# Patient Record
Sex: Female | Born: 1945
Health system: Southern US, Community
[De-identification: ages and names within clinical notes are randomized; demographics above are authoritative.]

## PROBLEM LIST (undated history)

## (undated) DIAGNOSIS — K219 Gastro-esophageal reflux disease without esophagitis: Secondary | ICD-10-CM

## (undated) DIAGNOSIS — M1990 Unspecified osteoarthritis, unspecified site: Secondary | ICD-10-CM

## (undated) DIAGNOSIS — E785 Hyperlipidemia, unspecified: Secondary | ICD-10-CM

## (undated) DIAGNOSIS — I10 Essential (primary) hypertension: Secondary | ICD-10-CM

## (undated) DIAGNOSIS — E538 Deficiency of other specified B group vitamins: Secondary | ICD-10-CM

## (undated) DIAGNOSIS — E119 Type 2 diabetes mellitus without complications: Secondary | ICD-10-CM

## (undated) DIAGNOSIS — E559 Vitamin D deficiency, unspecified: Secondary | ICD-10-CM

## (undated) HISTORY — DX: Vitamin D deficiency, unspecified: E55.9

## (undated) HISTORY — DX: Type 2 diabetes mellitus without complications: E11.9

## (undated) HISTORY — DX: Gastro-esophageal reflux disease without esophagitis: K21.9

## (undated) HISTORY — DX: Unspecified osteoarthritis, unspecified site: M19.90

## (undated) HISTORY — DX: Deficiency of other specified B group vitamins: E53.8

## (undated) HISTORY — DX: Hypercalcemia: E83.52

## (undated) HISTORY — DX: Essential (primary) hypertension: I10

## (undated) HISTORY — DX: Hyperlipidemia, unspecified: E78.5

---

## 1979-07-27 HISTORY — PX: ABDOMINAL HYSTERECTOMY: SHX81

## 1979-07-27 HISTORY — PX: APPENDECTOMY: SHX54

## 2000-07-26 HISTORY — PX: REPAIR DURAL / CSF LEAK: SUR1169

## 2011-10-26 ENCOUNTER — Ambulatory Visit: Payer: Self-pay | Admitting: Internal Medicine

## 2011-10-29 DIAGNOSIS — K219 Gastro-esophageal reflux disease without esophagitis: Secondary | ICD-10-CM | POA: Insufficient documentation

## 2011-10-29 DIAGNOSIS — M4310 Spondylolisthesis, site unspecified: Secondary | ICD-10-CM | POA: Insufficient documentation

## 2012-02-25 DIAGNOSIS — Z87821 Personal history of retained foreign body fully removed: Secondary | ICD-10-CM | POA: Insufficient documentation

## 2012-02-25 DIAGNOSIS — H6122 Impacted cerumen, left ear: Secondary | ICD-10-CM | POA: Insufficient documentation

## 2014-09-04 DIAGNOSIS — H2513 Age-related nuclear cataract, bilateral: Secondary | ICD-10-CM | POA: Diagnosis not present

## 2014-09-04 DIAGNOSIS — E119 Type 2 diabetes mellitus without complications: Secondary | ICD-10-CM | POA: Diagnosis not present

## 2014-09-04 DIAGNOSIS — H40033 Anatomical narrow angle, bilateral: Secondary | ICD-10-CM | POA: Diagnosis not present

## 2015-01-14 ENCOUNTER — Other Ambulatory Visit: Payer: Self-pay

## 2015-01-14 DIAGNOSIS — M19049 Primary osteoarthritis, unspecified hand: Secondary | ICD-10-CM | POA: Insufficient documentation

## 2015-01-14 DIAGNOSIS — E785 Hyperlipidemia, unspecified: Secondary | ICD-10-CM

## 2015-01-14 DIAGNOSIS — N951 Menopausal and female climacteric states: Secondary | ICD-10-CM | POA: Insufficient documentation

## 2015-01-14 DIAGNOSIS — E1165 Type 2 diabetes mellitus with hyperglycemia: Secondary | ICD-10-CM | POA: Insufficient documentation

## 2015-01-14 DIAGNOSIS — D510 Vitamin B12 deficiency anemia due to intrinsic factor deficiency: Secondary | ICD-10-CM | POA: Insufficient documentation

## 2015-01-14 DIAGNOSIS — E1169 Type 2 diabetes mellitus with other specified complication: Secondary | ICD-10-CM | POA: Insufficient documentation

## 2015-01-14 DIAGNOSIS — E559 Vitamin D deficiency, unspecified: Secondary | ICD-10-CM | POA: Insufficient documentation

## 2015-01-14 DIAGNOSIS — M6289 Other specified disorders of muscle: Secondary | ICD-10-CM | POA: Insufficient documentation

## 2015-01-14 DIAGNOSIS — I10 Essential (primary) hypertension: Secondary | ICD-10-CM | POA: Insufficient documentation

## 2015-01-14 DIAGNOSIS — G5600 Carpal tunnel syndrome, unspecified upper limb: Secondary | ICD-10-CM | POA: Insufficient documentation

## 2015-01-14 DIAGNOSIS — IMO0002 Reserved for concepts with insufficient information to code with codable children: Secondary | ICD-10-CM | POA: Insufficient documentation

## 2015-01-14 MED ORDER — MELOXICAM 15 MG PO TABS: 15.0000 mg | ORAL_TABLET | Freq: Every day | ORAL | Status: DC

## 2015-02-11 ENCOUNTER — Other Ambulatory Visit: Payer: Self-pay | Admitting: Internal Medicine

## 2015-02-11 MED ORDER — PRAVASTATIN SODIUM 20 MG PO TABS: 20.0000 mg | ORAL_TABLET | Freq: Every day | ORAL | Status: DC

## 2015-02-17 ENCOUNTER — Other Ambulatory Visit: Payer: Self-pay

## 2015-02-17 MED ORDER — EZETIMIBE 10 MG PO TABS: 10.0000 mg | ORAL_TABLET | Freq: Every day | ORAL | Status: DC

## 2015-02-18 ENCOUNTER — Other Ambulatory Visit: Payer: Self-pay | Admitting: Internal Medicine

## 2015-02-18 MED ORDER — EZETIMIBE 10 MG PO TABS: 10.0000 mg | ORAL_TABLET | Freq: Every day | ORAL | Status: DC

## 2015-03-14 ENCOUNTER — Ambulatory Visit: Payer: Self-pay | Admitting: Internal Medicine

## 2015-03-21 ENCOUNTER — Ambulatory Visit: Payer: Self-pay | Admitting: Internal Medicine

## 2015-03-21 DIAGNOSIS — Z1231 Encounter for screening mammogram for malignant neoplasm of breast: Secondary | ICD-10-CM | POA: Diagnosis not present

## 2015-04-01 ENCOUNTER — Encounter: Payer: Self-pay | Admitting: Internal Medicine

## 2015-04-04 ENCOUNTER — Ambulatory Visit: Payer: Self-pay | Admitting: Internal Medicine

## 2015-04-11 ENCOUNTER — Encounter: Payer: Self-pay | Admitting: Internal Medicine

## 2015-04-11 ENCOUNTER — Ambulatory Visit (INDEPENDENT_AMBULATORY_CARE_PROVIDER_SITE_OTHER): Payer: Medicare PPO | Admitting: Internal Medicine

## 2015-04-11 VITALS — BP 120/70 | HR 64 | Ht 61.5 in | Wt 184.0 lb

## 2015-04-11 DIAGNOSIS — E785 Hyperlipidemia, unspecified: Secondary | ICD-10-CM

## 2015-04-11 DIAGNOSIS — Z23 Encounter for immunization: Secondary | ICD-10-CM

## 2015-04-11 DIAGNOSIS — IMO0002 Reserved for concepts with insufficient information to code with codable children: Secondary | ICD-10-CM

## 2015-04-11 DIAGNOSIS — D510 Vitamin B12 deficiency anemia due to intrinsic factor deficiency: Secondary | ICD-10-CM | POA: Diagnosis not present

## 2015-04-11 DIAGNOSIS — I10 Essential (primary) hypertension: Secondary | ICD-10-CM

## 2015-04-11 DIAGNOSIS — M7070 Other bursitis of hip, unspecified hip: Secondary | ICD-10-CM

## 2015-04-11 DIAGNOSIS — E1165 Type 2 diabetes mellitus with hyperglycemia: Secondary | ICD-10-CM | POA: Diagnosis not present

## 2015-04-11 MED ORDER — CYANOCOBALAMIN 1000 MCG/ML IJ SOLN
1000.0000 ug | INTRAMUSCULAR | Status: DC
  Administered 2015-04-11: 1000 ug via INTRAMUSCULAR

## 2015-04-11 NOTE — Progress Notes (Signed)
Date:  04/11/2015   Name:  Sheila Sandoval   DOB:  May 25, 1946   MRN:  [id number]   Chief Complaint: Diabetes; Hypertension; and Hyperlipidemia Diabetes She presents for her follow-up diabetic visit. She has type 2 diabetes mellitus. Her disease course has been stable. Pertinent negatives for hypoglycemia include no speech difficulty. Pertinent negatives for diabetes include no blurred vision, no chest pain and no fatigue. Symptoms are stable. Current diabetic treatment includes oral agent (dual therapy). She is following a diabetic diet. An ACE inhibitor/angiotensin II receptor blocker is being taken.  Hypertension This is a chronic problem. The current episode started more than 1 year ago. The problem is unchanged. Pertinent negatives include no blurred vision, chest pain, palpitations or shortness of breath.  Hyperlipidemia This is a chronic problem. The problem is controlled. Recent lipid tests were reviewed and are normal. Associated symptoms include myalgias. Pertinent negatives include no chest pain, focal sensory loss, leg pain or shortness of breath.   Hip pain - patient complains of bilateral hip discomfort. On the lateral thigh just below the hip area. She is unable to sleep on either side. The area is tender to touch. It has not responded to anti-inflammatories. She denies any injury or unusual activities. She has not tried ice or heat.  Review of Systems:  Review of Systems  Constitutional: Negative for fever, fatigue and unexpected weight change.  HENT: Negative for hearing loss.   Eyes: Negative for blurred vision.  Respiratory: Negative for choking, shortness of breath and wheezing.   Cardiovascular: Negative for chest pain, palpitations and leg swelling.  Gastrointestinal: Negative for abdominal pain.  Genitourinary: Negative for dysuria and urgency.  Musculoskeletal: Positive for myalgias and arthralgias. Negative for joint swelling.  Skin: Negative for color change.   Neurological: Negative for speech difficulty, light-headedness and numbness.  Psychiatric/Behavioral: Negative for sleep disturbance.    Patient Active Problem List   Diagnosis Date Noted  . Carpal tunnel syndrome 01/14/2015  . Diabetes mellitus type 2, uncontrolled 01/14/2015  . HLD (hyperlipidemia) 01/14/2015  . Essential (primary) hypertension 01/14/2015  . Climacteric 01/14/2015  . Muscle fatigue 01/14/2015  . Addison anemia 01/14/2015  . Arthritis of hand, degenerative 01/14/2015  . Avitaminosis D 01/14/2015    Prior to Admission medications   Medication Sig Start Date End Date Taking? Authorizing Provider  amLODipine (NORVASC) 10 MG tablet Take 1 tablet by mouth daily. 11/15/14  Yes Historical Provider, MD  aspirin 81 MG tablet Take 1 tablet by mouth daily.   Yes Historical Provider, MD  cyanocobalamin (,VITAMIN B-12,) 1000 MCG/ML injection CYANOCOBALAMIN, 1000MCG/ML (Injection Solution)  1 Q3 mo for 0 days  Refills: 0   Ordered :29-Mar-2012  Alison Stalling ;  Started 29-Mar-2012 Active 03/29/12  Yes Historical Provider, MD  diclofenac sodium (VOLTAREN) 1 % GEL Place onto the skin. 04/05/14  Yes Historical Provider, MD  estradiol (ESTRACE) 0.5 MG tablet Take 1 tablet by mouth 3 (three) times a week. 11/22/14  Yes Historical Provider, MD  ezetimibe (ZETIA) 10 MG tablet Take 1 tablet (10 mg total) by mouth daily. 02/18/15  Yes Glean Hess, MD  Ferrous Gluconate 325 (36 FE) MG TABS Take by mouth.   Yes Historical Provider, MD  fluticasone (FLONASE) 50 MCG/ACT nasal spray Place into the nose. 04/05/14  Yes Historical Provider, MD  glimepiride (AMARYL) 2 MG tablet Take 1 tablet by mouth daily. 11/08/14  Yes Historical Provider, MD  glucose blood (ACCU-CHEK AVIVA PLUS) test strip ACCU-CHEK AVIVA PLUS (In Vitro  Strip)  1 (one) Strip Strip daily for 90 days  Quantity: 100;  Refills: 3   Ordered :19-Oct-2013  Halina Maidens M.D.;  Started 19-Oct-2013 Active Comments: dx: 250.02  10/19/13  Yes Historical Provider, MD  ibuprofen (ADVIL,MOTRIN) 800 MG tablet Take 1 tablet by mouth 3 (three) times daily. 08/14/13  Yes Historical Provider, MD  meloxicam (MOBIC) 15 MG tablet Take 1 tablet (15 mg total) by mouth daily. 01/14/15  Yes Glean Hess, MD  pravastatin (PRAVACHOL) 20 MG tablet Take 1 tablet (20 mg total) by mouth daily. 02/11/15  Yes Glean Hess, MD  ramipril (ALTACE) 5 MG capsule Take 1 tablet by mouth daily. 10/08/14  Yes Historical Provider, MD  sitaGLIPtin-metformin (JANUMET) 50-1000 MG per tablet Take 1 tablet by mouth 2 (two) times daily. 11/08/14  Yes Historical Provider, MD  triamterene-hydrochlorothiazide (MAXZIDE-25) 37.5-25 MG per tablet Take 1 tablet by mouth daily. 01/08/13  Yes Historical Provider, MD  ZOSTAVAX 69485 UNT/0.65ML injection  02/04/15   Historical Provider, MD    Allergies  Allergen Reactions  . Atorvastatin     chest tightness  . Tramadol Hcl Nausea And Vomiting    Past Surgical History  Procedure Laterality Date  . Repair dural / csf leak  2002  . Abdominal hysterectomy  1981    Social History  Substance Use Topics  . Smoking status: Never Smoker   . Smokeless tobacco: None  . Alcohol Use: No     Medication list has been reviewed and updated.  Physical Examination:  Physical Exam  Constitutional: She is oriented to person, place, and time. She appears well-developed and well-nourished. No distress.  HENT:  Head: Normocephalic and atraumatic.  Eyes: Conjunctivae are normal. Right eye exhibits no discharge. Left eye exhibits no discharge. No scleral icterus.  Neck: Normal range of motion. Neck supple. No thyromegaly present.  Cardiovascular: Normal rate, regular rhythm and normal heart sounds.   Pulmonary/Chest: Effort normal and breath sounds normal. No respiratory distress.  Musculoskeletal: Normal range of motion. She exhibits no edema or tenderness.  Tender over both lateral hips consistent with mild bursitis.  Range of motion of both hips is normal. Straight leg raise is negative bilaterally.  Neurological: She is alert and oriented to person, place, and time. She has normal reflexes.  Skin: Skin is warm and dry. No rash noted.  Psychiatric: She has a normal mood and affect. Her behavior is normal. Thought content normal.    BP 120/70 mmHg  Pulse 64  Ht 5' 1.5" (1.562 m)  Wt 184 lb (83.462 kg)  BMI 34.21 kg/m2  Assessment and Plan: 1. Essential (primary) hypertension Controlled continue current medication - Basic metabolic panel  2. Diabetes mellitus type 2, uncontrolled Continue oral agents will advise on adjustment if needed - Hemoglobin I6E - Basic metabolic panel  3. HLD (hyperlipidemia) Continue statin therapy  4. Pernicious anemia B12 injection is given today - cyanocobalamin ((VITAMIN B-12)) injection 1,000 mcg; Inject 1 mL (1,000 mcg total) into the muscle every 30 (thirty) days.  5. Need for influenza vaccination - Flu Vaccine QUAD 36+ mos IM  6. Bursitis, hip, unspecified laterality Continue ibuprofen daily Avoid sleeping on the side Use heat or ice for discomfort   Halina Maidens, MD [facility]  04/11/2015

## 2015-04-11 NOTE — Patient Instructions (Signed)
Hip Bursitis Bursitis is a swelling and soreness (inflammation) of a fluid-filled sac (bursa). This sac overlies and protects the joints.  CAUSES   Injury.  Overuse of the muscles surrounding the joint.  Arthritis.  Gout.  Infection.  Cold weather.  Inadequate warm-up and conditioning prior to activities. The cause may not be known.  SYMPTOMS   Mild to severe irritation.  Tenderness and swelling over the outside of the hip.  Pain with motion of the hip.  If the bursa becomes infected, a fever may be present. Redness, tenderness, and warmth will develop over the hip. Symptoms usually lessen in 3 to 4 weeks with treatment, but can come back. TREATMENT If conservative treatment does not work, your caregiver may advise draining the bursa and injecting cortisone into the area. This may speed up the healing process. This may also be used as an initial treatment of choice. HOME CARE INSTRUCTIONS   Apply ice to the affected area for 15-20 minutes every 3 to 4 hours while awake for the first 2 days. Put the ice in a plastic bag and place a towel between the bag of ice and your skin.  Rest the painful joint as much as possible, but continue to put the joint through a normal range of motion at least 4 times per day. When the pain lessens, begin normal, slow movements and usual activities to help prevent stiffness of the hip.  Only take over-the-counter or prescription medicines for pain, discomfort, or fever as directed by your caregiver.  Use crutches to limit weight bearing on the hip joint, if advised.  Elevate your painful hip to reduce swelling. Use pillows for propping and cushioning your legs and hips.  Gentle massage may provide comfort and decrease swelling. SEEK IMMEDIATE MEDICAL CARE IF:   Your pain increases even during treatment, or you are not improving.  You have a fever.  You have heat and inflammation over the involved bursa.  You have any other questions or  concerns. MAKE SURE YOU:   Understand these instructions.  Will watch your condition.  Will get help right away if you are not doing well or get worse. Document Released: 01/01/2002 Document Revised: 10/04/2011 Document Reviewed: 07/31/2008 ExitCare Patient Information 2015 [employer]. This information is not intended to replace advice given to you by your health care Abshir Paolini. Make sure you discuss any questions you have with your health care Donney Caraveo.  

## 2015-04-12 LAB — BASIC METABOLIC PANEL
BUN / CREAT RATIO: 13 (ref 11–26)
BUN: 13 mg/dL (ref 8–27)
CO2: 25 mmol/L (ref 18–29)
Calcium: 10.3 mg/dL (ref 8.7–10.3)
Chloride: 100 mmol/L (ref 97–108)
Creatinine, Ser: 0.99 mg/dL (ref 0.57–1.00)
GFR calc Af Amer: 67 mL/min/{1.73_m2} (ref >59)
GFR calc non Af Amer: 58 mL/min/{1.73_m2} — ABNORMAL LOW (ref >59)
GLUCOSE: 118 mg/dL — AB (ref 65–99)
POTASSIUM: 4.6 mmol/L (ref 3.5–5.2)
SODIUM: 141 mmol/L (ref 134–144)

## 2015-04-12 LAB — HEMOGLOBIN A1C
Est. average glucose Bld gHb Est-mCnc: 180 mg/dL
HEMOGLOBIN A1C: 7.9 % — AB (ref 4.8–5.6)

## 2015-04-29 ENCOUNTER — Other Ambulatory Visit: Payer: Self-pay

## 2015-04-29 MED ORDER — FLUTICASONE PROPIONATE 50 MCG/ACT NA SUSP: 1.0000 | Freq: Every day | NASAL | Status: DC

## 2015-05-08 ENCOUNTER — Other Ambulatory Visit: Payer: Self-pay | Admitting: Internal Medicine

## 2015-05-08 MED ORDER — ACCU-CHEK SOFTCLIX LANCET DEV MISC: Status: DC

## 2015-05-08 MED ORDER — ACCU-CHEK AVIVA PLUS W/DEVICE KIT
1.0000 | PACK | Freq: Two times a day (BID) | Status: DC
Start: 2015-05-08 — End: 2015-05-09

## 2015-05-08 MED ORDER — GLUCOSE BLOOD VI STRP: 1.0000 | ORAL_STRIP | Freq: Two times a day (BID) | Status: DC

## 2015-05-08 MED ORDER — ACCU-CHEK AVIVA VI SOLN: 1.0000 | Freq: Every day | Status: DC | PRN

## 2015-05-09 ENCOUNTER — Other Ambulatory Visit: Payer: Self-pay | Admitting: Internal Medicine

## 2015-05-09 MED ORDER — ACCU-CHEK SOFTCLIX LANCET DEV MISC: 1.0000 | Freq: Two times a day (BID) | Status: DC

## 2015-05-09 MED ORDER — GLUCOSE BLOOD VI STRP: 1.0000 | ORAL_STRIP | Freq: Two times a day (BID) | Status: DC

## 2015-05-09 MED ORDER — ACCU-CHEK AVIVA VI SOLN: 1.0000 | Freq: Every day | Status: DC | PRN

## 2015-05-09 MED ORDER — ACCU-CHEK AVIVA PLUS W/DEVICE KIT: 1.0000 | PACK | Freq: Two times a day (BID) | Status: DC

## 2015-05-27 DIAGNOSIS — H11423 Conjunctival edema, bilateral: Secondary | ICD-10-CM | POA: Diagnosis not present

## 2015-05-27 DIAGNOSIS — H1851 Endothelial corneal dystrophy: Secondary | ICD-10-CM | POA: Diagnosis not present

## 2015-05-30 DIAGNOSIS — D235 Other benign neoplasm of skin of trunk: Secondary | ICD-10-CM | POA: Diagnosis not present

## 2015-05-30 DIAGNOSIS — Z418 Encounter for other procedures for purposes other than remedying health state: Secondary | ICD-10-CM | POA: Diagnosis not present

## 2015-05-30 DIAGNOSIS — D485 Neoplasm of uncertain behavior of skin: Secondary | ICD-10-CM | POA: Diagnosis not present

## 2015-05-30 DIAGNOSIS — S80861A Insect bite (nonvenomous), right lower leg, initial encounter: Secondary | ICD-10-CM | POA: Diagnosis not present

## 2015-06-03 DIAGNOSIS — H40033 Anatomical narrow angle, bilateral: Secondary | ICD-10-CM | POA: Diagnosis not present

## 2015-06-03 DIAGNOSIS — H40031 Anatomical narrow angle, right eye: Secondary | ICD-10-CM | POA: Diagnosis not present

## 2015-06-03 DIAGNOSIS — H40032 Anatomical narrow angle, left eye: Secondary | ICD-10-CM | POA: Diagnosis not present

## 2015-06-03 DIAGNOSIS — H04123 Dry eye syndrome of bilateral lacrimal glands: Secondary | ICD-10-CM | POA: Diagnosis not present

## 2015-06-03 DIAGNOSIS — H1045 Other chronic allergic conjunctivitis: Secondary | ICD-10-CM | POA: Diagnosis not present

## 2015-06-25 DIAGNOSIS — H40033 Anatomical narrow angle, bilateral: Secondary | ICD-10-CM | POA: Diagnosis not present

## 2015-06-25 DIAGNOSIS — Z01818 Encounter for other preprocedural examination: Secondary | ICD-10-CM | POA: Diagnosis not present

## 2015-06-25 DIAGNOSIS — H40032 Anatomical narrow angle, left eye: Secondary | ICD-10-CM | POA: Diagnosis not present

## 2015-06-25 DIAGNOSIS — H40031 Anatomical narrow angle, right eye: Secondary | ICD-10-CM | POA: Diagnosis not present

## 2015-07-09 ENCOUNTER — Other Ambulatory Visit: Payer: Self-pay

## 2015-07-09 MED ORDER — TRIAMTERENE-HCTZ 37.5-25 MG PO TABS: 1.0000 | ORAL_TABLET | Freq: Every day | ORAL | Status: DC

## 2015-07-10 ENCOUNTER — Ambulatory Visit (INDEPENDENT_AMBULATORY_CARE_PROVIDER_SITE_OTHER): Payer: Medicare PPO

## 2015-07-10 DIAGNOSIS — E538 Deficiency of other specified B group vitamins: Secondary | ICD-10-CM | POA: Diagnosis not present

## 2015-07-10 DIAGNOSIS — H40031 Anatomical narrow angle, right eye: Secondary | ICD-10-CM | POA: Diagnosis not present

## 2015-07-10 DIAGNOSIS — H40033 Anatomical narrow angle, bilateral: Secondary | ICD-10-CM | POA: Diagnosis not present

## 2015-07-10 DIAGNOSIS — H40032 Anatomical narrow angle, left eye: Secondary | ICD-10-CM | POA: Diagnosis not present

## 2015-07-10 DIAGNOSIS — E119 Type 2 diabetes mellitus without complications: Secondary | ICD-10-CM | POA: Insufficient documentation

## 2015-07-10 MED ORDER — CYANOCOBALAMIN 1000 MCG/ML IJ SOLN
1000.0000 ug | Freq: Once | INTRAMUSCULAR | Status: AC
  Administered 2015-07-10: 1000 ug via INTRAMUSCULAR

## 2015-07-25 ENCOUNTER — Other Ambulatory Visit: Payer: Self-pay | Admitting: Internal Medicine

## 2015-07-25 MED ORDER — MELOXICAM 15 MG PO TABS: 15.0000 mg | ORAL_TABLET | Freq: Every day | ORAL | Status: DC

## 2015-07-29 ENCOUNTER — Other Ambulatory Visit: Payer: Self-pay | Admitting: Internal Medicine

## 2015-07-29 ENCOUNTER — Other Ambulatory Visit: Payer: Self-pay

## 2015-07-29 MED ORDER — MELOXICAM 15 MG PO TABS: 15.0000 mg | ORAL_TABLET | Freq: Every day | ORAL | Status: DC

## 2015-07-29 MED ORDER — TRIAMTERENE-HCTZ 37.5-25 MG PO TABS: 1.0000 | ORAL_TABLET | Freq: Every day | ORAL | Status: DC

## 2015-07-29 MED ORDER — ESTRADIOL 0.5 MG PO TABS: 0.5000 mg | ORAL_TABLET | ORAL | Status: DC

## 2015-07-30 DIAGNOSIS — H1851 Endothelial corneal dystrophy: Secondary | ICD-10-CM | POA: Diagnosis not present

## 2015-07-30 DIAGNOSIS — H40031 Anatomical narrow angle, right eye: Secondary | ICD-10-CM | POA: Diagnosis not present

## 2015-07-30 DIAGNOSIS — H40032 Anatomical narrow angle, left eye: Secondary | ICD-10-CM | POA: Diagnosis not present

## 2015-07-30 DIAGNOSIS — H2513 Age-related nuclear cataract, bilateral: Secondary | ICD-10-CM | POA: Diagnosis not present

## 2015-07-30 DIAGNOSIS — H269 Unspecified cataract: Secondary | ICD-10-CM | POA: Diagnosis not present

## 2015-07-30 DIAGNOSIS — H40033 Anatomical narrow angle, bilateral: Secondary | ICD-10-CM | POA: Diagnosis not present

## 2015-08-01 DIAGNOSIS — Q019 Encephalocele, unspecified: Secondary | ICD-10-CM | POA: Insufficient documentation

## 2015-08-06 ENCOUNTER — Other Ambulatory Visit: Payer: Self-pay

## 2015-08-06 MED ORDER — DICLOFENAC SODIUM 1 % TD GEL: 2.0000 g | Freq: Four times a day (QID) | TRANSDERMAL | Status: DC

## 2015-08-06 NOTE — Telephone Encounter (Signed)
Received fax from pharmacy requesting medication.  

## 2015-08-14 DIAGNOSIS — H40031 Anatomical narrow angle, right eye: Secondary | ICD-10-CM | POA: Diagnosis not present

## 2015-08-14 DIAGNOSIS — H2513 Age-related nuclear cataract, bilateral: Secondary | ICD-10-CM | POA: Diagnosis not present

## 2015-08-14 DIAGNOSIS — H40032 Anatomical narrow angle, left eye: Secondary | ICD-10-CM | POA: Diagnosis not present

## 2015-08-14 DIAGNOSIS — H1851 Endothelial corneal dystrophy: Secondary | ICD-10-CM | POA: Diagnosis not present

## 2015-08-14 DIAGNOSIS — H40033 Anatomical narrow angle, bilateral: Secondary | ICD-10-CM | POA: Diagnosis not present

## 2015-08-15 ENCOUNTER — Encounter: Payer: Medicare PPO | Admitting: Internal Medicine

## 2015-09-01 ENCOUNTER — Other Ambulatory Visit: Payer: Self-pay | Admitting: Internal Medicine

## 2015-09-01 MED ORDER — EZETIMIBE 10 MG PO TABS: 10.0000 mg | ORAL_TABLET | Freq: Every day | ORAL | Status: DC

## 2015-09-01 MED ORDER — PRAVASTATIN SODIUM 20 MG PO TABS: 20.0000 mg | ORAL_TABLET | Freq: Every day | ORAL | Status: DC

## 2015-09-09 DIAGNOSIS — E119 Type 2 diabetes mellitus without complications: Secondary | ICD-10-CM | POA: Diagnosis not present

## 2015-09-09 DIAGNOSIS — Z79899 Other long term (current) drug therapy: Secondary | ICD-10-CM | POA: Diagnosis not present

## 2015-09-09 DIAGNOSIS — Z1211 Encounter for screening for malignant neoplasm of colon: Secondary | ICD-10-CM | POA: Diagnosis not present

## 2015-09-09 DIAGNOSIS — D125 Benign neoplasm of sigmoid colon: Secondary | ICD-10-CM | POA: Diagnosis not present

## 2015-09-09 DIAGNOSIS — D126 Benign neoplasm of colon, unspecified: Secondary | ICD-10-CM | POA: Diagnosis not present

## 2015-09-09 LAB — HM COLONOSCOPY

## 2015-09-25 DIAGNOSIS — H40031 Anatomical narrow angle, right eye: Secondary | ICD-10-CM | POA: Diagnosis not present

## 2015-09-25 DIAGNOSIS — H40033 Anatomical narrow angle, bilateral: Secondary | ICD-10-CM | POA: Diagnosis not present

## 2015-09-25 DIAGNOSIS — H2513 Age-related nuclear cataract, bilateral: Secondary | ICD-10-CM | POA: Diagnosis not present

## 2015-09-25 DIAGNOSIS — H40032 Anatomical narrow angle, left eye: Secondary | ICD-10-CM | POA: Diagnosis not present

## 2015-10-23 ENCOUNTER — Encounter: Payer: Self-pay | Admitting: Internal Medicine

## 2015-10-23 ENCOUNTER — Ambulatory Visit (INDEPENDENT_AMBULATORY_CARE_PROVIDER_SITE_OTHER): Payer: Medicare PPO | Admitting: Internal Medicine

## 2015-10-23 VITALS — BP 136/64 | HR 72 | Ht 61.5 in | Wt 183.0 lb

## 2015-10-23 DIAGNOSIS — E785 Hyperlipidemia, unspecified: Secondary | ICD-10-CM | POA: Diagnosis not present

## 2015-10-23 DIAGNOSIS — E1100 Type 2 diabetes mellitus with hyperosmolarity without nonketotic hyperglycemic-hyperosmolar coma (NKHHC): Secondary | ICD-10-CM | POA: Diagnosis not present

## 2015-10-23 DIAGNOSIS — Z0000 Encounter for general adult medical examination without abnormal findings: Secondary | ICD-10-CM

## 2015-10-23 DIAGNOSIS — I10 Essential (primary) hypertension: Secondary | ICD-10-CM | POA: Diagnosis not present

## 2015-10-23 DIAGNOSIS — D510 Vitamin B12 deficiency anemia due to intrinsic factor deficiency: Secondary | ICD-10-CM

## 2015-10-23 DIAGNOSIS — R1900 Intra-abdominal and pelvic swelling, mass and lump, unspecified site: Secondary | ICD-10-CM | POA: Diagnosis not present

## 2015-10-23 MED ORDER — CYANOCOBALAMIN 1000 MCG/ML IJ SOLN
1000.0000 ug | INTRAMUSCULAR | Status: DC
  Administered 2015-10-23 – 2015-12-05 (×2): 1000 ug via INTRAMUSCULAR

## 2015-10-23 NOTE — Progress Notes (Signed)
Patient: Sheila Sandoval, Female    DOB: [birth year], 70 y.o.   MRN: [id number] Visit Date: 10/23/2015  Today's Provider: Halina Maidens, MD   Chief Complaint  Patient presents with  . Medicare Wellness   Subjective:    Annual wellness visit [name] is a 70 y.o. female who presents today for her Subsequent Annual Wellness Visit. She feels well. She reports exercising at home with treadmill and twist board. She reports she is sleeping well. Her mammogram was done in August. She has not had a pelvic exam in several years - denies problems.  ----------------------------------------------------------- Diabetes Pertinent negatives for hypoglycemia include no dizziness, headaches, nervousness/anxiousness or tremors. Pertinent negatives for diabetes include no chest pain, no fatigue, no polydipsia and no polyuria. Current diabetic treatment includes oral agent (triple therapy). She is compliant with treatment most of the time. Her breakfast blood glucose is taken between 5-6 am. Her breakfast blood glucose range is generally 110-130 mg/dl.  Hypertension This is a chronic problem. The current episode started more than 1 year ago. The problem is unchanged. The problem is controlled. Pertinent negatives include no chest pain, headaches, palpitations or shortness of breath.  Hyperlipidemia This is a chronic problem. The current episode started more than 1 year ago. The problem is controlled. Recent lipid tests were reviewed and are normal. Pertinent negatives include no chest pain or shortness of breath. Current antihyperlipidemic treatment includes statins and ezetimibe. The current treatment provides significant improvement of lipids.   Lab Results  Component Value Date   HGBA1C 7.9* 04/11/2015    Review of Systems  Constitutional: Negative for fever, chills and fatigue.  HENT: Negative for congestion, hearing loss, tinnitus, trouble swallowing and voice change.   Eyes:  Negative for visual disturbance.  Respiratory: Negative for cough, chest tightness, shortness of breath and wheezing.   Cardiovascular: Negative for chest pain, palpitations and leg swelling.  Gastrointestinal: Negative for vomiting, abdominal pain, diarrhea and constipation.  Endocrine: Negative for polydipsia and polyuria.  Genitourinary: Negative for dysuria, frequency, vaginal bleeding, vaginal discharge, genital sores and vaginal pain.  Musculoskeletal: Positive for arthralgias. Negative for joint swelling and gait problem.  Skin: Negative for color change and rash.  Neurological: Negative for dizziness, tremors, light-headedness and headaches.  Hematological: Negative for adenopathy. Does not bruise/bleed easily.  Psychiatric/Behavioral: Negative for sleep disturbance and dysphoric mood. The patient is not nervous/anxious.     Social History   Social History  . Marital Status: Single    Spouse Name: N/A  . Number of Children: N/A  . Years of Education: N/A   Occupational History  . Not on file.   Social History Main Topics  . Smoking status: Never Smoker   . Smokeless tobacco: Not on file  . Alcohol Use: No  . Drug Use: No  . Sexual Activity: Not on file   Other Topics Concern  . Not on file   Social History Narrative    Patient Active Problem List   Diagnosis Date Noted  . Type 2 diabetes mellitus ([place]) 07/10/2015  . Pernicious anemia 04/11/2015  . Carpal tunnel syndrome 01/14/2015  . Diabetes mellitus type 2, uncontrolled ([place]) 01/14/2015  . HLD (hyperlipidemia) 01/14/2015  . Essential (primary) hypertension 01/14/2015  . Climacteric 01/14/2015  . Muscle fatigue 01/14/2015  . Addison anemia 01/14/2015  . Arthritis of hand, degenerative 01/14/2015  . Avitaminosis D 01/14/2015    Past Surgical History  Procedure Laterality Date  . Repair dural / csf leak  2002  .  Abdominal hysterectomy  1981  . Appendectomy  1981    Her family history includes Cancer  in her father; Diabetes in her mother.    Previous Medications   AMLODIPINE (NORVASC) 10 MG TABLET    Take 1 tablet by mouth daily.   ASPIRIN 81 MG TABLET    Take 1 tablet by mouth daily.   BLOOD GLUCOSE CALIBRATION (ACCU-CHEK AVIVA) SOLN    1 each by In Vitro route daily as needed.   BLOOD GLUCOSE MONITORING SUPPL (ACCU-CHEK AVIVA PLUS) W/DEVICE KIT    1 each by Does not apply route 2 (two) times daily.   CYANOCOBALAMIN (,VITAMIN B-12,) 1000 MCG/ML INJECTION    CYANOCOBALAMIN, 1000MCG/ML (Injection Solution)  1 Q3 mo for 0 days  Refills: 0   Ordered :29-Mar-2012  Alison Stalling ;  Started 29-Mar-2012 Active   DICLOFENAC SODIUM (VOLTAREN) 1 % GEL    Apply 2 g topically 4 (four) times daily.   ESTRADIOL (ESTRACE) 0.5 MG TABLET    Take 1 tablet (0.5 mg total) by mouth 3 (three) times a week.   EZETIMIBE (ZETIA) 10 MG TABLET    Take 1 tablet (10 mg total) by mouth daily.   FERROUS GLUCONATE 325 (36 FE) MG TABS    Take by mouth.   FLUTICASONE (FLONASE) 50 MCG/ACT NASAL SPRAY    Place 1 spray into both nostrils daily.   GLIMEPIRIDE (AMARYL) 2 MG TABLET    Take 1 tablet by mouth daily.   GLUCOSE BLOOD (ACCU-CHEK AVIVA PLUS) TEST STRIP    1 each by Other route 2 (two) times daily.   IBUPROFEN (ADVIL,MOTRIN) 800 MG TABLET    Take 1 tablet by mouth 3 (three) times daily.   LANCET DEVICES (ACCU-CHEK SOFTCLIX) LANCETS    1 each by Other route 2 (two) times daily.   MELOXICAM (MOBIC) 15 MG TABLET    Take 1 tablet (15 mg total) by mouth daily.   PRAVASTATIN (PRAVACHOL) 20 MG TABLET    Take 1 tablet (20 mg total) by mouth daily.   RAMIPRIL (ALTACE) 5 MG CAPSULE    Take 1 tablet by mouth daily.   SITAGLIPTIN-METFORMIN (JANUMET) 50-1000 MG PER TABLET    Take 1 tablet by mouth 2 (two) times daily.   TRIAMTERENE-HYDROCHLOROTHIAZIDE (MAXZIDE-25) 37.5-25 MG TABLET    Take 1 tablet by mouth daily.    Patient Care Team: Glean Hess, MD as PCP - General (Family Medicine)     Objective:   Vitals: BP  136/64 mmHg  Pulse 72  Ht 5' 1.5" (1.562 m)  Wt 183 lb (83.008 kg)  BMI 34.02 kg/m2  Physical Exam  Constitutional: She is oriented to person, place, and time. She appears well-developed and well-nourished. No distress.  HENT:  Head: Normocephalic and atraumatic.  Right Ear: Tympanic membrane and ear canal normal.  Left Ear: Tympanic membrane and ear canal normal.  Nose: Right sinus exhibits no maxillary sinus tenderness. Left sinus exhibits no maxillary sinus tenderness.  Mouth/Throat: Uvula is midline and oropharynx is clear and moist.  Cerumen impaction bilaterally  Eyes: Conjunctivae and EOM are normal. Right eye exhibits no discharge. Left eye exhibits no discharge. No scleral icterus.  Neck: Normal range of motion. Carotid bruit is not present. No erythema present. No thyromegaly present.  Cardiovascular: Normal rate, regular rhythm, normal heart sounds and normal pulses.   Pulmonary/Chest: Effort normal. No respiratory distress. She has no wheezes. Right breast exhibits no mass, no nipple discharge, no skin change and no tenderness.  Left breast exhibits no mass, no nipple discharge, no skin change and no tenderness.  Abdominal: Soft. Bowel sounds are normal. There is no hepatosplenomegaly. There is no tenderness. There is no CVA tenderness.  Genitourinary: Vagina normal. There is no tenderness, lesion or injury on the right labia. There is no tenderness, lesion or injury on the left labia. Right adnexum displays no mass, no tenderness and no fullness. Left adnexum displays no mass, no tenderness and no fullness.  Cervix and uterus absent but firm midline mass ~2 cm palpated but not visualized on speculum exam  Musculoskeletal: Normal range of motion.  Lymphadenopathy:    She has no cervical adenopathy.    She has no axillary adenopathy.  Neurological: She is alert and oriented to person, place, and time. She has normal reflexes. No cranial nerve deficit or sensory deficit.  Skin:  Skin is warm, dry and intact. No rash noted.  Foot exam completed - normal with normal pulses, sensation and skin appearance.  Small left bunion.  Psychiatric: She has a normal mood and affect. Her speech is normal and behavior is normal. Thought content normal.  Nursing note and vitals reviewed.   Activities of Daily Living In your present state of health, do you have any difficulty performing the following activities: 04/11/2015  Hearing? Y  Vision? N  Difficulty concentrating or making decisions? N  Walking or climbing stairs? N  Dressing or bathing? N  Doing errands, shopping? N    Fall Risk Assessment Fall Risk  04/11/2015  Falls in the past year? No      Depression Screen PHQ 2/9 Scores 04/11/2015  PHQ - 2 Score 0    Cognitive Testing - 6-CIT   Correct? Score   What year is it? yes 0 Yes = 0    No = 4  What month is it? yes 0 Yes = 0    No = 3  Remember:     [address], [place], [place]     What time is it? yes 0 Yes = 0    No = 3  Count backwards from 20 to 1 yes 0 Correct = 0    1 error = 2   More than 1 error = 4  Say the months of the year in reverse. yes 0 Correct = 0    1 error = 2   More than 1 error = 4  What address did I ask you to remember? yes 0 Correct = 0  1 error = 2    2 error = 4    3 error = 6    4 error = 8    All wrong = 10       TOTAL SCORE  0/28   Interpretation:  Normal  Normal (0-7) Abnormal (8-28)        Medicare Annual Wellness Visit Summary:  Reviewed patient's Family Medical History Reviewed and updated list of patient's medical providers Assessment of cognitive impairment was done Assessed patient's functional ability Established a written schedule for health screening [place] Completed and Reviewed  Exercise Activities and Dietary recommendations Goals    None      Immunization History  Administered Date(s) Administered  . Influenza,inj,Quad PF,36+ Mos 04/11/2015  . Pneumococcal  Conjugate-13 08/02/2014  . Pneumococcal-Unspecified 07/26/2000, 09/04/2010  . Tdap 09/04/2010    Health Maintenance  Topic Date Due  . Hepatitis C Screening  January 21, 1946  . FOOT EXAM  02/28/1956  .  OPHTHALMOLOGY EXAM  02/28/1956  . COLONOSCOPY  02/28/1996  . DEXA SCAN  02/28/2011  . PNA vac Low Risk Adult (2 of 2 - PPSV23) 09/05/2015  . HEMOGLOBIN A1C  10/09/2015  . INFLUENZA VACCINE  02/24/2016  . MAMMOGRAM  03/20/2017  . TETANUS/TDAP  09/04/2020  . ZOSTAVAX  Addressed     Discussed health benefits of physical activity, and encouraged her to engage in regular exercise appropriate for her age and condition.    ------------------------------------------------------------------------------------------------------------   Assessment & Plan:  1. Medicare annual wellness visit, subsequent Patient will schedule Mammogram in August MAW measures satisfied - POCT urinalysis dipstick  2. Essential (primary) hypertension controlled - CBC with Differential/Platelet  3. Uncontrolled type 2 diabetes mellitus with hyperosmolarity without coma, without long-term current use of insulin ([place]) Continue triple therapy - will advise if med adjustment needed - Microalbumin / creatinine urine ratio - Comprehensive metabolic panel - TSH - Hemoglobin A1c  4. HLD (hyperlipidemia) On appropriate statin therapy - Lipid panel  5. Pelvic mass in female Pelvic US ordered at [facility]  6. Colon cancer screening Recent Colonoscopy and virtual CT colonoscopy done  7. Pernicious anemia - cyanocobalamin ((VITAMIN B-12)) injection 1,000 mcg; Inject 1 mL (1,000 mcg total) into the muscle every 30 (thirty) days.   Halina Maidens, MD [facility]  10/23/2015

## 2015-10-23 NOTE — Patient Instructions (Addendum)
Health Maintenance  Topic Date Due  . Hepatitis C Screening  Sep 07, 1945  . FOOT EXAM  02/28/1956  . OPHTHALMOLOGY EXAM  02/28/1956  . COLONOSCOPY  02/28/1996  . DEXA SCAN  02/28/2011  . PNA vac Low Risk Adult (2 of 2 - PPSV23) 09/05/2015  . HEMOGLOBIN A1C  10/09/2015  . INFLUENZA VACCINE  02/24/2016  . MAMMOGRAM  03/20/2017  . TETANUS/TDAP  09/04/2020  . ZOSTAVAX  Addressed   Breast Self-Awareness Practicing breast self-awareness may pick up problems early, prevent significant medical complications, and possibly save your life. By practicing breast self-awareness, you can become familiar with how your breasts look and feel and if your breasts are changing. This allows you to notice changes early. It can also offer you some reassurance that your breast health is good. One way to learn what is normal for your breasts and whether your breasts are changing is to do a breast self-exam. If you find a lump or something that was not present in the past, it is best to contact your caregiver right away. Other findings that should be evaluated by your caregiver include nipple discharge, especially if it is bloody; skin changes or reddening; areas where the skin seems to be pulled in (retracted); or new lumps and bumps. Breast pain is seldom associated with cancer (malignancy), but should also be evaluated by a caregiver. HOW TO PERFORM A BREAST SELF-EXAM The best time to examine your breasts is 5-7 days after your menstrual period is over. During menstruation, the breasts are lumpier, and it may be more difficult to pick up changes. If you do not menstruate, have reached menopause, or had your uterus removed (hysterectomy), you should examine your breasts at regular intervals, such as monthly. If you are breastfeeding, examine your breasts after a feeding or after using a breast pump. Breast implants do not decrease the risk for lumps or tumors, so continue to perform breast self-exams as recommended. Talk to  your caregiver about how to determine the difference between the implant and breast tissue. Also, talk about the amount of pressure you should use during the exam. Over time, you will become more familiar with the variations of your breasts and more comfortable with the exam. A breast self-exam requires you to remove all your clothes above the waist. 1. Look at your breasts and nipples. Stand in front of a mirror in a room with good lighting. With your hands on your hips, push your hands firmly downward. Look for a difference in shape, contour, and size from one breast to the other (asymmetry). Asymmetry includes puckers, dips, or bumps. Also, look for skin changes, such as reddened or scaly areas on the breasts. Look for nipple changes, such as discharge, dimpling, repositioning, or redness. 2. Carefully feel your breasts. This is best done either in the shower or tub while using soapy water or when flat on your back. Place the arm (on the side of the breast you are examining) above your head. Use the pads (not the fingertips) of your three middle fingers on your opposite hand to feel your breasts. Start in the underarm area and use  inch (2 cm) overlapping circles to feel your breast. Use 3 different levels of pressure (light, medium, and firm pressure) at each circle before moving to the next circle. The light pressure is needed to feel the tissue closest to the skin. The medium pressure will help to feel breast tissue a little deeper, while the firm pressure is needed to  feel the tissue close to the ribs. Continue the overlapping circles, moving downward over the breast until you feel your ribs below your breast. Then, move one finger-width towards the center of the body. Continue to use the  inch (2 cm) overlapping circles to feel your breast as you move slowly up toward the collar bone (clavicle) near the base of the neck. Continue the up and down exam using all 3 pressures until you reach the middle of  the chest. Do this with each breast, carefully feeling for lumps or changes. 3.  Keep a written record with breast changes or normal findings for each breast. By writing this information down, you do not need to depend only on memory for size, tenderness, or location. Write down where you are in your menstrual cycle, if you are still menstruating. Breast tissue can have some lumps or thick tissue. However, see your caregiver if you find anything that concerns you.  SEEK MEDICAL CARE IF:  You see a change in shape, contour, or size of your breasts or nipples.   You see skin changes, such as reddened or scaly areas on the breasts or nipples.   You have an unusual discharge from your nipples.   You feel a new lump or unusually thick areas.    This information is not intended to replace advice given to you by your health care provider. Make sure you discuss any questions you have with your health care provider.   Document Released: 07/12/2005 Document Revised: 06/28/2012 Document Reviewed: 10/27/2011 Elsevier Interactive Patient Education [employer].

## 2015-10-24 ENCOUNTER — Telehealth: Payer: Self-pay

## 2015-10-24 ENCOUNTER — Other Ambulatory Visit: Payer: Self-pay | Admitting: Internal Medicine

## 2015-10-24 LAB — COMPREHENSIVE METABOLIC PANEL
A/G RATIO: 1.8 (ref 1.2–2.2)
ALK PHOS: 72 IU/L (ref 39–117)
ALT: 16 IU/L (ref 0–32)
AST: 20 IU/L (ref 0–40)
Albumin: 4.5 g/dL (ref 3.6–4.8)
BILIRUBIN TOTAL: 0.3 mg/dL (ref 0.0–1.2)
BUN/Creatinine Ratio: 16 (ref 11–26)
BUN: 15 mg/dL (ref 8–27)
CALCIUM: 10.1 mg/dL (ref 8.7–10.3)
CHLORIDE: 100 mmol/L (ref 96–106)
CO2: 24 mmol/L (ref 18–29)
Creatinine, Ser: 0.92 mg/dL (ref 0.57–1.00)
GFR calc Af Amer: 73 mL/min/{1.73_m2} (ref >59)
GFR calc non Af Amer: 64 mL/min/{1.73_m2} (ref >59)
Globulin, Total: 2.5 g/dL (ref 1.5–4.5)
Glucose: 125 mg/dL — ABNORMAL HIGH (ref 65–99)
POTASSIUM: 4.5 mmol/L (ref 3.5–5.2)
Sodium: 142 mmol/L (ref 134–144)
Total Protein: 7 g/dL (ref 6.0–8.5)

## 2015-10-24 LAB — LIPID PANEL
CHOLESTEROL TOTAL: 139 mg/dL (ref 100–199)
Chol/HDL Ratio: 2.3 ratio units (ref 0.0–4.4)
HDL: 60 mg/dL (ref >39)
LDL Calculated: 65 mg/dL (ref 0–99)
TRIGLYCERIDES: 71 mg/dL (ref 0–149)
VLDL Cholesterol Cal: 14 mg/dL (ref 5–40)

## 2015-10-24 LAB — CBC WITH DIFFERENTIAL/PLATELET
BASOS: 1 %
Basophils Absolute: 0 10*3/uL (ref 0.0–0.2)
EOS (ABSOLUTE): 0.1 10*3/uL (ref 0.0–0.4)
Eos: 2 %
Hematocrit: 36.6 % (ref 34.0–46.6)
Hemoglobin: 11.3 g/dL (ref 11.1–15.9)
IMMATURE GRANULOCYTES: 0 %
Immature Grans (Abs): 0 10*3/uL (ref 0.0–0.1)
Lymphocytes Absolute: 2.2 10*3/uL (ref 0.7–3.1)
Lymphs: 35 %
MCH: 22.2 pg — ABNORMAL LOW (ref 26.6–33.0)
MCHC: 30.9 g/dL — AB (ref 31.5–35.7)
MCV: 72 fL — AB (ref 79–97)
MONOS ABS: 0.3 10*3/uL (ref 0.1–0.9)
Monocytes: 5 %
NEUTROS PCT: 57 %
Neutrophils Absolute: 3.6 10*3/uL (ref 1.4–7.0)
PLATELETS: 297 10*3/uL (ref 150–379)
RBC: 5.08 x10E6/uL (ref 3.77–5.28)
RDW: 16 % — AB (ref 12.3–15.4)
WBC: 6.2 10*3/uL (ref 3.4–10.8)

## 2015-10-24 LAB — HEMOGLOBIN A1C
ESTIMATED AVERAGE GLUCOSE: 166 mg/dL
Hgb A1c MFr Bld: 7.4 % — ABNORMAL HIGH (ref 4.8–5.6)

## 2015-10-24 LAB — TSH: TSH: 2.11 u[IU]/mL (ref 0.450–4.500)

## 2015-10-24 MED ORDER — RAMIPRIL 5 MG PO CAPS: 5.0000 mg | ORAL_CAPSULE | Freq: Every day | ORAL | Status: DC

## 2015-10-24 NOTE — Telephone Encounter (Signed)
Spoke with patient. Patient advised of all results and verbalized understanding. Will call back with any future questions or concerns. MAH  

## 2015-10-24 NOTE — Telephone Encounter (Signed)
-----   Message from Glean Hess, MD sent at 10/24/2015  8:00 AM EDT ----- DM is improved.  All other labs are normal.  Continue same medication.

## 2015-10-31 DIAGNOSIS — Z90710 Acquired absence of both cervix and uterus: Secondary | ICD-10-CM | POA: Diagnosis not present

## 2015-10-31 DIAGNOSIS — R1909 Other intra-abdominal and pelvic swelling, mass and lump: Secondary | ICD-10-CM | POA: Diagnosis not present

## 2015-11-03 ENCOUNTER — Encounter: Payer: Self-pay | Admitting: Internal Medicine

## 2015-11-03 ENCOUNTER — Telehealth: Payer: Self-pay | Admitting: Internal Medicine

## 2015-11-03 NOTE — Telephone Encounter (Signed)
Please call patient to report that her ultrasound is normal.  There is no ovarian abnormality.  No further testing needs to be done.

## 2015-11-03 NOTE — Telephone Encounter (Signed)
Advised patient of results.  

## 2015-12-05 ENCOUNTER — Ambulatory Visit (INDEPENDENT_AMBULATORY_CARE_PROVIDER_SITE_OTHER): Payer: Medicare PPO

## 2015-12-05 VITALS — Resp 16 | Ht 61.5 in | Wt 183.0 lb

## 2015-12-05 DIAGNOSIS — E538 Deficiency of other specified B group vitamins: Secondary | ICD-10-CM

## 2015-12-05 DIAGNOSIS — H6123 Impacted cerumen, bilateral: Secondary | ICD-10-CM | POA: Diagnosis not present

## 2015-12-09 ENCOUNTER — Other Ambulatory Visit: Payer: Self-pay | Admitting: Internal Medicine

## 2015-12-09 MED ORDER — AMLODIPINE BESYLATE 10 MG PO TABS: 10.0000 mg | ORAL_TABLET | Freq: Every day | ORAL | Status: DC

## 2016-01-09 ENCOUNTER — Ambulatory Visit (INDEPENDENT_AMBULATORY_CARE_PROVIDER_SITE_OTHER): Payer: Medicare PPO | Admitting: Internal Medicine

## 2016-01-09 ENCOUNTER — Encounter: Payer: Self-pay | Admitting: Internal Medicine

## 2016-01-09 VITALS — BP 136/82 | HR 84 | Temp 98.1°F | Resp 16 | Ht 61.5 in | Wt 184.0 lb

## 2016-01-09 DIAGNOSIS — E1100 Type 2 diabetes mellitus with hyperosmolarity without nonketotic hyperglycemic-hyperosmolar coma (NKHHC): Secondary | ICD-10-CM

## 2016-01-09 DIAGNOSIS — M5432 Sciatica, left side: Secondary | ICD-10-CM

## 2016-01-09 DIAGNOSIS — I10 Essential (primary) hypertension: Secondary | ICD-10-CM | POA: Diagnosis not present

## 2016-01-09 DIAGNOSIS — D510 Vitamin B12 deficiency anemia due to intrinsic factor deficiency: Secondary | ICD-10-CM | POA: Diagnosis not present

## 2016-01-09 LAB — POCT URINALYSIS DIPSTICK
Bilirubin, UA: NEGATIVE
Blood, UA: NEGATIVE
GLUCOSE UA: NEGATIVE
Ketones, UA: NEGATIVE
LEUKOCYTES UA: NEGATIVE
Nitrite, UA: NEGATIVE
PROTEIN UA: NEGATIVE
Spec Grav, UA: 1.01
UROBILINOGEN UA: 0.2
pH, UA: 8

## 2016-01-09 MED ORDER — CYANOCOBALAMIN 1000 MCG/ML IJ SOLN
1000.0000 ug | INTRAMUSCULAR | Status: DC
  Administered 2016-01-09 – 2016-08-09 (×3): 1000 ug via INTRAMUSCULAR

## 2016-01-09 MED ORDER — METHYLPREDNISOLONE 4 MG PO TBPK: ORAL_TABLET | ORAL | Status: DC

## 2016-01-09 NOTE — Progress Notes (Signed)
Date:  01/09/2016   Name:  Sheila Sandoval   DOB:  [birth year]   MRN:  [id number]   Chief Complaint: Leg Pain; Urinary Frequency; and B12 Injection Leg Pain  There was no injury mechanism. The pain is present in the left leg and left hip. The quality of the pain is described as shooting and aching. The pain is moderate. The pain has been fluctuating since onset. Pertinent negatives include no numbness or tingling. The symptoms are aggravated by movement and weight bearing. She has tried acetaminophen for the symptoms. The treatment provided mild relief.  Urinary Frequency  This is a recurrent problem. The problem occurs intermittently. The patient is experiencing no pain. There has been no fever. Associated symptoms include frequency. Pertinent negatives include no chills, hematuria or nausea. She has tried increased fluids for the symptoms.  Diabetes She presents for her follow-up diabetic visit. She has type 2 diabetes mellitus. Pertinent negatives for hypoglycemia include no headaches, nervousness/anxiousness or tremors. Pertinent negatives for diabetes include no chest pain and no weakness.   B12 deficiency - due for B12 injection today.  Lab Results  Component Value Date   HGBA1C 7.4* 10/23/2015    Review of Systems  Constitutional: Negative for fever and chills.  HENT: Negative for ear pain and hearing loss.   Eyes: Negative for visual disturbance.  Respiratory: Negative for shortness of breath.   Cardiovascular: Negative for chest pain and palpitations.  Gastrointestinal: Negative for nausea.  Genitourinary: Positive for frequency. Negative for hematuria.  Musculoskeletal: Positive for arthralgias. Negative for myalgias, joint swelling and gait problem.  Skin: Negative for rash.  Neurological: Negative for tingling, tremors, weakness, numbness and headaches.  Psychiatric/Behavioral: Negative for dysphoric mood. The patient is not nervous/anxious.     Patient Active  Problem List   Diagnosis Date Noted  . Uncontrolled type 2 diabetes mellitus with hyperosmolarity without coma, without long-term current use of insulin ([place]) 10/23/2015  . Encephalocele ([place]) 08/01/2015  . Pernicious anemia 04/11/2015  . Carpal tunnel syndrome 01/14/2015  . HLD (hyperlipidemia) 01/14/2015  . Essential (primary) hypertension 01/14/2015  . Climacteric 01/14/2015  . Muscle fatigue 01/14/2015  . Arthritis of hand, degenerative 01/14/2015  . Avitaminosis D 01/14/2015    Prior to Admission medications   Medication Sig Start Date End Date Taking? Authorizing Provider  amLODipine (NORVASC) 10 MG tablet Take 1 tablet (10 mg total) by mouth daily. 12/09/15  Yes Glean Hess, MD  aspirin 81 MG tablet Take 1 tablet by mouth daily.   Yes Historical Provider, MD  Blood Glucose Calibration (ACCU-CHEK AVIVA) SOLN 1 each by In Vitro route daily as needed. 05/09/15  Yes Glean Hess, MD  Blood Glucose Monitoring Suppl (ACCU-CHEK AVIVA PLUS) W/DEVICE KIT 1 each by Does not apply route 2 (two) times daily. 05/09/15  Yes Glean Hess, MD  diclofenac sodium (VOLTAREN) 1 % GEL Apply 2 g topically 4 (four) times daily. 08/06/15  Yes Glean Hess, MD  estradiol (ESTRACE) 0.5 MG tablet Take 1 tablet (0.5 mg total) by mouth 3 (three) times a week. 07/29/15  Yes Glean Hess, MD  ezetimibe (ZETIA) 10 MG tablet Take 1 tablet (10 mg total) by mouth daily. 09/01/15  Yes Glean Hess, MD  Ferrous Gluconate 325 (36 FE) MG TABS Take by mouth.   Yes Historical Provider, MD  fluticasone (FLONASE) 50 MCG/ACT nasal spray Place 1 spray into both nostrils daily. 04/29/15  Yes Glean Hess, MD  glimepiride (  AMARYL) 2 MG tablet Take 1 tablet by mouth daily. 11/08/14  Yes Historical Provider, MD  glucose blood (ACCU-CHEK AVIVA PLUS) test strip 1 each by Other route 2 (two) times daily. 05/09/15  Yes Glean Hess, MD  ibuprofen (ADVIL,MOTRIN) 800 MG tablet Take 1 tablet by mouth 3 (three)  times daily. 08/14/13  Yes Historical Provider, MD  Lancet Devices [facility]) lancets 1 each by Other route 2 (two) times daily. 05/09/15  Yes Glean Hess, MD  meloxicam (MOBIC) 15 MG tablet Take 1 tablet (15 mg total) by mouth daily. 07/29/15  Yes Glean Hess, MD  pravastatin (PRAVACHOL) 20 MG tablet Take 1 tablet (20 mg total) by mouth daily. 09/01/15  Yes Glean Hess, MD  ramipril (ALTACE) 5 MG capsule Take 1 capsule (5 mg total) by mouth daily. 10/24/15  Yes Glean Hess, MD  sitaGLIPtin-metformin (JANUMET) 50-1000 MG per tablet Take 1 tablet by mouth 2 (two) times daily. 11/08/14  Yes Historical Provider, MD  triamterene-hydrochlorothiazide (MAXZIDE-25) 37.5-25 MG tablet Take 1 tablet by mouth daily. 07/29/15  Yes Glean Hess, MD    Allergies  Allergen Reactions  . Atorvastatin     chest tightness  . Tramadol Hcl Nausea And Vomiting    Past Surgical History  Procedure Laterality Date  . Repair dural / csf leak  2002  . Abdominal hysterectomy  1981  . Appendectomy  1981    Social History  Substance Use Topics  . Smoking status: Never Smoker   . Smokeless tobacco: None  . Alcohol Use: No     Medication list has been reviewed and updated.   Physical Exam  Constitutional: She is oriented to person, place, and time. She appears well-developed. No distress.  HENT:  Head: Normocephalic and atraumatic.  Cardiovascular: Normal rate, regular rhythm and normal heart sounds.   Pulmonary/Chest: Effort normal and breath sounds normal. No respiratory distress.  Abdominal: There is no tenderness.  Musculoskeletal: She exhibits no edema.       Left hip: She exhibits tenderness (over posterior hip ). She exhibits normal range of motion, normal strength and no swelling.  Neurological: She is alert and oriented to person, place, and time. She has normal strength. No sensory deficit.  Reflex Scores:      Patellar reflexes are 1+ on the right side and 1+ on the  left side.      Achilles reflexes are 2+ on the right side and 2+ on the left side. Skin: Skin is warm and dry. No rash noted.  Psychiatric: She has a normal mood and affect. Her behavior is normal. Thought content normal.  Nursing note and vitals reviewed.   BP 136/82 mmHg  Pulse 84  Temp(Src) 98.1 F (36.7 C) (Oral)  Resp 16  Ht 5' 1.5" (1.562 m)  Wt 184 lb (83.462 kg)  BMI 34.21 kg/m2  SpO2 100%  Assessment and Plan: 1. Essential (primary) hypertension Controlled Urine normal - POCT urinalysis dipstick  2. Uncontrolled type 2 diabetes mellitus with hyperosmolarity without coma, without long-term current use of insulin ([place]) controlled  3. Sciatica of left side Use heat, tylenol after prednisone taper Ortho consult if persistent - methylPREDNISolone (MEDROL DOSEPAK) 4 MG TBPK tablet; Take 6 pills on day 1 the 5 pills day 2 then 4 pills day 3 then 3 pills day 4 then 2 pills day 5 then one pills day 6 then stop  Dispense: 21 tablet; Refill: 0  4. Pernicious anemia - cyanocobalamin ((VITAMIN  B-12)) injection 1,000 mcg; Inject 1 mL (1,000 mcg total) into the muscle every 30 (thirty) days.   Halina Maidens, MD [facility]  01/09/2016

## 2016-01-09 NOTE — Patient Instructions (Signed)

## 2016-01-14 ENCOUNTER — Telehealth: Payer: Self-pay

## 2016-01-14 NOTE — Telephone Encounter (Signed)
Meds not helping leg pain methylprednisolone. Is there a different Rx we can try?

## 2016-01-14 NOTE — Telephone Encounter (Signed)
She needs to consult Orthopedics if not improved.

## 2016-01-15 ENCOUNTER — Other Ambulatory Visit: Payer: Self-pay

## 2016-01-15 DIAGNOSIS — M79605 Pain in left leg: Secondary | ICD-10-CM

## 2016-01-15 NOTE — Telephone Encounter (Signed)
Will enter ref and patient awaiting someone to schedule.

## 2016-01-16 DIAGNOSIS — M4310 Spondylolisthesis, site unspecified: Secondary | ICD-10-CM | POA: Diagnosis not present

## 2016-01-16 DIAGNOSIS — M7061 Trochanteric bursitis, right hip: Secondary | ICD-10-CM | POA: Diagnosis not present

## 2016-02-18 ENCOUNTER — Other Ambulatory Visit: Payer: Self-pay | Admitting: Internal Medicine

## 2016-03-05 ENCOUNTER — Ambulatory Visit: Payer: Medicare PPO | Admitting: Internal Medicine

## 2016-03-05 ENCOUNTER — Encounter: Payer: Self-pay | Admitting: Internal Medicine

## 2016-03-05 ENCOUNTER — Ambulatory Visit (INDEPENDENT_AMBULATORY_CARE_PROVIDER_SITE_OTHER): Payer: Medicare PPO | Admitting: Internal Medicine

## 2016-03-05 ENCOUNTER — Other Ambulatory Visit: Payer: Self-pay

## 2016-03-05 VITALS — BP 138/84 | HR 70 | Resp 16 | Ht 61.5 in | Wt 183.0 lb

## 2016-03-05 DIAGNOSIS — M67471 Ganglion, right ankle and foot: Secondary | ICD-10-CM | POA: Diagnosis not present

## 2016-03-05 DIAGNOSIS — D510 Vitamin B12 deficiency anemia due to intrinsic factor deficiency: Secondary | ICD-10-CM

## 2016-03-05 DIAGNOSIS — E1100 Type 2 diabetes mellitus with hyperosmolarity without nonketotic hyperglycemic-hyperosmolar coma (NKHHC): Secondary | ICD-10-CM | POA: Diagnosis not present

## 2016-03-05 DIAGNOSIS — E785 Hyperlipidemia, unspecified: Secondary | ICD-10-CM

## 2016-03-05 DIAGNOSIS — Z1239 Encounter for other screening for malignant neoplasm of breast: Secondary | ICD-10-CM | POA: Diagnosis not present

## 2016-03-05 DIAGNOSIS — I10 Essential (primary) hypertension: Secondary | ICD-10-CM

## 2016-03-05 MED ORDER — ROSUVASTATIN CALCIUM 5 MG PO TABS: 5.0000 mg | ORAL_TABLET | Freq: Every day | ORAL | 3 refills | Status: DC

## 2016-03-05 MED ORDER — CYANOCOBALAMIN 1000 MCG/ML IJ SOLN
1000.0000 ug | Freq: Once | INTRAMUSCULAR | Status: AC
  Administered 2016-03-05: 1000 ug via INTRAMUSCULAR

## 2016-03-05 MED ORDER — GLIMEPIRIDE 2 MG PO TABS: 2.0000 mg | ORAL_TABLET | Freq: Every day | ORAL | 1 refills | Status: DC

## 2016-03-05 NOTE — Progress Notes (Signed)
  Date:  03/05/2016   Name:  Sheila Sandoval   DOB:  07/27/1945   MRN:  [id number]   Chief Complaint: Diabetes; Hypertension; Anemia (Wants B12.); Cyst (right foot); and Nevus (Left side ) Lump - has lump on top of right foot. Mildly tender.  It has been present for several months and getting larger.   Diabetes  She presents for her follow-up diabetic visit. She has type 2 diabetes mellitus. Her disease course has been stable. There are no hypoglycemic associated symptoms. Pertinent negatives for hypoglycemia include no headaches or tremors. Pertinent negatives for diabetes include no blurred vision, no chest pain, no fatigue, no foot paresthesias, no polydipsia and no polyuria. Symptoms are stable. Current diabetic treatment includes oral agent (triple therapy). There is no change in her home blood glucose trend.  Hypertension  This is a chronic problem. The current episode started more than 1 year ago. The problem is unchanged. The problem is controlled. Pertinent negatives include no blurred vision, chest pain, headaches, palpitations or shortness of breath. Risk factors for coronary artery disease include diabetes mellitus and dyslipidemia.  Hyperlipidemia  This is a chronic problem. The problem is controlled. Recent lipid tests were reviewed and are normal. Exacerbating diseases include diabetes. Pertinent negatives include no chest pain or shortness of breath. Current antihyperlipidemic treatment includes statins and ezetimibe. The current treatment provides significant improvement of lipids. Compliance problems include medication side effects (has itching since changing generic pravachol).     Lab Results  Component Value Date   HGBA1C 7.4 (H) 10/23/2015   Lab Results  Component Value Date   CHOL 139 10/23/2015   HDL 60 10/23/2015   LDLCALC 65 10/23/2015   TRIG 71 10/23/2015   CHOLHDL 2.3 10/23/2015   Lab Results  Component Value Date   CREATININE 0.92 10/23/2015      Review of Systems  Constitutional: Negative for appetite change, fatigue, fever and unexpected weight change.  HENT: Negative for tinnitus and trouble swallowing.   Eyes: Negative for blurred vision and visual disturbance.  Respiratory: Negative for cough, chest tightness and shortness of breath.   Cardiovascular: Negative for chest pain, palpitations and leg swelling.  Gastrointestinal: Negative for abdominal pain.  Endocrine: Negative for polydipsia and polyuria.  Genitourinary: Negative for dysuria and hematuria.  Musculoskeletal: Negative for arthralgias.  Skin: Positive for rash (left leg).  Neurological: Negative for tremors, numbness and headaches.  Psychiatric/Behavioral: Negative for dysphoric mood.    Patient Active Problem List   Diagnosis Date Noted  . Uncontrolled type 2 diabetes mellitus with hyperosmolarity without coma, without long-term current use of insulin ([facility]) 10/23/2015  . Encephalocele ([facility]) 08/01/2015  . Pernicious anemia 04/11/2015  . Carpal tunnel syndrome 01/14/2015  . HLD (hyperlipidemia) 01/14/2015  . Essential (primary) hypertension 01/14/2015  . Climacteric 01/14/2015  . Muscle fatigue 01/14/2015  . Arthritis of hand, degenerative 01/14/2015  . Avitaminosis D 01/14/2015    Prior to Admission medications   Medication Sig Start Date End Date Taking? Authorizing Provider  amLODipine (NORVASC) 10 MG tablet Take 1 tablet (10 mg total) by mouth daily. 12/09/15   Laura H Berglund, MD  aspirin 81 MG tablet Take 1 tablet by mouth daily.    Historical Provider, MD  Blood Glucose Calibration (ACCU-CHEK AVIVA) SOLN 1 each by In Vitro route daily as needed. 05/09/15   Laura H Berglund, MD  Blood Glucose Monitoring Suppl (ACCU-CHEK AVIVA PLUS) W/DEVICE KIT 1 each by Does not apply route 2 (two) times daily.   05/09/15   Laura H Berglund, MD  diclofenac sodium (VOLTAREN) 1 % GEL Apply 2 g topically 4 (four) times daily. 08/06/15   Laura H Berglund, MD   estradiol (ESTRACE) 0.5 MG tablet Take 1 tablet (0.5 mg total) by mouth 3 (three) times a week. 07/29/15   Laura H Berglund, MD  ezetimibe (ZETIA) 10 MG tablet Take 1 tablet (10 mg total) by mouth daily. 09/01/15   Laura H Berglund, MD  Ferrous Gluconate 325 (36 FE) MG TABS Take by mouth.    Historical Provider, MD  fluticasone (FLONASE) 50 MCG/ACT nasal spray Place 1 spray into both nostrils daily. 04/29/15   Laura H Berglund, MD  glimepiride (AMARYL) 2 MG tablet Take 1 tablet by mouth daily. 11/08/14   Historical Provider, MD  glucose blood (ACCU-CHEK AVIVA PLUS) test strip 1 each by Other route 2 (two) times daily. 05/09/15   Laura H Berglund, MD  ibuprofen (ADVIL,MOTRIN) 800 MG tablet Take 1 tablet by mouth 3 (three) times daily. 08/14/13   Historical Provider, MD  Lancet Devices (ACCU-CHEK SOFTCLIX) lancets 1 each by Other route 2 (two) times daily. 05/09/15   Laura H Berglund, MD  meloxicam (MOBIC) 15 MG tablet Take 1 tablet (15 mg total) by mouth daily. 07/29/15   Laura H Berglund, MD  methylPREDNISolone (MEDROL DOSEPAK) 4 MG TBPK tablet Take 6 pills on day 1 the 5 pills day 2 then 4 pills day 3 then 3 pills day 4 then 2 pills day 5 then one pills day 6 then stop 01/09/16   Laura H Berglund, MD  pravastatin (PRAVACHOL) 20 MG tablet Take 1 tablet (20 mg total) by mouth daily. 09/01/15   Laura H Berglund, MD  ramipril (ALTACE) 5 MG capsule Take 1 capsule (5 mg total) by mouth daily. 10/24/15   Laura H Berglund, MD  sitaGLIPtin-metformin (JANUMET) 50-1000 MG per tablet Take 1 tablet by mouth 2 (two) times daily. 11/08/14   Historical Provider, MD  triamterene-hydrochlorothiazide (MAXZIDE-25) 37.5-25 MG tablet TAKE 1 TABLET BY MOUTH ONCE DAILY 02/18/16   Laura H Berglund, MD    Allergies  Allergen Reactions  . Atorvastatin     chest tightness  . Tramadol Hcl Nausea And Vomiting    Past Surgical History:  Procedure Laterality Date  . ABDOMINAL HYSTERECTOMY  1981  . APPENDECTOMY  1981  . REPAIR DURAL  / CSF LEAK  2002    Social History  Substance Use Topics  . Smoking status: Never Smoker  . Smokeless tobacco: Not on file  . Alcohol use No     Medication list has been reviewed and updated.   Physical Exam  Constitutional: She is oriented to person, place, and time. She appears well-developed. No distress.  HENT:  Head: Normocephalic and atraumatic.  Neck: Normal range of motion. Neck supple. Carotid bruit is not present.  Cardiovascular: Normal rate, regular rhythm and normal heart sounds.   Pulmonary/Chest: Effort normal and breath sounds normal. No respiratory distress.  Abdominal: Soft. Bowel sounds are normal.  Musculoskeletal: Normal range of motion.  Ganglion cyst on top of right foot - mildly tender  Neurological: She is alert and oriented to person, place, and time.  Skin: Skin is warm and dry. No rash noted.     Psychiatric: She has a normal mood and affect. Her behavior is normal. Thought content normal.  Nursing note and vitals reviewed.   BP 138/84 (BP Location: Left Arm, Patient Position: Sitting, Cuff Size: Large)   Pulse 70     Resp 16   Ht 5' 1.5" (1.562 m)   Wt 183 lb (83 kg)   SpO2 97%   BMI 34.02 kg/m   Assessment and Plan: 1. Uncontrolled type 2 diabetes mellitus with hyperosmolarity without coma, without long-term current use of insulin ([facility]) Continue oral agents and diet - Hemoglobin A1c - glimepiride (AMARYL) 2 MG tablet; Take 1 tablet (2 mg total) by mouth daily.  Dispense: 90 tablet; Refill: 1  2. Essential (primary) hypertension controlled  3. Ganglion cyst of right foot Consult podiatry  4. HLD (hyperlipidemia) On appropriate statin therapy - rosuvastatin (CRESTOR) 5 MG tablet; Take 1 tablet (5 mg total) by mouth daily.  Dispense: 90 tablet; Refill: 3   Laura Berglund, MD [facility]  03/05/2016  

## 2016-03-06 LAB — HEMOGLOBIN A1C
ESTIMATED AVERAGE GLUCOSE: 189 mg/dL
Hgb A1c MFr Bld: 8.2 % — ABNORMAL HIGH (ref 4.8–5.6)

## 2016-03-10 ENCOUNTER — Other Ambulatory Visit: Payer: Self-pay | Admitting: Internal Medicine

## 2016-03-12 ENCOUNTER — Telehealth: Payer: Self-pay

## 2016-03-12 ENCOUNTER — Other Ambulatory Visit: Payer: Self-pay | Admitting: Internal Medicine

## 2016-03-12 MED ORDER — EMPAGLIFLOZIN 25 MG PO TABS: 25.0000 mg | ORAL_TABLET | Freq: Every day | ORAL | 5 refills | Status: DC

## 2016-03-12 NOTE — Telephone Encounter (Addendum)
Pharmacy said taking Janument and jardiance and glimiperide is dangerous and patient wants to know if she can diet instead. Per Dr. Jacinto Reap we ned to add insulin if this is the case.

## 2016-03-12 NOTE — Telephone Encounter (Signed)
It is time to add another medication.  Jardiance 25 mg once a day.  Continue taking all other medications.  I have sent it to the pharmacy.

## 2016-03-12 NOTE — Telephone Encounter (Signed)
Advised 

## 2016-03-15 NOTE — Telephone Encounter (Signed)
Then she needs to come in to get started on injections.  Please schedule her for tomorrow.

## 2016-03-15 NOTE — Telephone Encounter (Signed)
Pharmacy called and said that she should not mix

## 2016-03-16 NOTE — Telephone Encounter (Signed)
Sheila Sandoval could not get through to her on the phone.  Please try again.

## 2016-03-16 NOTE — Telephone Encounter (Signed)
Advised we will have to do insulin. Patient will do the Jardiance instead and said they told her not to take ith other meds at once.

## 2016-03-31 ENCOUNTER — Other Ambulatory Visit: Payer: Self-pay | Admitting: Internal Medicine

## 2016-03-31 ENCOUNTER — Telehealth: Payer: Self-pay

## 2016-03-31 MED ORDER — SITAGLIPTIN PHOS-METFORMIN HCL 50-1000 MG PO TABS: 1.0000 | ORAL_TABLET | Freq: Two times a day (BID) | ORAL | 3 refills | Status: DC

## 2016-03-31 MED ORDER — SITAGLIPTIN PHOS-METFORMIN HCL 50-1000 MG PO TABS: 1.0000 | ORAL_TABLET | Freq: Two times a day (BID) | ORAL | 5 refills | Status: DC

## 2016-03-31 NOTE — Telephone Encounter (Signed)
Patient would like to speak with you about her medication, janumet , patient is having trouble getting prescription filled. Pharmacy is [facility] in [place], [phone]. Mail order will not get to her in time of running out and [facility] does not have a script on file for this medication. Please advise

## 2016-04-23 DIAGNOSIS — Z1231 Encounter for screening mammogram for malignant neoplasm of breast: Secondary | ICD-10-CM | POA: Diagnosis not present

## 2016-04-27 DIAGNOSIS — M7521 Bicipital tendinitis, right shoulder: Secondary | ICD-10-CM | POA: Diagnosis not present

## 2016-04-28 DIAGNOSIS — M7541 Impingement syndrome of right shoulder: Secondary | ICD-10-CM | POA: Diagnosis not present

## 2016-04-30 DIAGNOSIS — H40033 Anatomical narrow angle, bilateral: Secondary | ICD-10-CM | POA: Diagnosis not present

## 2016-04-30 DIAGNOSIS — H5203 Hypermetropia, bilateral: Secondary | ICD-10-CM | POA: Diagnosis not present

## 2016-04-30 DIAGNOSIS — H04123 Dry eye syndrome of bilateral lacrimal glands: Secondary | ICD-10-CM | POA: Diagnosis not present

## 2016-04-30 DIAGNOSIS — E119 Type 2 diabetes mellitus without complications: Secondary | ICD-10-CM | POA: Diagnosis not present

## 2016-04-30 DIAGNOSIS — H2513 Age-related nuclear cataract, bilateral: Secondary | ICD-10-CM | POA: Diagnosis not present

## 2016-04-30 LAB — HM DIABETES EYE EXAM

## 2016-05-04 ENCOUNTER — Other Ambulatory Visit: Payer: Self-pay | Admitting: Internal Medicine

## 2016-05-04 ENCOUNTER — Telehealth: Payer: Self-pay

## 2016-05-04 NOTE — Telephone Encounter (Signed)
Which one?

## 2016-05-04 NOTE — Telephone Encounter (Signed)
Left message stating she is afraid one of meds is causing her hands to tingle and swell during night.

## 2016-05-07 NOTE — Telephone Encounter (Signed)
LMTCB

## 2016-05-10 NOTE — Telephone Encounter (Signed)
Left several messages awaiting call back

## 2016-05-11 ENCOUNTER — Other Ambulatory Visit: Payer: Self-pay | Admitting: Internal Medicine

## 2016-05-13 ENCOUNTER — Other Ambulatory Visit: Payer: Self-pay | Admitting: Internal Medicine

## 2016-05-13 MED ORDER — EMPAGLIFLOZIN 25 MG PO TABS: 25.0000 mg | ORAL_TABLET | Freq: Every day | ORAL | 3 refills | Status: DC

## 2016-05-15 ENCOUNTER — Other Ambulatory Visit: Payer: Self-pay | Admitting: Internal Medicine

## 2016-06-04 ENCOUNTER — Ambulatory Visit (INDEPENDENT_AMBULATORY_CARE_PROVIDER_SITE_OTHER): Payer: Medicare PPO

## 2016-06-04 DIAGNOSIS — D510 Vitamin B12 deficiency anemia due to intrinsic factor deficiency: Secondary | ICD-10-CM

## 2016-06-04 DIAGNOSIS — Z23 Encounter for immunization: Secondary | ICD-10-CM

## 2016-06-04 DIAGNOSIS — E559 Vitamin D deficiency, unspecified: Secondary | ICD-10-CM

## 2016-06-04 MED ORDER — CYANOCOBALAMIN 1000 MCG/ML IJ SOLN: 1000.0000 ug | Freq: Once | INTRAMUSCULAR | Status: DC

## 2016-06-11 ENCOUNTER — Other Ambulatory Visit: Payer: Self-pay

## 2016-06-11 NOTE — Telephone Encounter (Signed)
Advised OV for abdominal pains and poss UTI

## 2016-06-14 ENCOUNTER — Ambulatory Visit (INDEPENDENT_AMBULATORY_CARE_PROVIDER_SITE_OTHER): Payer: Medicare PPO | Admitting: Internal Medicine

## 2016-06-14 ENCOUNTER — Encounter: Payer: Self-pay | Admitting: Internal Medicine

## 2016-06-14 VITALS — BP 112/80 | HR 72 | Ht 61.5 in | Wt 171.0 lb

## 2016-06-14 DIAGNOSIS — I10 Essential (primary) hypertension: Secondary | ICD-10-CM

## 2016-06-14 DIAGNOSIS — D510 Vitamin B12 deficiency anemia due to intrinsic factor deficiency: Secondary | ICD-10-CM

## 2016-06-14 DIAGNOSIS — R1011 Right upper quadrant pain: Secondary | ICD-10-CM

## 2016-06-14 DIAGNOSIS — E782 Mixed hyperlipidemia: Secondary | ICD-10-CM | POA: Diagnosis not present

## 2016-06-14 DIAGNOSIS — G5603 Carpal tunnel syndrome, bilateral upper limbs: Secondary | ICD-10-CM

## 2016-06-14 DIAGNOSIS — E1100 Type 2 diabetes mellitus with hyperosmolarity without nonketotic hyperglycemic-hyperosmolar coma (NKHHC): Secondary | ICD-10-CM | POA: Diagnosis not present

## 2016-06-14 NOTE — Patient Instructions (Signed)
Call [facility] - Dr. Rob Hickman about carpal tunnel

## 2016-06-14 NOTE — Progress Notes (Signed)
Date:  06/14/2016   Name:  Sheila Sandoval   DOB:  06/30/1946   MRN:  [id number]   Chief Complaint: Follow-up (had abdominal pain x 3-4 days, has since stopped. ) Abdominal Pain  This is a new problem. The current episode started in the past 7 days. The problem has been resolved. The pain is mild. The quality of the pain is aching. Associated symptoms include belching and a fever. Pertinent negatives include no arthralgias, constipation, diarrhea, dysuria, headaches, hematuria, nausea or vomiting. She has tried nothing for the symptoms.  Diabetes  She presents for her follow-up diabetic visit. She has type 2 diabetes mellitus. Pertinent negatives for hypoglycemia include no headaches or tremors. Pertinent negatives for diabetes include no chest pain, no fatigue, no foot paresthesias, no polydipsia and no polyuria. Symptoms are stable. Pertinent negatives for diabetic complications include no peripheral neuropathy or retinopathy.  Hypertension  This is a chronic problem. The current episode started more than 1 year ago. The problem is unchanged. Pertinent negatives include no chest pain, headaches, palpitations or shortness of breath. There are no compliance problems.  There is no history of retinopathy.  CTS - has been having more symptoms - going numb every night.  Splints did not help.  She has trouble sleeping due to numbness.  Shaking the hands sometimes helps but not always. PA - had B12 10 days ago so it is too early to get a second one.  Lab Results  Component Value Date   HGBA1C 8.2 (H) 03/05/2016     Review of Systems  Constitutional: Positive for fever. Negative for appetite change, fatigue and unexpected weight change.  HENT: Negative for tinnitus and trouble swallowing.   Eyes: Negative for visual disturbance.  Respiratory: Negative for cough, chest tightness and shortness of breath.   Cardiovascular: Negative for chest pain, palpitations and leg swelling.    Gastrointestinal: Positive for abdominal distention and abdominal pain. Negative for constipation, diarrhea, nausea and vomiting.  Endocrine: Negative for polydipsia and polyuria.  Genitourinary: Negative for dysuria and hematuria.  Musculoskeletal: Negative for arthralgias.  Skin: Negative for color change and rash.  Neurological: Positive for numbness. Negative for tremors and headaches.  Psychiatric/Behavioral: Negative for dysphoric mood.    Patient Active Problem List   Diagnosis Date Noted  . Uncontrolled type 2 diabetes mellitus with hyperosmolarity without coma, without long-term current use of insulin ([place]) 10/23/2015  . Encephalocele ([place]) 08/01/2015  . Pernicious anemia 04/11/2015  . Carpal tunnel syndrome 01/14/2015  . HLD (hyperlipidemia) 01/14/2015  . Essential (primary) hypertension 01/14/2015  . Climacteric 01/14/2015  . Muscle fatigue 01/14/2015  . Arthritis of hand, degenerative 01/14/2015  . Avitaminosis D 01/14/2015    Prior to Admission medications   Medication Sig Start Date End Date Taking? Authorizing Provider  amLODipine (NORVASC) 10 MG tablet Take 1 tablet (10 mg total) by mouth daily. 12/09/15  Yes Glean Hess, MD  aspirin 81 MG tablet Take 1 tablet by mouth daily.   Yes Historical Provider, MD  Blood Glucose Calibration (ACCU-CHEK AVIVA) SOLN 1 each by In Vitro route daily as needed. 05/09/15  Yes Glean Hess, MD  Blood Glucose Monitoring Suppl (ACCU-CHEK AVIVA PLUS) W/DEVICE KIT 1 each by Does not apply route 2 (two) times daily. 05/09/15  Yes Glean Hess, MD  diclofenac sodium (VOLTAREN) 1 % GEL Apply 2 g topically 4 (four) times daily. 08/06/15  Yes Glean Hess, MD  empagliflozin (JARDIANCE) 25 MG TABS tablet Take  25 mg by mouth daily. 05/13/16  Yes Glean Hess, MD  estradiol (ESTRACE) 0.5 MG tablet TAKE 1 TABLET THREE TIMES A WEEK. 05/11/16  Yes Glean Hess, MD  ezetimibe (ZETIA) 10 MG tablet Take 1 tablet (10 mg total) by  mouth daily. 09/01/15  Yes Glean Hess, MD  Ferrous Gluconate 325 (36 FE) MG TABS Take by mouth.   Yes Historical Provider, MD  fluticasone (FLONASE) 50 MCG/ACT nasal spray SPRAY 1 SPRAY INTO BOTH NOSTRILS DAILY 05/15/16  Yes Glean Hess, MD  glucose blood (ACCU-CHEK AVIVA PLUS) test strip 1 each by Other route 2 (two) times daily. 05/09/15  Yes Glean Hess, MD  Lancet Devices [facility]) lancets 1 each by Other route 2 (two) times daily. 05/09/15  Yes Glean Hess, MD  meloxicam (MOBIC) 15 MG tablet TAKE 1 TABLET BY MOUTH ONCE DAILY 05/04/16  Yes Glean Hess, MD  ramipril (ALTACE) 5 MG capsule Take 1 capsule (5 mg total) by mouth daily. 10/24/15  Yes Glean Hess, MD  rosuvastatin (CRESTOR) 5 MG tablet Take 1 tablet (5 mg total) by mouth daily. 03/05/16  Yes Glean Hess, MD  sitaGLIPtin-metformin (JANUMET) 50-1000 MG tablet Take 1 tablet by mouth 2 (two) times daily with a meal. 03/31/16  Yes Glean Hess, MD  triamterene-hydrochlorothiazide (MAXZIDE-25) 37.5-25 MG tablet TAKE 1 TABLET BY MOUTH ONCE DAILY 02/18/16  Yes Glean Hess, MD  ibuprofen (ADVIL,MOTRIN) 800 MG tablet Take 1 tablet by mouth 3 (three) times daily. 08/14/13   Historical Provider, MD    Allergies  Allergen Reactions  . Atorvastatin     chest tightness  . Tramadol Hcl Nausea And Vomiting    Past Surgical History:  Procedure Laterality Date  . ABDOMINAL HYSTERECTOMY  1981  . APPENDECTOMY  1981  . REPAIR DURAL / CSF LEAK  2002    Social History  Substance Use Topics  . Smoking status: Never Smoker  . Smokeless tobacco: Not on file  . Alcohol use No     Medication list has been reviewed and updated.   Physical Exam  Constitutional: She is oriented to person, place, and time. She appears well-developed. No distress.  HENT:  Head: Normocephalic and atraumatic.  Cardiovascular: Normal rate, regular rhythm and normal heart sounds.   Pulmonary/Chest: Effort normal and  breath sounds normal. No respiratory distress. She has no wheezes.  Abdominal: Soft. Bowel sounds are normal. She exhibits no distension. There is tenderness in the right upper quadrant. There is no rigidity, no rebound, no guarding, no tenderness at McBurney's point and negative Murphy's sign.  Musculoskeletal: Normal range of motion.  Neurological: She is alert and oriented to person, place, and time.  Skin: Skin is warm and dry. No rash noted.  Psychiatric: She has a normal mood and affect. Her behavior is normal. Thought content normal.  Nursing note and vitals reviewed.   BP 112/80   Pulse 72   Ht 5' 1.5" (1.562 m)   Wt 171 lb (77.6 kg)   BMI 31.79 kg/m   Assessment and Plan: 1. Right upper quadrant abdominal pain Now resolved - could be gall bladder so recommend cutting back on fatty foods  2. Essential (primary) hypertension controlled - Basic metabolic panel  3. Uncontrolled type 2 diabetes mellitus with hyperosmolarity without coma, without long-term current use of insulin (HCC) Continue quadruple agents - Hemoglobin A1c  4. Bilateral carpal tunnel syndrome Now persistent - Ambulatory referral to Orthopedic Surgery  5. Mixed hyperlipidemia On statin therapy  6. Pernicious anemia Continue monthly B12  - CBC with Differential/Platelet   Halina Maidens, MD [facility]  06/14/2016

## 2016-06-15 LAB — CBC WITH DIFFERENTIAL/PLATELET
BASOS ABS: 0 10*3/uL (ref 0.0–0.2)
Basos: 1 %
EOS (ABSOLUTE): 0.2 10*3/uL (ref 0.0–0.4)
Eos: 3 %
Hematocrit: 39.9 % (ref 34.0–46.6)
Hemoglobin: 12.5 g/dL (ref 11.1–15.9)
Immature Grans (Abs): 0 10*3/uL (ref 0.0–0.1)
Immature Granulocytes: 0 %
LYMPHS ABS: 2.6 10*3/uL (ref 0.7–3.1)
Lymphs: 41 %
MCH: 22.8 pg — AB (ref 26.6–33.0)
MCHC: 31.3 g/dL — AB (ref 31.5–35.7)
MCV: 73 fL — ABNORMAL LOW (ref 79–97)
MONOS ABS: 0.4 10*3/uL (ref 0.1–0.9)
Monocytes: 7 %
NEUTROS ABS: 3.1 10*3/uL (ref 1.4–7.0)
Neutrophils: 48 %
PLATELETS: 279 10*3/uL (ref 150–379)
RBC: 5.48 x10E6/uL — ABNORMAL HIGH (ref 3.77–5.28)
RDW: 16.2 % — AB (ref 12.3–15.4)
WBC: 6.4 10*3/uL (ref 3.4–10.8)

## 2016-06-15 LAB — BASIC METABOLIC PANEL
BUN/Creatinine Ratio: 15 (ref 12–28)
BUN: 16 mg/dL (ref 8–27)
CALCIUM: 10.6 mg/dL — AB (ref 8.7–10.3)
CO2: 25 mmol/L (ref 18–29)
Chloride: 99 mmol/L (ref 96–106)
Creatinine, Ser: 1.07 mg/dL — ABNORMAL HIGH (ref 0.57–1.00)
GFR calc Af Amer: 61 mL/min/{1.73_m2} (ref >59)
GFR calc non Af Amer: 53 mL/min/{1.73_m2} — ABNORMAL LOW (ref >59)
GLUCOSE: 86 mg/dL (ref 65–99)
POTASSIUM: 4 mmol/L (ref 3.5–5.2)
SODIUM: 140 mmol/L (ref 134–144)

## 2016-06-15 LAB — HEMOGLOBIN A1C
ESTIMATED AVERAGE GLUCOSE: 163 mg/dL
HEMOGLOBIN A1C: 7.3 % — AB (ref 4.8–5.6)

## 2016-06-25 ENCOUNTER — Ambulatory Visit: Payer: Medicare PPO | Admitting: Internal Medicine

## 2016-08-04 ENCOUNTER — Other Ambulatory Visit: Payer: Self-pay | Admitting: Internal Medicine

## 2016-08-09 ENCOUNTER — Ambulatory Visit (INDEPENDENT_AMBULATORY_CARE_PROVIDER_SITE_OTHER): Payer: Medicare PPO

## 2016-08-09 VITALS — Resp 16 | Ht 61.5 in | Wt 171.0 lb

## 2016-08-09 DIAGNOSIS — Z23 Encounter for immunization: Secondary | ICD-10-CM

## 2016-08-09 DIAGNOSIS — D510 Vitamin B12 deficiency anemia due to intrinsic factor deficiency: Secondary | ICD-10-CM | POA: Diagnosis not present

## 2016-08-12 ENCOUNTER — Other Ambulatory Visit: Payer: Self-pay | Admitting: Internal Medicine

## 2016-08-13 ENCOUNTER — Ambulatory Visit: Payer: Medicare PPO | Admitting: Internal Medicine

## 2016-08-27 ENCOUNTER — Ambulatory Visit: Payer: Medicare PPO | Admitting: Internal Medicine

## 2016-09-14 ENCOUNTER — Other Ambulatory Visit: Payer: Self-pay | Admitting: Internal Medicine

## 2016-09-14 DIAGNOSIS — E1100 Type 2 diabetes mellitus with hyperosmolarity without nonketotic hyperglycemic-hyperosmolar coma (NKHHC): Secondary | ICD-10-CM

## 2016-09-15 ENCOUNTER — Other Ambulatory Visit: Payer: Self-pay | Admitting: Internal Medicine

## 2016-09-15 MED ORDER — GLIMEPIRIDE 2 MG PO TABS: 2.0000 mg | ORAL_TABLET | Freq: Every day | ORAL | 1 refills | Status: DC

## 2016-09-17 ENCOUNTER — Encounter: Payer: Self-pay | Admitting: Internal Medicine

## 2016-09-17 ENCOUNTER — Ambulatory Visit (INDEPENDENT_AMBULATORY_CARE_PROVIDER_SITE_OTHER): Payer: Medicare PPO | Admitting: Internal Medicine

## 2016-09-17 VITALS — BP 124/72 | HR 88 | Ht 61.5 in | Wt 168.0 lb

## 2016-09-17 DIAGNOSIS — D510 Vitamin B12 deficiency anemia due to intrinsic factor deficiency: Secondary | ICD-10-CM | POA: Diagnosis not present

## 2016-09-17 DIAGNOSIS — Z23 Encounter for immunization: Secondary | ICD-10-CM | POA: Diagnosis not present

## 2016-09-17 DIAGNOSIS — I10 Essential (primary) hypertension: Secondary | ICD-10-CM | POA: Diagnosis not present

## 2016-09-17 DIAGNOSIS — E1100 Type 2 diabetes mellitus with hyperosmolarity without nonketotic hyperglycemic-hyperosmolar coma (NKHHC): Secondary | ICD-10-CM | POA: Diagnosis not present

## 2016-09-17 DIAGNOSIS — G5603 Carpal tunnel syndrome, bilateral upper limbs: Secondary | ICD-10-CM | POA: Diagnosis not present

## 2016-09-17 DIAGNOSIS — Q019 Encephalocele, unspecified: Secondary | ICD-10-CM | POA: Diagnosis not present

## 2016-09-17 DIAGNOSIS — E782 Mixed hyperlipidemia: Secondary | ICD-10-CM | POA: Diagnosis not present

## 2016-09-17 MED ORDER — CYANOCOBALAMIN 1000 MCG/ML IJ SOLN
1000.0000 ug | Freq: Once | INTRAMUSCULAR | Status: AC
  Administered 2016-09-17: 1000 ug via INTRAMUSCULAR

## 2016-09-17 NOTE — Progress Notes (Signed)
Date:  09/17/2016   Name:  Sheila Sandoval   DOB:  08-05-1945   MRN:  [id number]   Chief Complaint: Diabetes (BS - 98 before eating. (yesterday)) and Hypertension Diabetes  She presents for her follow-up diabetic visit. She has type 2 diabetes mellitus. Her disease course has been stable. Pertinent negatives for hypoglycemia include no headaches or tremors. Pertinent negatives for diabetes include no chest pain, no fatigue, no polydipsia, no polyuria and no weakness. Symptoms are stable. Her weight is stable.  Hypertension  This is a chronic problem. The current episode started more than 1 year ago. The problem is unchanged. The problem is controlled. Pertinent negatives include no chest pain, headaches, palpitations or shortness of breath.  CTS - having persistent sx in both hands and arms.  Seen by Ortho - NCS planned.  She is to start taking B6. Pernicious anemia - due for monthly B12 injection. Repair of CSF leak - done in 2002.  No further issues or follow up needed.  She has some sinus congestion at times but this is managed with Flonase NS. Lab Results  Component Value Date   HGBA1C 7.3 (H) 06/14/2016     Review of Systems  Constitutional: Negative for appetite change, fatigue, fever and unexpected weight change.  HENT: Negative for tinnitus and trouble swallowing.   Eyes: Negative for visual disturbance.  Respiratory: Negative for cough, chest tightness and shortness of breath.   Cardiovascular: Negative for chest pain, palpitations and leg swelling.  Gastrointestinal: Negative for abdominal pain.  Endocrine: Negative for polydipsia and polyuria.  Genitourinary: Negative for difficulty urinating, dysuria and hematuria.  Musculoskeletal: Negative for arthralgias.  Skin: Negative for color change and rash.  Neurological: Positive for numbness. Negative for tremors, weakness and headaches.  Psychiatric/Behavioral: Negative for dysphoric mood.    Patient Active Problem  List   Diagnosis Date Noted  . Uncontrolled type 2 diabetes mellitus with hyperosmolarity without coma, without long-term current use of insulin ([place]) 10/23/2015  . Encephalocele ([place]) 08/01/2015  . Pernicious anemia 04/11/2015  . Carpal tunnel syndrome 01/14/2015  . HLD (hyperlipidemia) 01/14/2015  . Hypertension 01/14/2015  . Climacteric 01/14/2015  . Muscle fatigue 01/14/2015  . Arthritis of hand, degenerative 01/14/2015  . Avitaminosis D 01/14/2015  . History of foreign body in auditory canal 02/25/2012  . Impacted cerumen of left ear 02/25/2012  . GERD (gastroesophageal reflux disease) 10/29/2011  . Spondylolisthesis 10/29/2011    Prior to Admission medications   Medication Sig Start Date End Date Taking? Authorizing Provider  amLODipine (NORVASC) 10 MG tablet Take 1 tablet (10 mg total) by mouth daily. 12/09/15   Glean Hess, MD  aspirin 81 MG tablet Take 1 tablet by mouth daily.    Historical Provider, MD  Blood Glucose Calibration (ACCU-CHEK AVIVA) SOLN 1 each by In Vitro route daily as needed. 05/09/15   Glean Hess, MD  Blood Glucose Monitoring Suppl (ACCU-CHEK AVIVA PLUS) W/DEVICE KIT 1 each by Does not apply route 2 (two) times daily. 05/09/15   Glean Hess, MD  diclofenac sodium (VOLTAREN) 1 % GEL Apply 2 g topically 4 (four) times daily. 08/06/15   Glean Hess, MD  empagliflozin (JARDIANCE) 25 MG TABS tablet Take 25 mg by mouth daily. 05/13/16   Glean Hess, MD  estradiol (ESTRACE) 0.5 MG tablet TAKE 1 TABLET THREE TIMES A WEEK. 08/04/16   Glean Hess, MD  ezetimibe (ZETIA) 10 MG tablet Take 1 tablet (10 mg total) by  mouth daily. 09/01/15   Glean Hess, MD  Ferrous Gluconate 325 (36 FE) MG TABS Take by mouth.    Historical Provider, MD  fluticasone (FLONASE) 50 MCG/ACT nasal spray SPRAY 1 SPRAY INTO BOTH NOSTRILS DAILY 05/15/16   Glean Hess, MD  glimepiride (AMARYL) 2 MG tablet Take 1 tablet (2 mg total) by mouth daily with breakfast.  09/15/16   Glean Hess, MD  glucose blood (ACCU-CHEK AVIVA PLUS) test strip 1 each by Other route 2 (two) times daily. 05/09/15   Glean Hess, MD  ibuprofen (ADVIL,MOTRIN) 800 MG tablet Take 1 tablet by mouth 3 (three) times daily. 08/14/13   Historical Provider, MD  Lancet Devices [facility]) lancets 1 each by Other route 2 (two) times daily. 05/09/15   Glean Hess, MD  meloxicam (MOBIC) 15 MG tablet TAKE 1 TABLET BY MOUTH ONCE DAILY 08/12/16   Glean Hess, MD  ramipril (ALTACE) 5 MG capsule Take 1 capsule (5 mg total) by mouth daily. 10/24/15   Glean Hess, MD  rosuvastatin (CRESTOR) 5 MG tablet Take 1 tablet (5 mg total) by mouth daily. 03/05/16   Glean Hess, MD  sitaGLIPtin-metformin (JANUMET) 50-1000 MG tablet Take 1 tablet by mouth 2 (two) times daily with a meal. 03/31/16   Glean Hess, MD  triamterene-hydrochlorothiazide (MAXZIDE-25) 37.5-25 MG tablet TAKE 1 TABLET BY MOUTH ONCE DAILY 02/18/16   Glean Hess, MD    Allergies  Allergen Reactions  . Atorvastatin     chest tightness  . Tramadol Hcl Nausea And Vomiting    Past Surgical History:  Procedure Laterality Date  . ABDOMINAL HYSTERECTOMY  1981  . APPENDECTOMY  1981  . REPAIR DURAL / CSF LEAK  2002    Social History  Substance Use Topics  . Smoking status: Never Smoker  . Smokeless tobacco: Never Used  . Alcohol use No     Medication list has been reviewed and updated.   Physical Exam  Constitutional: She is oriented to person, place, and time. She appears well-developed. No distress.  HENT:  Head: Normocephalic and atraumatic.  Neck: Normal range of motion. Carotid bruit is not present.  Cardiovascular: Normal rate, regular rhythm and normal heart sounds.   Pulmonary/Chest: Effort normal and breath sounds normal. No respiratory distress. She has no wheezes. She has no rales.  Musculoskeletal: She exhibits no edema or deformity.  Neurological: She is alert and oriented  to person, place, and time. She has normal strength.  Skin: Skin is warm and dry. No rash noted.  Psychiatric: She has a normal mood and affect. Her behavior is normal. Thought content normal.  Nursing note and vitals reviewed.   BP 124/72   Pulse 88   Ht 5' 1.5" (1.562 m)   Wt 168 lb (76.2 kg)   SpO2 98%   BMI 31.23 kg/m   Assessment and Plan: 1. Uncontrolled type 2 diabetes mellitus with hyperosmolarity without coma, without long-term current use of insulin (HCC) Continue current medications - Hemoglobin B2E - Basic metabolic panel  2. Essential hypertension controlled  3. Encephalocele ([place]) No follow up needed  4. Mixed hyperlipidemia Continue medication  5. Bilateral carpal tunnel syndrome Begin B6; follow up for testing as planned - TSH  6. Need for pneumococcal vaccination - Pneumococcal polysaccharide vaccine 23-valent greater than or equal to 2yo subcutaneous/IM  7. Pernicious anemia - cyanocobalamin ((VITAMIN B-12)) injection 1,000 mcg; Inject 1 mL (1,000 mcg total) into the muscle once.  Meds ordered this encounter  Medications  . cyanocobalamin ((VITAMIN B-12)) injection 1,000 mcg    Halina Maidens, MD [facility]  09/17/2016

## 2016-09-18 LAB — HEMOGLOBIN A1C
ESTIMATED AVERAGE GLUCOSE: 163 mg/dL
Hgb A1c MFr Bld: 7.3 % — ABNORMAL HIGH (ref 4.8–5.6)

## 2016-09-18 LAB — BASIC METABOLIC PANEL
BUN / CREAT RATIO: 19 (ref 12–28)
BUN: 21 mg/dL (ref 8–27)
CALCIUM: 10.9 mg/dL — AB (ref 8.7–10.3)
CHLORIDE: 97 mmol/L (ref 96–106)
CO2: 24 mmol/L (ref 18–29)
Creatinine, Ser: 1.12 mg/dL — ABNORMAL HIGH (ref 0.57–1.00)
GFR calc non Af Amer: 50 mL/min/{1.73_m2} — ABNORMAL LOW (ref >59)
GFR, EST AFRICAN AMERICAN: 58 mL/min/{1.73_m2} — AB (ref >59)
Glucose: 69 mg/dL (ref 65–99)
Potassium: 4.5 mmol/L (ref 3.5–5.2)
Sodium: 140 mmol/L (ref 134–144)

## 2016-09-18 LAB — TSH: TSH: 1.3 u[IU]/mL (ref 0.450–4.500)

## 2016-09-20 ENCOUNTER — Other Ambulatory Visit: Payer: Self-pay

## 2016-10-02 ENCOUNTER — Encounter: Payer: Self-pay | Admitting: Internal Medicine

## 2016-10-02 LAB — PTH, INTACT AND CALCIUM
CALCIUM: 10.3 mg/dL (ref 8.7–10.3)
PTH: 27 pg/mL (ref 15–65)

## 2016-10-08 ENCOUNTER — Other Ambulatory Visit: Payer: Self-pay | Admitting: Internal Medicine

## 2016-10-19 ENCOUNTER — Other Ambulatory Visit: Payer: Self-pay | Admitting: Internal Medicine

## 2016-10-19 DIAGNOSIS — E782 Mixed hyperlipidemia: Secondary | ICD-10-CM

## 2016-10-19 MED ORDER — AMLODIPINE BESYLATE 10 MG PO TABS: 10.0000 mg | ORAL_TABLET | Freq: Every day | ORAL | 3 refills | Status: DC

## 2016-10-19 MED ORDER — EMPAGLIFLOZIN 25 MG PO TABS: 25.0000 mg | ORAL_TABLET | Freq: Every day | ORAL | 3 refills | Status: DC

## 2016-10-19 MED ORDER — EZETIMIBE 10 MG PO TABS: 10.0000 mg | ORAL_TABLET | Freq: Every day | ORAL | 3 refills | Status: DC

## 2016-10-19 MED ORDER — SITAGLIPTIN PHOS-METFORMIN HCL 50-1000 MG PO TABS: 1.0000 | ORAL_TABLET | Freq: Two times a day (BID) | ORAL | 3 refills | Status: DC

## 2016-10-19 MED ORDER — GLIMEPIRIDE 2 MG PO TABS: 2.0000 mg | ORAL_TABLET | Freq: Every day | ORAL | 3 refills | Status: DC

## 2016-10-19 MED ORDER — ROSUVASTATIN CALCIUM 5 MG PO TABS: 5.0000 mg | ORAL_TABLET | Freq: Every day | ORAL | 3 refills | Status: DC

## 2016-10-19 MED ORDER — ESTRADIOL 0.5 MG PO TABS: 0.5000 mg | ORAL_TABLET | ORAL | 3 refills | Status: DC

## 2016-10-19 MED ORDER — RAMIPRIL 5 MG PO CAPS: 5.0000 mg | ORAL_CAPSULE | Freq: Every day | ORAL | 3 refills | Status: DC

## 2016-10-19 MED ORDER — TRIAMTERENE-HCTZ 37.5-25 MG PO TABS: 1.0000 | ORAL_TABLET | Freq: Every day | ORAL | 3 refills | Status: DC

## 2016-10-20 ENCOUNTER — Other Ambulatory Visit: Payer: Self-pay | Admitting: Internal Medicine

## 2016-10-20 MED ORDER — FLUTICASONE PROPIONATE 50 MCG/ACT NA SUSP: 2.0000 | Freq: Every day | NASAL | 3 refills | Status: DC

## 2016-10-20 MED ORDER — GLUCOSE BLOOD VI STRP: 1.0000 | ORAL_STRIP | Freq: Two times a day (BID) | 3 refills | Status: DC

## 2016-10-20 MED ORDER — ACCU-CHEK AVIVA VI SOLN: 1.0000 | Freq: Every day | 0 refills | Status: DC | PRN

## 2016-10-20 MED ORDER — MELOXICAM 15 MG PO TABS: 15.0000 mg | ORAL_TABLET | Freq: Every day | ORAL | 3 refills | Status: DC

## 2016-10-20 MED ORDER — ACCU-CHEK AVIVA PLUS W/DEVICE KIT: 1.0000 | PACK | Freq: Two times a day (BID) | 0 refills | Status: DC

## 2016-10-20 MED ORDER — ACCU-CHEK SOFTCLIX LANCET DEV MISC: 1.0000 | Freq: Two times a day (BID) | 3 refills | Status: DC

## 2016-10-21 ENCOUNTER — Ambulatory Visit: Payer: Medicare PPO | Admitting: Internal Medicine

## 2016-10-28 DIAGNOSIS — L300 Nummular dermatitis: Secondary | ICD-10-CM | POA: Diagnosis not present

## 2016-11-02 ENCOUNTER — Other Ambulatory Visit: Payer: Self-pay | Admitting: Internal Medicine

## 2016-11-02 ENCOUNTER — Telehealth: Payer: Self-pay

## 2016-11-02 MED ORDER — ESTRADIOL 0.5 MG PO TABS: 0.5000 mg | ORAL_TABLET | ORAL | 3 refills | Status: DC

## 2016-11-02 NOTE — Telephone Encounter (Signed)
Done

## 2016-11-02 NOTE — Telephone Encounter (Signed)
Pt called stating that she wishes to continue taking estrace. Called previous pharmacy [facility]" and said that they will continue to fill it without PA needed. So would like that to continue to be sent to [facility] but wants the rest of medications sent to [facility].

## 2016-11-05 ENCOUNTER — Ambulatory Visit (INDEPENDENT_AMBULATORY_CARE_PROVIDER_SITE_OTHER): Payer: Medicare PPO | Admitting: Internal Medicine

## 2016-11-05 ENCOUNTER — Other Ambulatory Visit: Payer: Self-pay | Admitting: Internal Medicine

## 2016-11-05 ENCOUNTER — Encounter: Payer: Self-pay | Admitting: Internal Medicine

## 2016-11-05 VITALS — BP 112/72 | HR 67 | Ht 61.5 in | Wt 171.8 lb

## 2016-11-05 DIAGNOSIS — E2839 Other primary ovarian failure: Secondary | ICD-10-CM | POA: Diagnosis not present

## 2016-11-05 DIAGNOSIS — I10 Essential (primary) hypertension: Secondary | ICD-10-CM

## 2016-11-05 DIAGNOSIS — E782 Mixed hyperlipidemia: Secondary | ICD-10-CM | POA: Diagnosis not present

## 2016-11-05 DIAGNOSIS — Z0000 Encounter for general adult medical examination without abnormal findings: Secondary | ICD-10-CM | POA: Diagnosis not present

## 2016-11-05 DIAGNOSIS — Z1159 Encounter for screening for other viral diseases: Secondary | ICD-10-CM | POA: Diagnosis not present

## 2016-11-05 DIAGNOSIS — D510 Vitamin B12 deficiency anemia due to intrinsic factor deficiency: Secondary | ICD-10-CM

## 2016-11-05 DIAGNOSIS — E1100 Type 2 diabetes mellitus with hyperosmolarity without nonketotic hyperglycemic-hyperosmolar coma (NKHHC): Secondary | ICD-10-CM

## 2016-11-05 DIAGNOSIS — Z1231 Encounter for screening mammogram for malignant neoplasm of breast: Secondary | ICD-10-CM

## 2016-11-05 DIAGNOSIS — K219 Gastro-esophageal reflux disease without esophagitis: Secondary | ICD-10-CM

## 2016-11-05 DIAGNOSIS — E559 Vitamin D deficiency, unspecified: Secondary | ICD-10-CM | POA: Diagnosis not present

## 2016-11-05 LAB — POCT URINALYSIS DIPSTICK
BILIRUBIN UA: NEGATIVE
Ketones, UA: NEGATIVE
Leukocytes, UA: NEGATIVE
Nitrite, UA: NEGATIVE
Protein, UA: NEGATIVE
RBC UA: NEGATIVE
UROBILINOGEN UA: 0.2 U/dL
pH, UA: 7 (ref 5.0–8.0)

## 2016-11-05 MED ORDER — CYANOCOBALAMIN 1000 MCG/ML IJ SOLN
1000.0000 ug | Freq: Once | INTRAMUSCULAR | Status: AC
  Administered 2016-11-05: 1000 ug via INTRAMUSCULAR

## 2016-11-05 NOTE — Patient Instructions (Addendum)
Health Maintenance  Topic Date Due  . Hepatitis C Screening  1946/01/29  . DEXA SCAN  02/28/2011  . INFLUENZA VACCINE  02/23/2017  . MAMMOGRAM  03/05/2017  . HEMOGLOBIN A1C  03/17/2017  . OPHTHALMOLOGY EXAM  04/30/2017  . FOOT EXAM  06/14/2017  . TETANUS/TDAP  09/04/2020  . COLONOSCOPY  09/08/2025  . PNA vac Low Risk Adult  Completed   Consult your pharmacist about the new Shingles vaccine called Shingrix.

## 2016-11-05 NOTE — Progress Notes (Signed)
Patient: Sheila Sandoval, Female    DOB: [birth year], 71 y.o.   MRN: [id number] Visit Date: 11/05/2016  Today's Provider: Halina Maidens, MD   Chief Complaint  Patient presents with  . Medicare Wellness    Pt is requesting to get B12 shot today.  . Rash    Left leg spots would like to see what doctor thinks. Was told it was excema but the cream dermatology gave has not helped them. Patient has beent taking medication for a week. She has one more week to go,   Subjective:    Annual wellness visit Sheila Sandoval is a 71 y.o. female who presents today for her Subsequent Annual Wellness Visit. She feels fairly well. She reports exercising walking. She reports she is sleeping fairly well. She is due for mammogram in September.  She has not had a DEXA in years.  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  ----------------------------------------------------------- Rash  This is a new problem. The problem has been gradually improving since onset. The affected locations include the left lower leg. The rash is characterized by itchiness and dryness. Pertinent negatives include no congestion, cough, diarrhea, fatigue, fever, shortness of breath or vomiting. Past treatments include topical steroids (seen by Derm - told it was eczema).  Hypertension  This is a chronic problem. The problem is unchanged. The problem is controlled. Pertinent negatives include no chest pain, headaches, palpitations or shortness of breath.  Diabetes  She presents for her follow-up diabetic visit. She has type 2 diabetes mellitus. Pertinent negatives for hypoglycemia include no dizziness, headaches, nervousness/anxiousness or tremors. Pertinent negatives for diabetes include no chest pain, no fatigue, no polydipsia and no polyuria. Symptoms are stable. She monitors blood glucose at home 1-2 x per day. Her breakfast blood glucose is taken between 7-8 am. Her breakfast blood glucose range  is generally 110-130 mg/dl. An ACE inhibitor/angiotensin II receptor blocker is being taken.  Hyperlipidemia  This is a chronic problem. Pertinent negatives include no chest pain or shortness of breath. Current antihyperlipidemic treatment includes statins.  Gastroesophageal Reflux  She reports no abdominal pain, no chest pain, no coughing, no heartburn, no hoarse voice, no nausea or no wheezing. The problem occurs rarely. Pertinent negatives include no fatigue. She has tried a diet change for the symptoms.   Lab Results  Component Value Date   HGBA1C 7.3 (H) 09/17/2016   Lab Results  Component Value Date   CREATININE 1.12 (H) 09/17/2016   Lab Results  Component Value Date   CHOL 139 10/23/2015   HDL 60 10/23/2015   LDLCALC 65 10/23/2015   TRIG 71 10/23/2015   CHOLHDL 2.3 10/23/2015    Review of Systems  Constitutional: Negative for chills, fatigue and fever.  HENT: Negative for congestion, hearing loss, hoarse voice, tinnitus, trouble swallowing and voice change.   Eyes: Negative for visual disturbance.  Respiratory: Negative for cough, chest tightness, shortness of breath and wheezing.   Cardiovascular: Negative for chest pain, palpitations and leg swelling.  Gastrointestinal: Negative for abdominal pain, constipation, diarrhea, heartburn, nausea and vomiting.  Endocrine: Negative for polydipsia and polyuria.  Genitourinary: Negative for dysuria, frequency, genital sores, vaginal bleeding and vaginal discharge.  Musculoskeletal: Negative for arthralgias, gait problem and joint swelling.  Skin: Positive for rash. Negative for color change.  Neurological: Negative for dizziness, tremors, light-headedness and headaches.  Hematological: Negative for adenopathy. Does not bruise/bleed easily.  Psychiatric/Behavioral: Negative for dysphoric mood and sleep disturbance.  The patient is not nervous/anxious.     Social History   Social History  . Marital status: Single    Spouse  name: N/A  . Number of children: N/A  . Years of education: N/A   Occupational History  . Not on file.   Social History Main Topics  . Smoking status: Never Smoker  . Smokeless tobacco: Never Used  . Alcohol use No  . Drug use: No  . Sexual activity: Not on file   Other Topics Concern  . Not on file   Social History Narrative  . No narrative on file    Patient Active Problem List   Diagnosis Date Noted  . Hypercalcemia 10/02/2016  . Uncontrolled type 2 diabetes mellitus with hyperosmolarity without coma, without long-term current use of insulin ([place]) 10/23/2015  . Encephalocele ([place]) 08/01/2015  . Pernicious anemia 04/11/2015  . Carpal tunnel syndrome 01/14/2015  . HLD (hyperlipidemia) 01/14/2015  . Essential hypertension 01/14/2015  . Climacteric 01/14/2015  . Muscle fatigue 01/14/2015  . Arthritis of hand, degenerative 01/14/2015  . Avitaminosis D 01/14/2015  . GERD (gastroesophageal reflux disease) 10/29/2011  . Spondylolisthesis 10/29/2011    Past Surgical History:  Procedure Laterality Date  . ABDOMINAL HYSTERECTOMY  1981  . APPENDECTOMY  1981  . REPAIR DURAL / CSF LEAK  2002    Her family history includes Cancer in her father; Diabetes in her mother.     Previous Medications   AMLODIPINE (NORVASC) 10 MG TABLET    Take 1 tablet (10 mg total) by mouth daily.   AMMONIUM LACTATE (AMLACTIN) 12 % CREAM    Apply topically as needed for dry skin.   ASPIRIN 81 MG TABLET    Take 1 tablet by mouth daily.   BLOOD GLUCOSE CALIBRATION (ACCU-CHEK AVIVA) SOLN    1 each by In Vitro route daily as needed.   BLOOD GLUCOSE MONITORING SUPPL (ACCU-CHEK AVIVA PLUS) W/DEVICE KIT    1 each by Does not apply route 2 (two) times daily.   DICLOFENAC SODIUM (VOLTAREN) 1 % GEL    Apply 2 g topically 4 (four) times daily.   EMPAGLIFLOZIN (JARDIANCE) 25 MG TABS TABLET    Take 25 mg by mouth daily.   ESTRADIOL (ESTRACE) 0.5 MG TABLET    Take 1 tablet (0.5 mg total) by mouth 3 (three)  times a week.   EZETIMIBE (ZETIA) 10 MG TABLET    Take 1 tablet (10 mg total) by mouth daily.   FERROUS GLUCONATE 325 (36 FE) MG TABS    Take by mouth.   FLUTICASONE (FLONASE) 50 MCG/ACT NASAL SPRAY    Place 2 sprays into both nostrils daily.   GLIMEPIRIDE (AMARYL) 2 MG TABLET    Take 1 tablet (2 mg total) by mouth daily with breakfast.   GLUCOSE BLOOD (ACCU-CHEK AVIVA PLUS) TEST STRIP    1 each by Other route 2 (two) times daily.   IBUPROFEN (ADVIL,MOTRIN) 800 MG TABLET    Take 1 tablet by mouth 3 (three) times daily.   LANCET DEVICES (ACCU-CHEK SOFTCLIX) LANCETS    1 each by Other route 2 (two) times daily.   MELOXICAM (MOBIC) 15 MG TABLET    Take 1 tablet (15 mg total) by mouth daily.   RAMIPRIL (ALTACE) 5 MG CAPSULE    Take 1 capsule (5 mg total) by mouth daily.   ROSUVASTATIN (CRESTOR) 5 MG TABLET    Take 1 tablet (5 mg total) by mouth daily.   SITAGLIPTIN-METFORMIN (JANUMET) 50-1000 MG  TABLET    Take 1 tablet by mouth 2 (two) times daily with a meal.   TRIAMCINOLONE CREAM (KENALOG) 0.1 %    Apply 1 application topically 2 (two) times daily.   TRIAMTERENE-HYDROCHLOROTHIAZIDE (MAXZIDE-25) 37.5-25 MG TABLET    Take 1 tablet by mouth daily.    Patient Care Team: Glean Hess, MD as PCP - General (Family Medicine)      Objective:   Vitals: BP 112/72 (BP Location: Left Arm, Patient Position: Sitting, Cuff Size: Normal)   Pulse 67   Ht 5' 1.5" (1.562 m)   Wt 171 lb 12.8 oz (77.9 kg)   SpO2 99%   BMI 31.94 kg/m   Physical Exam  Constitutional: She is oriented to person, place, and time. She appears well-developed and well-nourished. No distress.  HENT:  Head: Normocephalic and atraumatic.  Right Ear: Tympanic membrane and ear canal normal.  Left Ear: Tympanic membrane and ear canal normal.  Nose: Right sinus exhibits no maxillary sinus tenderness. Left sinus exhibits no maxillary sinus tenderness.  Mouth/Throat: Uvula is midline and oropharynx is clear and moist.  Eyes:  Conjunctivae and EOM are normal. Right eye exhibits no discharge. Left eye exhibits no discharge. No scleral icterus.  Neck: Normal range of motion. Carotid bruit is not present. No erythema present. No thyromegaly present.  Cardiovascular: Normal rate, regular rhythm, normal heart sounds and normal pulses.   Pulmonary/Chest: Effort normal. No respiratory distress. She has no wheezes. Right breast exhibits no mass, no nipple discharge, no skin change and no tenderness. Left breast exhibits no mass, no nipple discharge, no skin change and no tenderness.  Abdominal: Soft. Bowel sounds are normal. There is no hepatosplenomegaly. There is no tenderness. There is no CVA tenderness.  Musculoskeletal: She exhibits no edema or tenderness.  Lymphadenopathy:    She has no cervical adenopathy.    She has no axillary adenopathy.  Neurological: She is alert and oriented to person, place, and time. She has normal reflexes. No cranial nerve deficit or sensory deficit.  Skin: Skin is warm, dry and intact.     Psychiatric: She has a normal mood and affect. Her speech is normal and behavior is normal. Thought content normal.  Nursing note and vitals reviewed.   Activities of Daily Living In your present state of health, do you have any difficulty performing the following activities: 11/05/2016 01/09/2016  Hearing? N N  Vision? N N  Difficulty concentrating or making decisions? N N  Walking or climbing stairs? N Y  Dressing or bathing? N N  Doing errands, shopping? N N  Preparing Food and eating ? N -  Using the Toilet? N -  In the past six months, have you accidently leaked urine? N -  Do you have problems with loss of bowel control? N -  Managing your Medications? N -  Managing your Finances? N -  Housekeeping or managing your Housekeeping? N -  Some recent data might be hidden    Fall Risk Assessment Fall Risk  11/05/2016 10/23/2015 04/11/2015  Falls in the past year? No No No      Depression  Screen PHQ 2/9 Scores 11/05/2016 10/23/2015 04/11/2015  PHQ - 2 Score 0 0 0   6CIT Screen 11/05/2016  What Year? 0 points  What month? 0 points  What time? 0 points  Count back from 20 0 points  Months in reverse 0 points  Repeat phrase 0 points  Total Score 0     Medicare Annual  Wellness Visit Summary:  Reviewed patient's Family Medical History Reviewed and updated list of patient's medical providers Assessment of cognitive impairment was done Assessed patient's functional ability Established a written schedule for health screening [place] Completed and Reviewed  Exercise Activities and Dietary recommendations Goals    None      Immunization History  Administered Date(s) Administered  . Influenza,inj,Quad PF,36+ Mos 07/25/2013, 04/11/2015, 06/04/2016, 08/09/2016  . Influenza-Unspecified 08/20/2014  . Pneumococcal Conjugate-13 08/02/2014  . Pneumococcal Polysaccharide-23 09/04/2010, 09/17/2016  . Pneumococcal-Unspecified 07/26/2000, 09/04/2010  . Tdap 09/04/2010    Health Maintenance  Topic Date Due  . DEXA SCAN  02/28/2011  . INFLUENZA VACCINE  02/23/2017  . MAMMOGRAM  03/05/2017  . HEMOGLOBIN A1C  03/17/2017  . OPHTHALMOLOGY EXAM  04/30/2017  . FOOT EXAM  06/14/2017  . TETANUS/TDAP  09/04/2020  . COLONOSCOPY  09/08/2025  . Hepatitis C Screening  Completed  . PNA vac Low Risk Adult  Completed    Discussed health benefits of physical activity, and encouraged her to engage in regular exercise appropriate for her age and condition.    ------------------------------------------------------------------------------------------------------------  Assessment & Plan:   1. Medicare annual wellness visit, subsequent Measures satified - POCT urinalysis dipstick  2. Essential hypertension controlled - Comprehensive metabolic panel  3. Gastroesophageal reflux disease without esophagitis Stable on healthy diet - CBC with  Differential/Platelet  4. Uncontrolled type 2 diabetes mellitus with hyperosmolarity without coma, without long-term current use of insulin (HCC) Continue current therapy - Hemoglobin A1c - TSH - Microalbumin / creatinine urine ratio  5. Pernicious anemia Continue B12 every 4 months - cyanocobalamin ((VITAMIN B-12)) injection 1,000 mcg; Inject 1 mL (1,000 mcg total) into the muscle once.  6. Mixed hyperlipidemia Continue statins and zetia - Lipid panel  7. Avitaminosis D Will advise if additional supplementation needed - VITAMIN D 25 Hydroxy (Vit-D Deficiency, Fractures)  8. Need for hepatitis C screening test - Hepatitis C antibody  9. Encounter for screening mammogram for breast cancer Schedule at [facility] - MM DIGITAL SCREENING BILATERAL  10. Ovarian failure Schedule with mammogram at [facility] - DG Bone Density   Meds ordered this encounter  Medications  . cyanocobalamin ((VITAMIN B-12)) injection 1,000 mcg    Halina Maidens, MD [facility]  11/05/2016

## 2016-11-06 LAB — LIPID PANEL
CHOL/HDL RATIO: 2.4 ratio (ref 0.0–4.4)
Cholesterol, Total: 133 mg/dL (ref 100–199)
HDL: 55 mg/dL (ref >39)
LDL Calculated: 63 mg/dL (ref 0–99)
Triglycerides: 77 mg/dL (ref 0–149)
VLDL CHOLESTEROL CAL: 15 mg/dL (ref 5–40)

## 2016-11-06 LAB — CBC WITH DIFFERENTIAL/PLATELET
BASOS: 0 %
Basophils Absolute: 0 10*3/uL (ref 0.0–0.2)
EOS (ABSOLUTE): 0.1 10*3/uL (ref 0.0–0.4)
Eos: 1 %
HEMATOCRIT: 40.4 % (ref 34.0–46.6)
Hemoglobin: 12.8 g/dL (ref 11.1–15.9)
Immature Grans (Abs): 0 10*3/uL (ref 0.0–0.1)
Immature Granulocytes: 0 %
LYMPHS ABS: 2.2 10*3/uL (ref 0.7–3.1)
Lymphs: 38 %
MCH: 22.5 pg — AB (ref 26.6–33.0)
MCHC: 31.7 g/dL (ref 31.5–35.7)
MCV: 71 fL — AB (ref 79–97)
MONOS ABS: 0.3 10*3/uL (ref 0.1–0.9)
Monocytes: 4 %
Neutrophils Absolute: 3.3 10*3/uL (ref 1.4–7.0)
Neutrophils: 57 %
PLATELETS: 272 10*3/uL (ref 150–379)
RBC: 5.69 x10E6/uL — ABNORMAL HIGH (ref 3.77–5.28)
RDW: 16.9 % — AB (ref 12.3–15.4)
WBC: 5.9 10*3/uL (ref 3.4–10.8)

## 2016-11-06 LAB — COMPREHENSIVE METABOLIC PANEL
A/G RATIO: 1.9 (ref 1.2–2.2)
ALT: 18 IU/L (ref 0–32)
AST: 15 IU/L (ref 0–40)
Albumin: 4.5 g/dL (ref 3.5–4.8)
Alkaline Phosphatase: 77 IU/L (ref 39–117)
BILIRUBIN TOTAL: 0.4 mg/dL (ref 0.0–1.2)
BUN/Creatinine Ratio: 20 (ref 12–28)
BUN: 21 mg/dL (ref 8–27)
CHLORIDE: 97 mmol/L (ref 96–106)
CO2: 24 mmol/L (ref 18–29)
Calcium: 10.7 mg/dL — ABNORMAL HIGH (ref 8.7–10.3)
Creatinine, Ser: 1.03 mg/dL — ABNORMAL HIGH (ref 0.57–1.00)
GFR calc non Af Amer: 55 mL/min/{1.73_m2} — ABNORMAL LOW (ref >59)
GFR, EST AFRICAN AMERICAN: 64 mL/min/{1.73_m2} (ref >59)
GLOBULIN, TOTAL: 2.4 g/dL (ref 1.5–4.5)
Glucose: 107 mg/dL — ABNORMAL HIGH (ref 65–99)
POTASSIUM: 4.3 mmol/L (ref 3.5–5.2)
SODIUM: 139 mmol/L (ref 134–144)
Total Protein: 6.9 g/dL (ref 6.0–8.5)

## 2016-11-06 LAB — HEMOGLOBIN A1C
ESTIMATED AVERAGE GLUCOSE: 160 mg/dL
Hgb A1c MFr Bld: 7.2 % — ABNORMAL HIGH (ref 4.8–5.6)

## 2016-11-06 LAB — HEPATITIS C ANTIBODY

## 2016-11-06 LAB — VITAMIN D 25 HYDROXY (VIT D DEFICIENCY, FRACTURES): VIT D 25 HYDROXY: 14.8 ng/mL — AB (ref 30.0–100.0)

## 2016-11-06 LAB — TSH: TSH: 1.08 u[IU]/mL (ref 0.450–4.500)

## 2016-11-09 LAB — MICROALBUMIN / CREATININE URINE RATIO
Creatinine, Urine: 57.9 mg/dL
Microalb/Creat Ratio: <5.2 mg/g creat (ref 0.0–30.0)
Microalbumin, Urine: <3 ug/mL

## 2016-11-26 DIAGNOSIS — H2513 Age-related nuclear cataract, bilateral: Secondary | ICD-10-CM | POA: Diagnosis not present

## 2016-11-26 DIAGNOSIS — H1851 Endothelial corneal dystrophy: Secondary | ICD-10-CM | POA: Diagnosis not present

## 2016-11-26 DIAGNOSIS — H40033 Anatomical narrow angle, bilateral: Secondary | ICD-10-CM | POA: Diagnosis not present

## 2016-12-27 ENCOUNTER — Other Ambulatory Visit: Payer: Self-pay | Admitting: Internal Medicine

## 2017-01-04 ENCOUNTER — Telehealth: Payer: Self-pay | Admitting: *Deleted

## 2017-01-04 NOTE — Telephone Encounter (Signed)
Pt is coming in on Thursday, 6/14, for a B12 injection. Asked if Dr. Army Melia could look at her ear while she is here, thinks she might have a keloid. Dr. Army Melia stated she would need O.V. For that. We tried to schedule her while on phone but nothing worked out at this time. Pt will call us back about appt.

## 2017-01-06 ENCOUNTER — Ambulatory Visit (INDEPENDENT_AMBULATORY_CARE_PROVIDER_SITE_OTHER): Payer: Medicare PPO

## 2017-01-06 DIAGNOSIS — D510 Vitamin B12 deficiency anemia due to intrinsic factor deficiency: Secondary | ICD-10-CM | POA: Diagnosis not present

## 2017-01-06 MED ORDER — CYANOCOBALAMIN 1000 MCG/ML IJ SOLN
1000.0000 ug | Freq: Once | INTRAMUSCULAR | Status: AC
  Administered 2017-01-06: 1000 ug via INTRAMUSCULAR

## 2017-01-24 ENCOUNTER — Telehealth: Payer: Self-pay

## 2017-01-24 ENCOUNTER — Other Ambulatory Visit: Payer: Self-pay | Admitting: Internal Medicine

## 2017-01-24 MED ORDER — SITAGLIPTIN PHOS-METFORMIN HCL 50-1000 MG PO TABS: 1.0000 | ORAL_TABLET | Freq: Two times a day (BID) | ORAL | 0 refills | Status: DC

## 2017-01-24 NOTE — Telephone Encounter (Signed)
Done

## 2017-01-24 NOTE — Telephone Encounter (Signed)
Patient called pharmacy- pharmacy called and left voicemail stating patient is completely out of Janumet medication. Would like to know if she can get short supply (or two weeks worth) of this medication sent to [facility] [place] [place] until she receives her reshipment of medication. Please advise.

## 2017-02-03 ENCOUNTER — Ambulatory Visit (INDEPENDENT_AMBULATORY_CARE_PROVIDER_SITE_OTHER): Payer: Medicare PPO

## 2017-02-03 DIAGNOSIS — D510 Vitamin B12 deficiency anemia due to intrinsic factor deficiency: Secondary | ICD-10-CM

## 2017-02-03 MED ORDER — CYANOCOBALAMIN 1000 MCG/ML IJ SOLN
1000.0000 ug | Freq: Once | INTRAMUSCULAR | Status: AC
  Administered 2017-02-03: 1000 ug via INTRAMUSCULAR

## 2017-02-15 DIAGNOSIS — M19011 Primary osteoarthritis, right shoulder: Secondary | ICD-10-CM | POA: Diagnosis not present

## 2017-02-15 DIAGNOSIS — S4991XA Unspecified injury of right shoulder and upper arm, initial encounter: Secondary | ICD-10-CM | POA: Diagnosis not present

## 2017-02-20 DIAGNOSIS — M479 Spondylosis, unspecified: Secondary | ICD-10-CM | POA: Diagnosis not present

## 2017-02-20 DIAGNOSIS — E785 Hyperlipidemia, unspecified: Secondary | ICD-10-CM | POA: Diagnosis not present

## 2017-02-20 DIAGNOSIS — E669 Obesity, unspecified: Secondary | ICD-10-CM | POA: Diagnosis not present

## 2017-02-20 DIAGNOSIS — N182 Chronic kidney disease, stage 2 (mild): Secondary | ICD-10-CM | POA: Diagnosis not present

## 2017-02-20 DIAGNOSIS — I10 Essential (primary) hypertension: Secondary | ICD-10-CM | POA: Diagnosis not present

## 2017-02-20 DIAGNOSIS — K219 Gastro-esophageal reflux disease without esophagitis: Secondary | ICD-10-CM | POA: Diagnosis not present

## 2017-02-20 DIAGNOSIS — E1142 Type 2 diabetes mellitus with diabetic polyneuropathy: Secondary | ICD-10-CM | POA: Diagnosis not present

## 2017-02-20 DIAGNOSIS — E1122 Type 2 diabetes mellitus with diabetic chronic kidney disease: Secondary | ICD-10-CM | POA: Diagnosis not present

## 2017-02-20 DIAGNOSIS — M1712 Unilateral primary osteoarthritis, left knee: Secondary | ICD-10-CM | POA: Diagnosis not present

## 2017-03-11 ENCOUNTER — Ambulatory Visit (INDEPENDENT_AMBULATORY_CARE_PROVIDER_SITE_OTHER): Payer: Medicare PPO | Admitting: Internal Medicine

## 2017-03-11 ENCOUNTER — Encounter: Payer: Self-pay | Admitting: Internal Medicine

## 2017-03-11 VITALS — BP 130/66 | HR 62 | Ht 61.5 in | Wt 171.0 lb

## 2017-03-11 DIAGNOSIS — E1100 Type 2 diabetes mellitus with hyperosmolarity without nonketotic hyperglycemic-hyperosmolar coma (NKHHC): Secondary | ICD-10-CM | POA: Diagnosis not present

## 2017-03-11 DIAGNOSIS — E559 Vitamin D deficiency, unspecified: Secondary | ICD-10-CM

## 2017-03-11 DIAGNOSIS — I10 Essential (primary) hypertension: Secondary | ICD-10-CM | POA: Diagnosis not present

## 2017-03-11 DIAGNOSIS — R350 Frequency of micturition: Secondary | ICD-10-CM

## 2017-03-11 DIAGNOSIS — E538 Deficiency of other specified B group vitamins: Secondary | ICD-10-CM

## 2017-03-11 LAB — POCT URINALYSIS DIPSTICK
Bilirubin, UA: NEGATIVE
Ketones, UA: NEGATIVE
Leukocytes, UA: NEGATIVE
NITRITE UA: NEGATIVE
Protein, UA: NEGATIVE
RBC UA: NEGATIVE
Spec Grav, UA: <=1.005 — AB (ref 1.010–1.025)
UROBILINOGEN UA: 0.2 U/dL
pH, UA: 7.5 (ref 5.0–8.0)

## 2017-03-11 MED ORDER — CYANOCOBALAMIN 1000 MCG/ML IJ SOLN
1000.0000 ug | Freq: Once | INTRAMUSCULAR | Status: AC
  Administered 2017-03-11: 1000 ug via INTRAMUSCULAR

## 2017-03-11 NOTE — Progress Notes (Signed)
Date:  03/11/2017   Name:  Sheila Sandoval   DOB:  Sep 07, 1945   MRN:  [id number]   Chief Complaint: Hypertension; Diabetes (BS- 73 [address]. ); and Dark Urine (Noticing change in urine. States sometimes its dark and then other time is light. Has been cloudy. Wants to make sure nothing is going on. ) Hypertension  This is a chronic problem. The problem is unchanged. Pertinent negatives include no chest pain, headaches, palpitations or shortness of breath.  Diabetes  She presents for her follow-up diabetic visit. She has type 2 diabetes mellitus. Pertinent negatives for hypoglycemia include no headaches or tremors. Pertinent negatives for diabetes include no chest pain, no fatigue, no polydipsia and no polyuria.  Urinary frequency - no dysuria bleeding fever chills or flank pain. She is on Jardiance but denies vaginal itching or yeast infection symptoms.  Lab Results  Component Value Date   HGBA1C 7.2 (H) 11/05/2016   Lab Results  Component Value Date   CREATININE 1.03 (H) 11/05/2016     Review of Systems  Constitutional: Negative for appetite change, fatigue, fever and unexpected weight change.  HENT: Negative for tinnitus and trouble swallowing.   Eyes: Negative for visual disturbance.  Respiratory: Negative for cough, chest tightness and shortness of breath.   Cardiovascular: Negative for chest pain, palpitations and leg swelling.  Gastrointestinal: Negative for abdominal pain.  Endocrine: Negative for polydipsia and polyuria.  Genitourinary: Positive for frequency. Negative for dysuria and hematuria.  Musculoskeletal: Positive for arthralgias (fell and hurt neck and elbow).  Neurological: Negative for tremors, numbness and headaches.  Hematological: Negative for adenopathy.  Psychiatric/Behavioral: Negative for dysphoric mood and sleep disturbance.    Patient Active Problem List   Diagnosis Date Noted  . Hypercalcemia 10/02/2016  . Uncontrolled type 2 diabetes mellitus  with hyperosmolarity without coma, without long-term current use of insulin ([place]) 10/23/2015  . Encephalocele ([place]) 08/01/2015  . Pernicious anemia 04/11/2015  . Carpal tunnel syndrome 01/14/2015  . HLD (hyperlipidemia) 01/14/2015  . Essential hypertension 01/14/2015  . Climacteric 01/14/2015  . Muscle fatigue 01/14/2015  . Arthritis of hand, degenerative 01/14/2015  . Avitaminosis D 01/14/2015  . GERD (gastroesophageal reflux disease) 10/29/2011  . Spondylolisthesis 10/29/2011    Prior to Admission medications   Medication Sig Start Date End Date Taking? Authorizing Provider  amLODipine (NORVASC) 10 MG tablet Take 1 tablet (10 mg total) by mouth daily. 10/19/16  Yes Glean Hess, MD  ammonium lactate (AMLACTIN) 12 % cream Apply topically as needed for dry skin.   Yes [provider]  aspirin 81 MG tablet Take 1 tablet by mouth daily.   Yes [provider]  Blood Glucose Calibration (ACCU-CHEK AVIVA) SOLN 1 each by In Vitro route daily as needed. 10/20/16  Yes Glean Hess, MD  Blood Glucose Monitoring Suppl (ACCU-CHEK AVIVA PLUS) w/Device KIT USE TWICE DAILY 12/28/16  Yes Glean Hess, MD  diclofenac sodium (VOLTAREN) 1 % GEL Apply 2 g topically 4 (four) times daily. 08/06/15  Yes Glean Hess, MD  empagliflozin (JARDIANCE) 25 MG TABS tablet Take 25 mg by mouth daily. 10/19/16  Yes Glean Hess, MD  estradiol (ESTRACE) 0.5 MG tablet Take 1 tablet (0.5 mg total) by mouth 3 (three) times a week. 11/03/16  Yes Glean Hess, MD  ezetimibe (ZETIA) 10 MG tablet Take 1 tablet (10 mg total) by mouth daily. 10/19/16  Yes Glean Hess, MD  Ferrous Gluconate 325 (36 FE) MG TABS Take  by mouth.   Yes [provider]  fluticasone (FLONASE) 50 MCG/ACT nasal spray Place 2 sprays into both nostrils daily. 10/20/16  Yes Glean Hess, MD  glimepiride (AMARYL) 2 MG tablet Take 1 tablet (2 mg total) by mouth daily with breakfast. 10/19/16  Yes Glean Hess, MD  glucose blood (ACCU-CHEK AVIVA PLUS) test strip 1 each by Other route 2 (two) times daily. 10/20/16  Yes Glean Hess, MD  ibuprofen (ADVIL,MOTRIN) 800 MG tablet Take 1 tablet by mouth 3 (three) times daily. 08/14/13  Yes [provider]  Lancet Devices [facility]) lancets 1 each by Other route 2 (two) times daily. 10/20/16  Yes Glean Hess, MD  meloxicam (MOBIC) 15 MG tablet Take 1 tablet (15 mg total) by mouth daily. 10/20/16  Yes Glean Hess, MD  ramipril (ALTACE) 5 MG capsule Take 1 capsule (5 mg total) by mouth daily. 10/19/16  Yes Glean Hess, MD  rosuvastatin (CRESTOR) 5 MG tablet Take 1 tablet (5 mg total) by mouth daily. 10/19/16  Yes Glean Hess, MD  sitaGLIPtin-metformin (JANUMET) 50-1000 MG tablet Take 1 tablet by mouth 2 (two) times daily with a meal. 10/19/16  Yes Glean Hess, MD  sitaGLIPtin-metformin (JANUMET) 50-1000 MG tablet Take 1 tablet by mouth 2 (two) times daily with a meal. 01/24/17  Yes Glean Hess, MD  triamcinolone cream (KENALOG) 0.1 % Apply 1 application topically 2 (two) times daily.   Yes [provider]  triamterene-hydrochlorothiazide (MAXZIDE-25) 37.5-25 MG tablet Take 1 tablet by mouth daily. 10/19/16  Yes Glean Hess, MD    Allergies  Allergen Reactions  . Atorvastatin     chest tightness  . Tramadol Hcl Nausea And Vomiting    Past Surgical History:  Procedure Laterality Date  . ABDOMINAL HYSTERECTOMY  1981  . APPENDECTOMY  1981  . REPAIR DURAL / CSF LEAK  2002    Social History  Substance Use Topics  . Smoking status: Never Smoker  . Smokeless tobacco: Never Used  . Alcohol use No   Urine dipstick shows negative for all components.  Micro exam: not done.   Medication list has been reviewed and updated.   Physical Exam  Constitutional: She is oriented to person, place, and time. She appears well-developed. No distress.  HENT:  Head: Normocephalic and atraumatic.    Neck: Normal range of motion. Neck supple. Carotid bruit is not present.  Cardiovascular: Normal rate, regular rhythm and normal heart sounds.   Pulmonary/Chest: Effort normal and breath sounds normal. No respiratory distress.  Abdominal: Soft. Bowel sounds are normal. There is no tenderness.  Musculoskeletal: She exhibits no edema or tenderness.  Neurological: She is alert and oriented to person, place, and time.  Skin: Skin is warm and dry. No rash noted.  Psychiatric: She has a normal mood and affect. Her behavior is normal. Thought content normal.  Nursing note and vitals reviewed.   BP 130/66   Pulse 62   Ht 5' 1.5" (1.562 m)   Wt 171 lb (77.6 kg)   SpO2 100%   BMI 31.79 kg/m   Assessment and Plan: 1. Essential hypertension controlled - Comprehensive metabolic panel  2. Uncontrolled type 2 diabetes mellitus with hyperosmolarity without coma, without long-term current use of insulin (HCC) Continue current medications - Hemoglobin A1c  3. Urinary frequency - POCT urinalysis dipstick  4. Vitamin D deficiency Begin supplement 1000 IU daily  5. Hypercalcemia Recheck labs May need to stop diuretic -  Comprehensive metabolic panel   No orders of the defined types were placed in this encounter.   Halina Maidens, MD [facility]  03/11/2017

## 2017-03-11 NOTE — Addendum Note (Signed)
Addended by: Anthonette Legato on: 03/11/2017 11:05 AM   Modules accepted: Orders

## 2017-03-11 NOTE — Patient Instructions (Signed)
Begin Vitamin D supplement - 1000 IU daily

## 2017-03-12 LAB — COMPREHENSIVE METABOLIC PANEL
ALT: 21 IU/L (ref 0–32)
AST: 22 IU/L (ref 0–40)
Albumin/Globulin Ratio: 1.8 (ref 1.2–2.2)
Albumin: 4.5 g/dL (ref 3.5–4.8)
Alkaline Phosphatase: 82 IU/L (ref 39–117)
BUN/Creatinine Ratio: 17 (ref 12–28)
BUN: 17 mg/dL (ref 8–27)
Bilirubin Total: 0.4 mg/dL (ref 0.0–1.2)
CALCIUM: 10.4 mg/dL — AB (ref 8.7–10.3)
CO2: 25 mmol/L (ref 20–29)
CREATININE: 1.01 mg/dL — AB (ref 0.57–1.00)
Chloride: 100 mmol/L (ref 96–106)
GFR calc Af Amer: 65 mL/min/{1.73_m2} (ref >59)
GFR, EST NON AFRICAN AMERICAN: 56 mL/min/{1.73_m2} — AB (ref >59)
GLOBULIN, TOTAL: 2.5 g/dL (ref 1.5–4.5)
GLUCOSE: 118 mg/dL — AB (ref 65–99)
Potassium: 4.4 mmol/L (ref 3.5–5.2)
Sodium: 141 mmol/L (ref 134–144)
Total Protein: 7 g/dL (ref 6.0–8.5)

## 2017-03-12 LAB — HEMOGLOBIN A1C
Est. average glucose Bld gHb Est-mCnc: 174 mg/dL
HEMOGLOBIN A1C: 7.7 % — AB (ref 4.8–5.6)

## 2017-03-23 ENCOUNTER — Other Ambulatory Visit: Payer: Self-pay

## 2017-03-23 MED ORDER — DICLOFENAC SODIUM 1 % TD GEL
2.0000 g | Freq: Four times a day (QID) | TRANSDERMAL | 3 refills | Status: DC
Start: 2017-03-23 — End: 2018-05-16

## 2017-05-04 ENCOUNTER — Ambulatory Visit (INDEPENDENT_AMBULATORY_CARE_PROVIDER_SITE_OTHER): Payer: Medicare PPO

## 2017-05-04 DIAGNOSIS — D510 Vitamin B12 deficiency anemia due to intrinsic factor deficiency: Secondary | ICD-10-CM | POA: Diagnosis not present

## 2017-05-04 DIAGNOSIS — Z23 Encounter for immunization: Secondary | ICD-10-CM

## 2017-05-04 MED ORDER — CYANOCOBALAMIN 1000 MCG/ML IJ SOLN
1000.0000 ug | Freq: Once | INTRAMUSCULAR | Status: AC
  Administered 2017-05-04: 1000 ug via INTRAMUSCULAR

## 2017-05-24 ENCOUNTER — Telehealth: Payer: Self-pay

## 2017-05-24 DIAGNOSIS — W57XXXA Bitten or stung by nonvenomous insect and other nonvenomous arthropods, initial encounter: Secondary | ICD-10-CM | POA: Diagnosis not present

## 2017-05-24 DIAGNOSIS — S80862A Insect bite (nonvenomous), left lower leg, initial encounter: Secondary | ICD-10-CM | POA: Diagnosis not present

## 2017-05-24 DIAGNOSIS — S8012XA Contusion of left lower leg, initial encounter: Secondary | ICD-10-CM | POA: Diagnosis not present

## 2017-05-24 NOTE — Telephone Encounter (Signed)
Patient informed via VM to try cortisone cream. If does not help, then call tomorrow to get put on schedule.

## 2017-05-24 NOTE — Telephone Encounter (Signed)
Patient called stating woke up this morning with "spot on leg". Looks like a bruise but is itching and now red from the scratching. No swelling or tenderness around the area. No fever. Wants advise on what to do and if can wait for an appt for tomorrow? Please Advise.

## 2017-05-24 NOTE — Telephone Encounter (Signed)
Can come tomorrow or today at 4 pm if she wants.  Otherwise, use cortisone cream as needed for itching.

## 2017-05-27 DIAGNOSIS — Z13820 Encounter for screening for osteoporosis: Secondary | ICD-10-CM | POA: Diagnosis not present

## 2017-05-27 DIAGNOSIS — Z803 Family history of malignant neoplasm of breast: Secondary | ICD-10-CM | POA: Diagnosis not present

## 2017-05-27 DIAGNOSIS — H1851 Endothelial corneal dystrophy: Secondary | ICD-10-CM | POA: Diagnosis not present

## 2017-05-27 DIAGNOSIS — Z1231 Encounter for screening mammogram for malignant neoplasm of breast: Secondary | ICD-10-CM | POA: Diagnosis not present

## 2017-05-27 DIAGNOSIS — E2839 Other primary ovarian failure: Secondary | ICD-10-CM | POA: Diagnosis not present

## 2017-05-27 DIAGNOSIS — H2513 Age-related nuclear cataract, bilateral: Secondary | ICD-10-CM | POA: Diagnosis not present

## 2017-05-27 LAB — HM DIABETES EYE EXAM

## 2017-07-15 ENCOUNTER — Encounter: Payer: Self-pay | Admitting: Internal Medicine

## 2017-07-15 ENCOUNTER — Ambulatory Visit: Payer: Medicare PPO | Admitting: Internal Medicine

## 2017-07-15 ENCOUNTER — Other Ambulatory Visit: Payer: Self-pay | Admitting: Internal Medicine

## 2017-07-15 VITALS — BP 128/68 | HR 68 | Ht 61.5 in | Wt 168.0 lb

## 2017-07-15 DIAGNOSIS — S0003XA Contusion of scalp, initial encounter: Secondary | ICD-10-CM | POA: Diagnosis not present

## 2017-07-15 DIAGNOSIS — I10 Essential (primary) hypertension: Secondary | ICD-10-CM | POA: Diagnosis not present

## 2017-07-15 DIAGNOSIS — D510 Vitamin B12 deficiency anemia due to intrinsic factor deficiency: Secondary | ICD-10-CM

## 2017-07-15 DIAGNOSIS — Z23 Encounter for immunization: Secondary | ICD-10-CM | POA: Diagnosis not present

## 2017-07-15 DIAGNOSIS — E1100 Type 2 diabetes mellitus with hyperosmolarity without nonketotic hyperglycemic-hyperosmolar coma (NKHHC): Secondary | ICD-10-CM | POA: Diagnosis not present

## 2017-07-15 DIAGNOSIS — H40039 Anatomical narrow angle, unspecified eye: Secondary | ICD-10-CM | POA: Insufficient documentation

## 2017-07-15 DIAGNOSIS — E538 Deficiency of other specified B group vitamins: Secondary | ICD-10-CM

## 2017-07-15 DIAGNOSIS — R300 Dysuria: Secondary | ICD-10-CM

## 2017-07-15 MED ORDER — CYANOCOBALAMIN 1000 MCG/ML IJ SOLN
1000.0000 ug | Freq: Once | INTRAMUSCULAR | Status: AC
  Administered 2017-07-15: 1000 ug via INTRAMUSCULAR

## 2017-07-15 MED ORDER — ZOSTER VAC RECOMB ADJUVANTED 50 MCG/0.5ML IM SUSR: 0.5000 mL | Freq: Once | INTRAMUSCULAR | 1 refills | Status: AC

## 2017-07-15 NOTE — Progress Notes (Signed)
Date:  07/15/2017   Name:  Sheila Sandoval   DOB:  08/19/1945   MRN:  [id number]   Chief Complaint: Diabetes; Hypertension; Urinary Tract Infection (Pelvic pressure- urgency, and urine odor. ); and Fall (Fell yesterday and hit back of head on bathtub. Has not. Been using ice to take swelling down. Wants checked. )  Diabetes  She presents for her follow-up diabetic visit. She has type 2 diabetes mellitus. Her disease course has been stable. Pertinent negatives for hypoglycemia include no dizziness, headaches or tremors. Associated symptoms include blurred vision, foot ulcerations and weakness. Pertinent negatives for diabetes include no chest pain, no fatigue, no polydipsia and no polyuria. Current diabetic treatment includes oral agent (triple therapy) (jardiance, metformin, januvia and amaryl). She is compliant with treatment all of the time. She monitors urine at home 1-2 x per month. Her breakfast blood glucose is taken between 7-8 am. Her breakfast blood glucose range is generally 140-180 mg/dl. An ACE inhibitor/angiotensin II receptor blocker is being taken. Eye exam is current.  Hypertension  This is a chronic problem. The problem is unchanged. The problem is controlled. Associated symptoms include blurred vision. Pertinent negatives include no chest pain, headaches, neck pain, palpitations or shortness of breath. Past treatments include calcium channel blockers and diuretics.  Dysuria   This is a new problem. The patient is experiencing no pain. There has been no fever. Pertinent negatives include no hematuria. Associated symptoms comments: Foul odor to urine constantly.  Head injury - fell yesterday in the bathroom and hit her head on the side of the tub.  No LOC, no HA, dizziness, nausea or blurred vision.  She is due for her B12 injection and she wants to get the new shingles vaccine.  Review of Systems  Constitutional: Negative for appetite change, fatigue, fever and unexpected  weight change.  HENT: Negative for tinnitus and trouble swallowing.   Eyes: Positive for blurred vision. Negative for visual disturbance.  Respiratory: Negative for cough, chest tightness and shortness of breath.   Cardiovascular: Negative for chest pain, palpitations and leg swelling.  Gastrointestinal: Negative for abdominal pain.  Endocrine: Negative for polydipsia and polyuria.  Genitourinary: Positive for dysuria. Negative for hematuria.  Musculoskeletal: Positive for arthralgias (knee strain on right). Negative for gait problem, joint swelling, neck pain and neck stiffness.       Tender posterior right scalp  Neurological: Positive for weakness. Negative for dizziness, tremors, numbness and headaches.  Psychiatric/Behavioral: Negative for agitation, dysphoric mood and sleep disturbance.    Patient Active Problem List   Diagnosis Date Noted  . Vitamin D deficiency 03/11/2017  . Hypercalcemia 10/02/2016  . Uncontrolled type 2 diabetes mellitus with hyperosmolarity without coma, without long-term current use of insulin ([place]) 10/23/2015  . Encephalocele ([place]) 08/01/2015  . Pernicious anemia 04/11/2015  . Carpal tunnel syndrome 01/14/2015  . HLD (hyperlipidemia) 01/14/2015  . Essential hypertension 01/14/2015  . Climacteric 01/14/2015  . Muscle fatigue 01/14/2015  . Arthritis of hand, degenerative 01/14/2015  . GERD (gastroesophageal reflux disease) 10/29/2011  . Spondylolisthesis 10/29/2011    Prior to Admission medications   Medication Sig Start Date End Date Taking? Authorizing Provider  amLODipine (NORVASC) 10 MG tablet Take 1 tablet (10 mg total) by mouth daily. 10/19/16  Yes Glean Hess, MD  ammonium lactate (AMLACTIN) 12 % cream Apply topically as needed for dry skin.   Yes [provider]  aspirin 81 MG tablet Take 1 tablet by mouth daily.   Yes  [provider]  Blood Glucose Calibration (ACCU-CHEK AVIVA) SOLN 1 each by In Vitro route daily as  needed. 10/20/16  Yes Glean Hess, MD  Blood Glucose Monitoring Suppl (ACCU-CHEK AVIVA PLUS) w/Device KIT USE TWICE DAILY 12/28/16  Yes Glean Hess, MD  diclofenac sodium (VOLTAREN) 1 % GEL Apply 2 g topically 4 (four) times daily. 03/23/17  Yes Glean Hess, MD  empagliflozin (JARDIANCE) 25 MG TABS tablet Take 25 mg by mouth daily. 10/19/16  Yes Glean Hess, MD  estradiol (ESTRACE) 0.5 MG tablet Take 1 tablet (0.5 mg total) by mouth 3 (three) times a week. 11/03/16  Yes Glean Hess, MD  ezetimibe (ZETIA) 10 MG tablet Take 1 tablet (10 mg total) by mouth daily. 10/19/16  Yes Glean Hess, MD  Ferrous Gluconate 325 (36 FE) MG TABS Take by mouth.   Yes [provider]  fluticasone (FLONASE) 50 MCG/ACT nasal spray Place 2 sprays into both nostrils daily. 10/20/16  Yes Glean Hess, MD  glimepiride (AMARYL) 2 MG tablet Take 1 tablet (2 mg total) by mouth daily with breakfast. 10/19/16  Yes Glean Hess, MD  glucose blood (ACCU-CHEK AVIVA PLUS) test strip 1 each by Other route 2 (two) times daily. 10/20/16  Yes Glean Hess, MD  ibuprofen (ADVIL,MOTRIN) 800 MG tablet Take 1 tablet by mouth 3 (three) times daily. 08/14/13  Yes [provider]  Lancet Devices [facility]) lancets 1 each by Other route 2 (two) times daily. 10/20/16  Yes Glean Hess, MD  meloxicam (MOBIC) 15 MG tablet Take 1 tablet (15 mg total) by mouth daily. 10/20/16  Yes Glean Hess, MD  ramipril (ALTACE) 5 MG capsule Take 1 capsule (5 mg total) by mouth daily. 10/19/16  Yes Glean Hess, MD  rosuvastatin (CRESTOR) 5 MG tablet Take 1 tablet (5 mg total) by mouth daily. 10/19/16  Yes Glean Hess, MD  sitaGLIPtin-metformin (JANUMET) 50-1000 MG tablet Take 1 tablet by mouth 2 (two) times daily with a meal. 10/19/16  Yes Glean Hess, MD  triamcinolone cream (KENALOG) 0.1 % Apply 1 application topically 2 (two) times daily.   Yes [provider]    triamterene-hydrochlorothiazide (MAXZIDE-25) 37.5-25 MG tablet Take 1 tablet by mouth daily. 10/19/16  Yes Glean Hess, MD    Allergies  Allergen Reactions  . Atorvastatin     chest tightness  . Tramadol Hcl Nausea And Vomiting    Past Surgical History:  Procedure Laterality Date  . ABDOMINAL HYSTERECTOMY  1981  . APPENDECTOMY  1981  . REPAIR DURAL / CSF LEAK  2002    Social History   Tobacco Use  . Smoking status: Never Smoker  . Smokeless tobacco: Never Used  Substance Use Topics  . Alcohol use: No    Alcohol/week: 0.0 oz  . Drug use: No     Medication list has been reviewed and updated.  PHQ 2/9 Scores 11/05/2016 10/23/2015 04/11/2015  PHQ - 2 Score 0 0 0    Physical Exam  Constitutional: She is oriented to person, place, and time. She appears well-developed. She is active. No distress.  HENT:  Head: Normocephalic and atraumatic.  Eyes: EOM are normal. Pupils are equal, round, and reactive to light.  Neck: Normal range of motion and full passive range of motion without pain. Neck supple. No thyromegaly present.  Cardiovascular: Normal rate, regular rhythm and normal heart sounds.  Pulmonary/Chest: Effort normal and breath sounds normal. No respiratory  distress. She has no wheezes.  Abdominal: Soft.  Musculoskeletal: Normal range of motion. She exhibits no edema.       Right knee: She exhibits no swelling, no effusion and no ecchymosis. Tenderness found.  Mild crepitus both knees  Neurological: She is alert and oriented to person, place, and time.  Skin: Skin is warm and dry. No rash noted.  Psychiatric: She has a normal mood and affect. Her speech is normal and behavior is normal. Thought content normal. Cognition and memory are normal.  Nursing note and vitals reviewed.   BP 128/68   Pulse 68   Ht 5' 1.5" (1.562 m)   Wt 168 lb (76.2 kg)   SpO2 98%   BMI 31.23 kg/m   Assessment and Plan: 1. Essential hypertension controlled  2. Uncontrolled type  2 diabetes mellitus with hyperosmolarity without coma, without long-term current use of insulin (HCC) Continue current therapy - Hemoglobin A1c - Microalbumin / creatinine urine ratio - Renal function panel  3. Dysuria No evidence of infection Odor may be due to medications  4. Scalp hematoma, initial encounter Pt reassured - no s/s of concussion  5. Pernicious anemia B12 today  6. Need for shingles vaccine - Zoster Vaccine Adjuvanted [facility]) injection; Inject 0.5 mLs into the muscle once for 1 dose.  Dispense: 0.5 mL; Refill: 1   Meds ordered this encounter  Medications  . Zoster Vaccine Adjuvanted [facility]) injection    Sig: Inject 0.5 mLs into the muscle once for 1 dose.    Dispense:  0.5 mL    Refill:  1    Partially dictated using Editor, commissioning. Any errors are unintentional.  Halina Maidens, MD [facility]  07/15/2017

## 2017-07-16 LAB — HEMOGLOBIN A1C
ESTIMATED AVERAGE GLUCOSE: 180 mg/dL
HEMOGLOBIN A1C: 7.9 % — AB (ref 4.8–5.6)

## 2017-07-16 LAB — RENAL FUNCTION PANEL
Albumin: 4.6 g/dL (ref 3.5–4.8)
BUN / CREAT RATIO: 17 (ref 12–28)
BUN: 18 mg/dL (ref 8–27)
CALCIUM: 10.3 mg/dL (ref 8.7–10.3)
CHLORIDE: 101 mmol/L (ref 96–106)
CO2: 23 mmol/L (ref 20–29)
Creatinine, Ser: 1.08 mg/dL — ABNORMAL HIGH (ref 0.57–1.00)
GFR calc Af Amer: 60 mL/min/{1.73_m2} (ref >59)
GFR calc non Af Amer: 52 mL/min/{1.73_m2} — ABNORMAL LOW (ref >59)
Glucose: 105 mg/dL — ABNORMAL HIGH (ref 65–99)
Phosphorus: 3.2 mg/dL (ref 2.5–4.5)
Potassium: 4.8 mmol/L (ref 3.5–5.2)
SODIUM: 142 mmol/L (ref 134–144)

## 2017-07-20 ENCOUNTER — Ambulatory Visit: Payer: Medicare PPO | Admitting: Internal Medicine

## 2017-07-22 LAB — MICROALBUMIN / CREATININE URINE RATIO
Creatinine, Urine: 43.7 mg/dL
Microalb/Creat Ratio: 7.1 mg/g creat (ref 0.0–30.0)
Microalbumin, Urine: 3.1 ug/mL

## 2017-07-27 ENCOUNTER — Ambulatory Visit: Payer: Medicare PPO | Admitting: Internal Medicine

## 2017-07-29 ENCOUNTER — Encounter: Payer: Self-pay | Admitting: Internal Medicine

## 2017-08-02 ENCOUNTER — Ambulatory Visit: Payer: Medicare PPO | Admitting: Internal Medicine

## 2017-08-08 ENCOUNTER — Ambulatory Visit (INDEPENDENT_AMBULATORY_CARE_PROVIDER_SITE_OTHER): Payer: Medicare PPO | Admitting: Internal Medicine

## 2017-08-08 ENCOUNTER — Encounter: Payer: Self-pay | Admitting: Internal Medicine

## 2017-08-08 VITALS — BP 118/68 | HR 72 | Ht 61.5 in | Wt 168.0 lb

## 2017-08-08 DIAGNOSIS — R21 Rash and other nonspecific skin eruption: Secondary | ICD-10-CM

## 2017-08-08 DIAGNOSIS — M62838 Other muscle spasm: Secondary | ICD-10-CM | POA: Diagnosis not present

## 2017-08-08 DIAGNOSIS — M7121 Synovial cyst of popliteal space [Baker], right knee: Secondary | ICD-10-CM | POA: Diagnosis not present

## 2017-08-08 MED ORDER — TRIAMCINOLONE ACETONIDE 0.1 % EX CREA: 1.0000 "application " | TOPICAL_CREAM | Freq: Two times a day (BID) | CUTANEOUS | 0 refills | Status: DC

## 2017-08-08 NOTE — Progress Notes (Signed)
Date:  08/08/2017   Name:  Sheila Sandoval   DOB:  1946-07-11   MRN:  [id number]   Chief Complaint: Neck Pain (fell several weeks ago- throbbing pain. Dizziness. Gotten better. When fell was getting in bathtub and hit head and neck on bathtub. Felt like whole weight fell on head. ) and Rash (On stomach and neck. Change's soaps alot based on smell. Neck and stomach. itching.) Neck Pain   This is a new problem. The current episode started 1 to 4 weeks ago. The problem occurs constantly. The problem has been unchanged. The pain is associated with a fall (on 07/14/17 at home - fell in bathtub). The pain is present in the midline. Pertinent negatives include no fever or headaches.  Rash  This is a new problem. The current episode started in the past 7 days. The problem is unchanged. The affected locations include the abdomen and chest. Pertinent negatives include no congestion, fatigue, fever or shortness of breath. Past treatments include nothing.  Knee Pain   There was no injury mechanism. The pain is present in the right knee. The quality of the pain is described as aching. The pain is mild. The pain has been fluctuating since onset. She reports no foreign bodies present. The symptoms are aggravated by weight bearing. She has tried heat for the symptoms. The treatment provided moderate relief.      Review of Systems  Constitutional: Negative for chills, fatigue and fever.  HENT: Negative for congestion.   Respiratory: Negative for chest tightness, shortness of breath and wheezing.   Cardiovascular: Negative for palpitations.  Gastrointestinal: Negative for abdominal pain.  Genitourinary: Negative for dysuria.  Musculoskeletal: Positive for arthralgias, joint swelling and neck pain. Negative for back pain and gait problem.  Skin: Positive for rash.  Neurological: Positive for light-headedness (one episode last week). Negative for dizziness and headaches.  Hematological: Negative for  adenopathy.    Patient Active Problem List   Diagnosis Date Noted  . Anatomical narrow angle glaucoma with borderline intraocular pressure 07/15/2017  . Vitamin D deficiency 03/11/2017  . Hypercalcemia 10/02/2016  . Uncontrolled type 2 diabetes mellitus with hyperosmolarity without coma, without long-term current use of insulin ([place]) 10/23/2015  . Pernicious anemia 04/11/2015  . Carpal tunnel syndrome 01/14/2015  . HLD (hyperlipidemia) 01/14/2015  . Essential hypertension 01/14/2015  . Climacteric 01/14/2015  . Muscle fatigue 01/14/2015  . Arthritis of hand, degenerative 01/14/2015  . GERD (gastroesophageal reflux disease) 10/29/2011  . Spondylolisthesis 10/29/2011    Prior to Admission medications   Medication Sig Start Date End Date Taking? Authorizing Provider  amLODipine (NORVASC) 10 MG tablet Take 1 tablet (10 mg total) by mouth daily. 10/19/16   Glean Hess, MD  ammonium lactate (AMLACTIN) 12 % cream Apply topically as needed for dry skin.    [provider]  aspirin 81 MG tablet Take 1 tablet by mouth daily.    [provider]  Blood Glucose Calibration (ACCU-CHEK AVIVA) SOLN 1 each by In Vitro route daily as needed. 10/20/16   Glean Hess, MD  Blood Glucose Monitoring Suppl (ACCU-CHEK AVIVA PLUS) w/Device KIT USE TWICE DAILY 12/28/16   Glean Hess, MD  diclofenac sodium (VOLTAREN) 1 % GEL Apply 2 g topically 4 (four) times daily. 03/23/17   Glean Hess, MD  empagliflozin (JARDIANCE) 25 MG TABS tablet Take 25 mg by mouth daily. 10/19/16   Glean Hess, MD  estradiol (ESTRACE) 0.5 MG tablet Take 1 tablet (  0.5 mg total) by mouth 3 (three) times a week. 11/03/16   Glean Hess, MD  ezetimibe (ZETIA) 10 MG tablet Take 1 tablet (10 mg total) by mouth daily. 10/19/16   Glean Hess, MD  Ferrous Gluconate 325 (36 FE) MG TABS Take by mouth.    [provider]  fluticasone (FLONASE) 50 MCG/ACT nasal spray Place 2 sprays into both  nostrils daily. 10/20/16   Glean Hess, MD  glimepiride (AMARYL) 2 MG tablet Take 1 tablet (2 mg total) by mouth daily with breakfast. 10/19/16   Glean Hess, MD  glucose blood (ACCU-CHEK AVIVA PLUS) test strip 1 each by Other route 2 (two) times daily. 10/20/16   Glean Hess, MD  ibuprofen (ADVIL,MOTRIN) 800 MG tablet Take 1 tablet by mouth 3 (three) times daily. 08/14/13   [provider]  Lancet Devices [facility]) lancets 1 each by Other route 2 (two) times daily. 10/20/16   Glean Hess, MD  meloxicam (MOBIC) 15 MG tablet Take 1 tablet (15 mg total) by mouth daily. 10/20/16   Glean Hess, MD  ramipril (ALTACE) 5 MG capsule Take 1 capsule (5 mg total) by mouth daily. 10/19/16   Glean Hess, MD  rosuvastatin (CRESTOR) 5 MG tablet Take 1 tablet (5 mg total) by mouth daily. 10/19/16   Glean Hess, MD  sitaGLIPtin-metformin (JANUMET) 50-1000 MG tablet Take 1 tablet by mouth 2 (two) times daily with a meal. 10/19/16   Glean Hess, MD  triamcinolone cream (KENALOG) 0.1 % Apply 1 application topically 2 (two) times daily.    [provider]  triamterene-hydrochlorothiazide (MAXZIDE-25) 37.5-25 MG tablet Take 1 tablet by mouth daily. 10/19/16   Glean Hess, MD    Allergies  Allergen Reactions  . Atorvastatin     chest tightness  . Tramadol Hcl Nausea And Vomiting    Past Surgical History:  Procedure Laterality Date  . ABDOMINAL HYSTERECTOMY  1981  . APPENDECTOMY  1981  . REPAIR DURAL / CSF LEAK  2002    Social History   Tobacco Use  . Smoking status: Never Smoker  . Smokeless tobacco: Never Used  Substance Use Topics  . Alcohol use: No    Alcohol/week: 0.0 oz  . Drug use: No     Medication list has been reviewed and updated.  PHQ 2/9 Scores 11/05/2016 10/23/2015 04/11/2015  PHQ - 2 Score 0 0 0    Physical Exam  Constitutional: She is oriented to person, place, and time. She appears well-developed. No  distress.  HENT:  Head: Normocephalic and atraumatic.  Cardiovascular: Normal rate and normal heart sounds.  Pulmonary/Chest: Effort normal and breath sounds normal. No respiratory distress. She has no wheezes.  Musculoskeletal: She exhibits no edema.       Right knee: She exhibits no swelling (mild posterior fullness c/w bakers cyst).       Cervical back: She exhibits decreased range of motion and spasm.  Neurological: She is alert and oriented to person, place, and time. She has normal strength. No cranial nerve deficit or sensory deficit. Coordination and gait normal.  Skin: Skin is warm and dry. No rash noted.     Psychiatric: She has a normal mood and affect. Her behavior is normal. Thought content normal.  Nursing note and vitals reviewed.   BP 118/68   Pulse 72   Ht 5' 1.5" (1.562 m)   Wt 168 lb (76.2 kg)   SpO2 99%  BMI 31.23 kg/m   Assessment and Plan: 1. Muscle spasm Heat and voltaren gel  2. Rash and nonspecific skin eruption - triamcinolone cream (KENALOG) 0.1 %; Apply 1 application topically 2 (two) times daily.  Dispense: 30 g; Refill: 0  3. Baker's cyst of knee, right Continue heat and add voltaren gel   Meds ordered this encounter  Medications  . triamcinolone cream (KENALOG) 0.1 %    Sig: Apply 1 application topically 2 (two) times daily.    Dispense:  30 g    Refill:  0    Partially dictated using Editor, commissioning. Any errors are unintentional.  Halina Maidens, MD [facility]  08/08/2017

## 2017-08-08 NOTE — Patient Instructions (Signed)
Use Voltaren gel on knee and neck  Use heat on posterior neck and right shoulder

## 2017-09-05 ENCOUNTER — Other Ambulatory Visit: Payer: Self-pay | Admitting: Pharmacist

## 2017-09-05 NOTE — Patient Outreach (Signed)
Outreach call to Sheila Sandoval to complete medication review for [facility] quality metric. Called and spoke with patient. HIPAA identifiers verified and verbal consent received. Scheduled appointment to complete medication review via phone on 09/07/17 at 10 am.  Harlow Asa, PharmD, [facility] [phone]

## 2017-09-07 ENCOUNTER — Other Ambulatory Visit: Payer: Self-pay | Admitting: Pharmacist

## 2017-09-07 DIAGNOSIS — I10 Essential (primary) hypertension: Secondary | ICD-10-CM

## 2017-09-07 NOTE — Patient Outreach (Signed)
Outreach call to [name] as scheduled to complete medication review for [facility] quality metric. Patient does not answer. Leave a HIPAA compliant message on the patient's voicemail. If have not heard from patient by 09/09/17, will give her another call at that time.  Harlow Asa, PharmD, [place] [facility] [phone]

## 2017-09-07 NOTE — Patient Outreach (Signed)
[facility]) Care Management  [place]   09/07/2017  LEVANA MINETTI 04-02-1946 [id number]  Subjective: Outreach call to [name] to complete medication review for [facility] quality metric. HIPAA identifiers verified and verbal consent received.  Review with patient her medication allergies. Review with patient each of her medications including name, dose, indication and administration and update Epic accordingly. Patient reports that she uses a weekly pillbox to organize her own medications. However, reports that she does not keep the amlodipine in her pillbox. Reports that she was specifically told by her Cardiologist who started her on the amlodipine to take the amlodipine each morning first thing before other food or medication. Note that amlodipine can typically be taken once daily without regard to meals. Per [facility], amlodipine 10 mg was ordered for patient by the office on 10/16/2014 with direction "Take 1 tablet (10 mg total) by mouth once daily". Patient reports that taking the amlodipine this way has made it more difficult for her to be adherent to it. Patient denies barriers to adherence with her other medications. Let patient know that I will call to follow up with her Cardiologist regarding this specific administration direction.  Patient reports that she has been taking her ferrous gluconate for years as directed by her PCP for anemia. Patient asks about how long she will need to continue this medication. Counsel patient about the role of iron supplementation in anemia. Advise patient to follow up with her PCP at her upcoming appointment about her lab work and whether she will need to continue this supplement. Patient makes herself a note to ask Dr. Army Melia.   Patient reports that she has been taking estradiol for hot flashes for many years. Discuss with patient the risks associated with oral estrogens, including increased risk of breast  cancer and endometrial cancer. Reports that she has been on the estrogen for a long time and is interested trying stopping the medication. Advised patient to contact her PCP to further discuss the risk/benefits of continuing to take estradiol. Patient verbalizes understanding and states that she will make this call.  Reports that she takes meloxicam, on a scheduled basis, once daily as directed for arthritis pain in her knees. Counsel about risks associated with chronic use of  Nonsteroidal Anti-Inflammatory agents. Reports that she also uses Voltaren gel topically on an as needed basis. Counsel patient about using the Voltaren gel when possible in place of meloxicam to control her arthritis pain in order to reduce gastrointestinal, bleeding and renal risks. Also counsel patient to always take meloxicam with food. Patient verbalizes understanding.  Patient reports that she has not been checking her blood sugar regularly. Reports that she last took her blood sugar once in the afternoon about a month ago and the reading was in the 140s. Counsel patient about the importance of checking her own blood sugar. Patient confirms that she has a working meter. Counsel patient to restart checking her blood sugar as directed by her PCP. Counsel her to record these blood sugars in a log and to bring this with her to her medical appointments. Counsel patient about signs of low blood sugar and how to manage low blood sugar.  Patient reports that she does not check her own blood pressure and that she does not have a monitor. Patient expresses interest in obtaining a monitor and asks about ways to get assistance with this cost. Let patient know about the [facility] over the counter benefit. Provide patient  with this phone number and advise patient to call find out if she has this benefit and, if so, to use this benefit to help to purchase over the counter items, such as a blood pressure monitor. Note that patient currently uses  [facility] mail order.  Patient uses the restroom while we are on the phone and complains that her urine has been having an odd smell for a few days, "like onions". Denies any other urinary symptoms. Advise patient to call to follow up with her PCP office today to reports this issue. Patient states that she will call the office today.  Offer to have [facility] contact patient to provide education about diabetes and hypertension. Patient agrees to this referral.  Patient denies further medication questions concerns at this time. Provide patient with my phone number.  Objective:   Encounter Medications: Outpatient Encounter Medications as of 09/07/2017  Medication Sig Note  . amLODipine (NORVASC) 10 MG tablet Take 1 tablet (10 mg total) by mouth daily.   Marland Kitchen ammonium lactate (AMLACTIN) 12 % cream Apply topically as needed for dry skin.   Marland Kitchen aspirin 81 MG tablet Take 1 tablet by mouth daily. 01/14/2015: Received from: [place]: Take by mouth.  . Cholecalciferol 2000 units CAPS Take 1 capsule by mouth daily.   . diclofenac sodium (VOLTAREN) 1 % GEL Apply 2 g topically 4 (four) times daily. 09/07/2017: Uses as needed on knees for arthritis  . empagliflozin (JARDIANCE) 25 MG TABS tablet Take 25 mg by mouth daily.   Marland Kitchen estradiol (ESTRACE) 0.5 MG tablet Take 1 tablet (0.5 mg total) by mouth 3 (three) times a week.   . ezetimibe (ZETIA) 10 MG tablet Take 1 tablet (10 mg total) by mouth daily.   . Ferrous Gluconate 325 (36 FE) MG TABS Take by mouth. 09/07/2017: Taking 1 tablet by mouth once daily  . fluticasone (FLONASE) 50 MCG/ACT nasal spray Place 2 sprays into both nostrils daily.   Marland Kitchen glimepiride (AMARYL) 2 MG tablet Take 1 tablet (2 mg total) by mouth daily with breakfast.   . meloxicam (MOBIC) 15 MG tablet Take 1 tablet (15 mg total) by mouth daily.   . ramipril (ALTACE) 5 MG capsule Take 1 capsule (5 mg total) by mouth daily.   . rosuvastatin (CRESTOR) 5 MG tablet Take 1  tablet (5 mg total) by mouth daily.   . sitaGLIPtin-metformin (JANUMET) 50-1000 MG tablet Take 1 tablet by mouth 2 (two) times daily with a meal.   . triamcinolone cream (KENALOG) 0.1 % Apply 1 application topically 2 (two) times daily.   Marland Kitchen triamterene-hydrochlorothiazide (MAXZIDE-25) 37.5-25 MG tablet Take 1 tablet by mouth daily.   . Blood Glucose Calibration (ACCU-CHEK AVIVA) SOLN 1 each by In Vitro route daily as needed.   . Blood Glucose Monitoring Suppl (ACCU-CHEK AVIVA PLUS) w/Device KIT USE TWICE DAILY   . glucose blood (ACCU-CHEK AVIVA PLUS) test strip 1 each by Other route 2 (two) times daily.   Elmore Guise Devices (ACCU-CHEK SOFTCLIX) lancets 1 each by Other route 2 (two) times daily.    Facility-Administered Encounter Medications as of 09/07/2017  Medication  . cyanocobalamin ((VITAMIN B-12)) injection 1,000 mcg     Assessment:     Drugs sorted by system:   Cardiovascular: amlodipine, aspirin, Zetia, ramipril, rosuvastatin, triamterene/hydrochlorothiazide   Pulmonary/Allergy: fluticasone   Endocrine: Jardiance, glimepiride, Janumet   Topical: Amlactin cream, triamcinolone cream   Pain: diclofenac gel, meloxicam   Vitamins/Minerals: Vitamin D, Vitamin B12 injection, ferrous  gluconate   Miscellaneous: estradiol     Gaps in therapy: none noted Medications to avoid in the elderly:  . triamterene in patients taking ACE-inhibitors due to risk of hyperkalemia. Note most recent potassium level in Epic on 03/11/17 was within normal limits. Will send InBasket message to provider. . Meloxicam- patient counseled regarding risk. Will also send message to PCP. Marland Kitchen Estradiol - patient counseled regarding risks. Will also send message to PCP.  Drug interactions:  . aspirin + meloxicam: Nonsteroidal Anti-Inflammatory agents may enhance the adverse/toxic effect of aspirin. Patient counseled regarding risk. Will also send message to provider. Marland Kitchen glimepiride + Januvia + Jardiance:  Dipeptidyl Peptidase-IV Inhibitors and Sodium-Glucose Cotransporter 2 (SLGT2) Inhibitors may enhance the hypoglycemic effect of Sulfonylureas. Patient has been on these medications, not new prescriptions. Counseled patient about signs and symptoms of low blood sugar and how to manage lows. Counseled patient about the importance of restarting checking her blood sugar as directed by her PCP.  Other issues noted:  Marland Kitchen Patient not currently checking her blood sugar on a regular basis.     Plan:  1) Patient to follow up directly with her PCP office today regarding reported unusual smell of her urine. Patient to also talk to her doctor regarding her question about how long to continue her ferrous gluconate supplement. 2) Patient to discuss with her PCP the risk/benefits of continuing to take estradiol. 3) Patient to contact [employer] OTC benefit about ordering blood pressure monitor. 4) Patient to start checking her blood sugar as directed by her PCP. 4) Will call to follow up with patient's Cardiologist regarding patient's report of provider instruction for administration of her amlodipine. 5) Will send a referral to nursing to have RNCM follow up with patient for diabetes and hypertension education. 6) Will send this note along with a letter to patient's PCP regarding medications to avoid in the elderly, including triamterene, meloxicam and estradiol.   Harlow Asa, PharmD, [place] Management [phone]

## 2017-09-09 ENCOUNTER — Other Ambulatory Visit: Payer: Self-pay | Admitting: Pharmacist

## 2017-09-09 NOTE — Patient Outreach (Signed)
Call to follow up with patient's Cardiologist regarding patient's report of provider instruction for amlodipine administration. Leave a message letting provider's nurse know that patient states that she was specifically told by her cardiologist to take the amlodipine each morning first thing before other food or medication. Note that amlodipine can typically be taken once daily without regard to meals. Per [facility], amlodipine 10 mg was ordered for patient by the office on 10/16/2014 with direction "Take 1 tablet (10 mg total) by mouth once daily".   Receive a call back. Per nurse of Dr. Laurance Flatten, patient to take amlodipine once daily without regard to meals, just at the same time each day.  Will call to follow up with patient about this clarification and her amlodipine adherence.  Harlow Asa, PharmD, [place] [facility] [phone]

## 2017-09-09 NOTE — Patient Outreach (Signed)
[facility] [facility]) Care Management  09/09/2017  Sheila Sandoval [birth year] [id number]   Call to follow up with [name] for medication managment. Left a HIPAA compliant message on the patient's voicemail. If have not heard from patient by next week, will give her another call at that time.  Harlow Asa, PharmD, [place] [facility] [phone]

## 2017-09-09 NOTE — Addendum Note (Signed)
Addended by: Harlow Asa A on: 09/09/2017 03:26 PM   Modules accepted: Orders

## 2017-09-12 ENCOUNTER — Other Ambulatory Visit: Payer: Self-pay | Admitting: Pharmacist

## 2017-09-12 NOTE — Patient Outreach (Signed)
[place] [facility]) Care Management  09/12/2017  Sheila Sandoval 03-Jan-1946 [id number]   Call to follow up with [name] for medication managment. Outreach attempt #2. Receive a message that patient now has a voicemail that has not been setup yet. Will send patient a letter in an attempt to outreach. If I have not heard back from [name] in 10 days, will call to follow up again at that time.   Harlow Asa, PharmD, [place] [facility] [phone]

## 2017-09-16 ENCOUNTER — Encounter: Payer: Self-pay | Admitting: *Deleted

## 2017-09-16 ENCOUNTER — Other Ambulatory Visit: Payer: Self-pay | Admitting: *Deleted

## 2017-09-16 NOTE — Patient Outreach (Signed)
[facility] [facility]) Care Management  09/16/2017  Sheila Sandoval August 21, 1945 [id number]   RN Health Coach attempted #1  outreach call to patient.  Patient was unavailable. Per voice mail message the voice mail has not been set up.  Plan: RN will call patient again within 10 business days  [place] Management [phone]

## 2017-09-19 ENCOUNTER — Ambulatory Visit: Payer: Self-pay | Admitting: *Deleted

## 2017-09-23 ENCOUNTER — Other Ambulatory Visit: Payer: Self-pay | Admitting: *Deleted

## 2017-09-23 NOTE — Patient Outreach (Signed)
[facility]) Care Management  09/23/2017  Sheila Sandoval 1945-08-31 [id number]   RN Health Coach attempted #2 follow up outreach call to patient.  Patient was unavailable. HIPPA compliance voicemail message left with return callback number.  Plan: RN sent unsuccessful outreach letter RN will call patient again within 10 business days  [place] Management [phone]

## 2017-09-26 ENCOUNTER — Other Ambulatory Visit: Payer: Self-pay | Admitting: Pharmacist

## 2017-09-26 NOTE — Patient Outreach (Signed)
[place] [facility]) Care Management  09/26/2017  MARLAYNA BANNISTER [birth year] [id number]   Call to follow up with [name] for medication managment. Outreach attempt #3. Left a HIPAA compliant message on the patient's voicemail. Have made three phone call attempts and sent letter. No response within 10 business days. Will now close pharmacy episode.   Harlow Asa, PharmD, [place] [facility] [phone]

## 2017-10-03 ENCOUNTER — Ambulatory Visit: Payer: Medicare PPO

## 2017-10-04 ENCOUNTER — Other Ambulatory Visit: Payer: Self-pay | Admitting: Internal Medicine

## 2017-10-04 DIAGNOSIS — E782 Mixed hyperlipidemia: Secondary | ICD-10-CM

## 2017-10-05 ENCOUNTER — Other Ambulatory Visit: Payer: Self-pay | Admitting: Internal Medicine

## 2017-10-06 ENCOUNTER — Ambulatory Visit: Payer: Self-pay | Admitting: *Deleted

## 2017-10-07 ENCOUNTER — Ambulatory Visit: Payer: Self-pay | Admitting: *Deleted

## 2017-10-07 ENCOUNTER — Other Ambulatory Visit: Payer: Self-pay | Admitting: Pharmacist

## 2017-10-07 NOTE — Patient Outreach (Signed)
[place] [facility]) Care Management  10/07/2017  DANIESHA DRIVER Jun 28, 1946 [id number]   Receive a message from [place] letting me know that DEION FORGUE called in requesting a call back from me in response to the outreach letter that I sent her.  Called and spoke with patient. HIPAA identifiers verified and verbal consent received. Let Ms. Vahey that I had been calling to follow up with her regarding her medication management and that I had sent her a letter when I was unable to reach her. Let patient know that per nurse of her Cardiologist, Dr. Laurance Flatten, patient to take amlodipine once daily without regard to meals, just at the same time each day. Ms. Julson expresses appreciation for this information.  Also let Ms. Sheeran know that [employer] had also been trying to reach out to her. Provide Ms. Winona Legato with Clearence Ped phone number.  Patient denies any further medication questions/concerns at this time. Confirm that patient has my phone number.  Harlow Asa, PharmD, [place] [facility] [phone]

## 2017-10-11 ENCOUNTER — Telehealth: Payer: Self-pay

## 2017-10-11 NOTE — Telephone Encounter (Signed)
Patient called stating she seen on TV last night that women over 70 should not be taking aspirin daily. Should she continue aspirin or stop taking it?  Please Advise.

## 2017-10-12 NOTE — Telephone Encounter (Signed)
She should keep taking it.

## 2017-10-12 NOTE — Telephone Encounter (Signed)
Patient informed to continue taking aspirin.

## 2017-10-20 ENCOUNTER — Telehealth: Payer: Self-pay

## 2017-10-20 NOTE — Telephone Encounter (Signed)
Called pt to reschedule AWV. Currently has an AWV scheduled on 10/26/17. Last AWV performed on 11/05/16. AWV will need to be rescheduled AFTER 11/05/17. LVM requesting returned call.

## 2017-10-24 ENCOUNTER — Ambulatory Visit: Payer: Medicare PPO

## 2017-10-26 ENCOUNTER — Ambulatory Visit: Payer: Medicare PPO | Admitting: Internal Medicine

## 2017-10-26 ENCOUNTER — Encounter: Payer: Self-pay | Admitting: Internal Medicine

## 2017-10-26 ENCOUNTER — Other Ambulatory Visit: Payer: Self-pay | Admitting: Internal Medicine

## 2017-10-26 ENCOUNTER — Ambulatory Visit: Payer: Medicare PPO

## 2017-10-26 VITALS — BP 130/68 | HR 75 | Ht 61.5 in | Wt 171.0 lb

## 2017-10-26 DIAGNOSIS — K219 Gastro-esophageal reflux disease without esophagitis: Secondary | ICD-10-CM | POA: Diagnosis not present

## 2017-10-26 DIAGNOSIS — G5603 Carpal tunnel syndrome, bilateral upper limbs: Secondary | ICD-10-CM

## 2017-10-26 DIAGNOSIS — E785 Hyperlipidemia, unspecified: Secondary | ICD-10-CM | POA: Diagnosis not present

## 2017-10-26 DIAGNOSIS — E118 Type 2 diabetes mellitus with unspecified complications: Secondary | ICD-10-CM | POA: Diagnosis not present

## 2017-10-26 DIAGNOSIS — D510 Vitamin B12 deficiency anemia due to intrinsic factor deficiency: Secondary | ICD-10-CM | POA: Diagnosis not present

## 2017-10-26 DIAGNOSIS — I10 Essential (primary) hypertension: Secondary | ICD-10-CM | POA: Diagnosis not present

## 2017-10-26 DIAGNOSIS — Z1231 Encounter for screening mammogram for malignant neoplasm of breast: Secondary | ICD-10-CM | POA: Diagnosis not present

## 2017-10-26 DIAGNOSIS — E1169 Type 2 diabetes mellitus with other specified complication: Secondary | ICD-10-CM

## 2017-10-26 DIAGNOSIS — Z0000 Encounter for general adult medical examination without abnormal findings: Secondary | ICD-10-CM | POA: Diagnosis not present

## 2017-10-26 LAB — POCT URINALYSIS DIPSTICK
Bilirubin, UA: NEGATIVE
Glucose, UA: NEGATIVE
Ketones, UA: NEGATIVE
LEUKOCYTES UA: NEGATIVE
Nitrite, UA: NEGATIVE
PH UA: 6 (ref 5.0–8.0)
PROTEIN UA: NEGATIVE
RBC UA: NEGATIVE
SPEC GRAV UA: 1.015 (ref 1.010–1.025)
Urobilinogen, UA: 0.2 E.U./dL

## 2017-10-26 MED ORDER — CYANOCOBALAMIN 1000 MCG/ML IJ SOLN
1000.0000 ug | Freq: Once | INTRAMUSCULAR | Status: AC
  Administered 2017-10-26: 1000 ug via INTRAMUSCULAR

## 2017-10-26 NOTE — Patient Instructions (Signed)

## 2017-10-26 NOTE — Progress Notes (Signed)
Date:  10/26/2017   Name:  Sheila Sandoval   DOB:  04/19/1946   MRN:  [id number]   Chief Complaint: Annual Exam (Breast Exam. ) Sheila Sandoval is a 72 y.o. female who presents today for her Complete Annual Exam. She feels well. She reports exercising regularly. She reports she is sleeping well.  She is having CTS sx but they are controlled with splints. She has some arm pain as well that may be referred from her wrists. Mammogram was normal in November 2018, she denies breast problems. She is getting the first of the Shingrix vaccines today.  Diabetes  She presents for her follow-up diabetic visit. She has type 2 diabetes mellitus. Her disease course has been stable. Pertinent negatives for hypoglycemia include no dizziness, headaches, nervousness/anxiousness or tremors. Pertinent negatives for diabetes include no chest pain, no fatigue, no polydipsia and no polyuria. Pertinent negatives for diabetic complications include no PVD. Current diabetic treatments: glimepiride, jardiance, janumet. She is compliant with treatment all of the time. Her weight is stable. She is following a generally healthy diet. She participates in exercise three times a week. She monitors blood glucose at home 1-2 x per day. Her breakfast blood glucose is taken between 7-8 am. Her breakfast blood glucose range is generally 140-180 mg/dl. Her dinner blood glucose is taken between 6-7 pm. Her dinner blood glucose range is generally 130-140 mg/dl. An ACE inhibitor/angiotensin II receptor blocker is being taken. Eye exam is current.  Hypertension  This is a chronic problem. The problem is controlled. Pertinent negatives include no chest pain, headaches, palpitations or shortness of breath. Past treatments include ACE inhibitors. There is no history of CAD/MI or PVD.  Gastroesophageal Reflux  She complains of heartburn. She reports no abdominal pain, no chest pain, no coughing or no wheezing. This is a recurrent  problem. The problem occurs occasionally. Pertinent negatives include no fatigue. She has tried a PPI for the symptoms.  Hyperlipidemia  This is a chronic problem. The problem is controlled. Pertinent negatives include no chest pain or shortness of breath. Current antihyperlipidemic treatment includes statins. The current treatment provides significant improvement of lipids.   Lab Results  Component Value Date   HGBA1C 7.9 (H) 07/15/2017      Review of Systems  Constitutional: Negative for chills, fatigue and fever.  HENT: Negative for congestion, hearing loss, tinnitus, trouble swallowing and voice change.   Eyes: Negative for visual disturbance.  Respiratory: Negative for cough, chest tightness, shortness of breath and wheezing.   Cardiovascular: Negative for chest pain, palpitations and leg swelling.  Gastrointestinal: Positive for heartburn. Negative for abdominal pain, constipation, diarrhea and vomiting.  Endocrine: Negative for polydipsia and polyuria.  Genitourinary: Negative for dysuria, frequency, genital sores, hematuria, vaginal bleeding and vaginal discharge.  Musculoskeletal: Positive for arthralgias (intermittent shoulder pain). Negative for gait problem and joint swelling.  Skin: Negative for color change and rash.  Neurological: Negative for dizziness, tremors, light-headedness and headaches.  Hematological: Negative for adenopathy. Does not bruise/bleed easily.  Psychiatric/Behavioral: Negative for dysphoric mood and sleep disturbance. The patient is not nervous/anxious.     Patient Active Problem List   Diagnosis Date Noted  . Anatomical narrow angle glaucoma with borderline intraocular pressure 07/15/2017  . Vitamin D deficiency 03/11/2017  . Hypercalcemia 10/02/2016  . Diabetes mellitus type 2, controlled, with complications ([place]) 24/82/5003  . Pernicious anemia 04/11/2015  . Carpal tunnel syndrome 01/14/2015  . Hyperlipidemia associated with type 2 diabetes  mellitus ([place])  01/14/2015  . Essential hypertension 01/14/2015  . Climacteric 01/14/2015  . Muscle fatigue 01/14/2015  . Arthritis of hand, degenerative 01/14/2015  . GERD (gastroesophageal reflux disease) 10/29/2011  . Spondylolisthesis 10/29/2011    Prior to Admission medications   Medication Sig Start Date End Date Taking? Authorizing Provider  amLODipine (NORVASC) 10 MG tablet TAKE 1 TABLET EVERY DAY 10/05/17   Glean Hess, MD  ammonium lactate (AMLACTIN) 12 % cream Apply topically as needed for dry skin.    [provider]  aspirin 81 MG tablet Take 1 tablet by mouth daily.    [provider]  Blood Glucose Calibration (ACCU-CHEK AVIVA) SOLN 1 each by In Vitro route daily as needed. 10/20/16   Glean Hess, MD  Blood Glucose Monitoring Suppl (ACCU-CHEK AVIVA PLUS) w/Device KIT USE TWICE DAILY 12/28/16   Glean Hess, MD  Cholecalciferol 2000 units CAPS Take 1 capsule by mouth daily.    [provider]  diclofenac sodium (VOLTAREN) 1 % GEL Apply 2 g topically 4 (four) times daily. 03/23/17   Glean Hess, MD  estradiol (ESTRACE) 0.5 MG tablet Take 1 tablet (0.5 mg total) by mouth 3 (three) times a week. 11/03/16   Glean Hess, MD  ezetimibe (ZETIA) 10 MG tablet TAKE 1 TABLET EVERY DAY 10/05/17   Glean Hess, MD  Ferrous Gluconate 325 (36 FE) MG TABS Take by mouth.    [provider]  fluticasone (FLONASE) 50 MCG/ACT nasal spray Place 2 sprays into both nostrils daily. 10/20/16   Glean Hess, MD  glimepiride (AMARYL) 2 MG tablet Take 1 tablet (2 mg total) by mouth daily with breakfast. 10/19/16   Glean Hess, MD  glucose blood (ACCU-CHEK AVIVA PLUS) test strip 1 each by Other route 2 (two) times daily. 10/20/16   Glean Hess, MD  JARDIANCE 25 MG TABS tablet TAKE 1 TABLET EVERY DAY 10/05/17   Glean Hess, MD  Lancet Devices [facility]) lancets 1 each by Other route 2 (two) times daily. 10/20/16    Glean Hess, MD  meloxicam [facility]) 15 MG tablet TAKE 1 TABLET EVERY DAY 10/05/17   Glean Hess, MD  ramipril (ALTACE) 5 MG capsule TAKE 1 CAPSULE EVERY DAY 10/05/17   Glean Hess, MD  rosuvastatin (CRESTOR) 5 MG tablet TAKE 1 TABLET EVERY DAY 10/05/17   Glean Hess, MD  sitaGLIPtin-metformin (JANUMET) 50-1000 MG tablet Take 1 tablet by mouth 2 (two) times daily with a meal. 10/19/16   Glean Hess, MD  triamcinolone cream (KENALOG) 0.1 % Apply 1 application topically 2 (two) times daily.    [provider]  triamterene-hydrochlorothiazide (MAXZIDE-25) 37.5-25 MG tablet TAKE 1 TABLET EVERY DAY 10/05/17   Glean Hess, MD    Allergies  Allergen Reactions  . Atorvastatin     chest tightness  . Tramadol Hcl Nausea And Vomiting    Past Surgical History:  Procedure Laterality Date  . ABDOMINAL HYSTERECTOMY  1981  . APPENDECTOMY  1981  . REPAIR DURAL / CSF LEAK  2002    Social History   Tobacco Use  . Smoking status: Never Smoker  . Smokeless tobacco: Never Used  Substance Use Topics  . Alcohol use: No    Alcohol/week: 0.0 oz  . Drug use: No     Medication list has been reviewed and updated.  PHQ 2/9 Scores 11/05/2016 10/23/2015 04/11/2015  PHQ - 2 Score 0 0 0    Physical  Exam  Constitutional: She is oriented to person, place, and time. She appears well-developed and well-nourished. No distress.  HENT:  Head: Normocephalic and atraumatic.  Right Ear: Tympanic membrane and ear canal normal.  Left Ear: Tympanic membrane and ear canal normal.  Nose: Right sinus exhibits no maxillary sinus tenderness. Left sinus exhibits no maxillary sinus tenderness.  Mouth/Throat: Uvula is midline and oropharynx is clear and moist.  Eyes: Conjunctivae and EOM are normal. Right eye exhibits no discharge. Left eye exhibits no discharge. No scleral icterus.  Neck: Normal range of motion. Carotid bruit is not present. No erythema present. No thyromegaly present.   Cardiovascular: Normal rate, regular rhythm, normal heart sounds and normal pulses.  Pulmonary/Chest: Effort normal. No respiratory distress. She has no wheezes. Right breast exhibits no mass, no nipple discharge, no skin change and no tenderness. Left breast exhibits no mass, no nipple discharge, no skin change and no tenderness.  Abdominal: Soft. Bowel sounds are normal. There is no hepatosplenomegaly. There is no tenderness. There is no CVA tenderness.  Musculoskeletal: Normal range of motion. She exhibits no edema or tenderness.  Lymphadenopathy:    She has no cervical adenopathy.    She has no axillary adenopathy.  Neurological: She is alert and oriented to person, place, and time. She has normal reflexes. No cranial nerve deficit or sensory deficit.  Skin: Skin is warm, dry and intact. No rash noted.  Psychiatric: She has a normal mood and affect. Her speech is normal and behavior is normal. Thought content normal.  Nursing note and vitals reviewed.   BP 130/68   Pulse 75   Ht 5' 1.5" (1.562 m)   Wt 171 lb (77.6 kg)   SpO2 100%   BMI 31.79 kg/m   Assessment and Plan: 1. Annual physical exam HM up to date - POCT urinalysis dipstick  2. Encounter for screening mammogram for breast cancer Will be due in November  3. Essential hypertension controlled - CBC with Differential/Platelet  4. Controlled type 2 diabetes mellitus with complication, without long-term current use of insulin (HCC) Controlled Continue meds, diet and exercise - Comprehensive metabolic panel - Hemoglobin A1c - TSH  5. Hyperlipidemia associated with type 2 diabetes mellitus ([place]) On statin therapy - Lipid panel  6. Gastroesophageal reflux disease, esophagitis presence not specified Controlled on PPI  7. Pernicious anemia - cyanocobalamin ((VITAMIN B-12)) injection 1,000 mcg  8. Bilateral carpal tunnel syndrome Continue wrist splints while sleeping   Meds ordered this encounter  Medications   . cyanocobalamin ((VITAMIN B-12)) injection 1,000 mcg    Partially dictated using Editor, commissioning. Any errors are unintentional.  Halina Maidens, MD [facility]  10/26/2017

## 2017-10-27 LAB — COMPREHENSIVE METABOLIC PANEL
A/G RATIO: 2.1 (ref 1.2–2.2)
ALBUMIN: 4.6 g/dL (ref 3.5–4.8)
ALT: 16 IU/L (ref 0–32)
AST: 15 IU/L (ref 0–40)
Alkaline Phosphatase: 73 IU/L (ref 39–117)
BUN/Creatinine Ratio: 19 (ref 12–28)
BUN: 20 mg/dL (ref 8–27)
Bilirubin Total: 0.5 mg/dL (ref 0.0–1.2)
CO2: 22 mmol/L (ref 20–29)
Calcium: 10.2 mg/dL (ref 8.7–10.3)
Chloride: 103 mmol/L (ref 96–106)
Creatinine, Ser: 1.07 mg/dL — ABNORMAL HIGH (ref 0.57–1.00)
GFR calc non Af Amer: 52 mL/min/{1.73_m2} — ABNORMAL LOW (ref >59)
GFR, EST AFRICAN AMERICAN: 60 mL/min/{1.73_m2} (ref >59)
GLUCOSE: 119 mg/dL — AB (ref 65–99)
Globulin, Total: 2.2 g/dL (ref 1.5–4.5)
Potassium: 4.5 mmol/L (ref 3.5–5.2)
Sodium: 144 mmol/L (ref 134–144)
TOTAL PROTEIN: 6.8 g/dL (ref 6.0–8.5)

## 2017-10-27 LAB — CBC WITH DIFFERENTIAL/PLATELET
BASOS: 1 %
Basophils Absolute: 0 10*3/uL (ref 0.0–0.2)
EOS (ABSOLUTE): 0.1 10*3/uL (ref 0.0–0.4)
Eos: 2 %
HEMOGLOBIN: 12.4 g/dL (ref 11.1–15.9)
Hematocrit: 38.8 % (ref 34.0–46.6)
IMMATURE GRANS (ABS): 0 10*3/uL (ref 0.0–0.1)
IMMATURE GRANULOCYTES: 0 %
Lymphocytes Absolute: 2.4 10*3/uL (ref 0.7–3.1)
Lymphs: 39 %
MCH: 22.3 pg — ABNORMAL LOW (ref 26.6–33.0)
MCHC: 32 g/dL (ref 31.5–35.7)
MCV: 70 fL — AB (ref 79–97)
Monocytes Absolute: 0.4 10*3/uL (ref 0.1–0.9)
Monocytes: 6 %
NEUTROS PCT: 52 %
Neutrophils Absolute: 3.2 10*3/uL (ref 1.4–7.0)
PLATELETS: 276 10*3/uL (ref 150–379)
RBC: 5.55 x10E6/uL — ABNORMAL HIGH (ref 3.77–5.28)
RDW: 15.5 % — ABNORMAL HIGH (ref 12.3–15.4)
WBC: 6.1 10*3/uL (ref 3.4–10.8)

## 2017-10-27 LAB — LIPID PANEL
CHOLESTEROL TOTAL: 125 mg/dL (ref 100–199)
Chol/HDL Ratio: 2.4 ratio (ref 0.0–4.4)
HDL: 53 mg/dL (ref >39)
LDL CALC: 60 mg/dL (ref 0–99)
TRIGLYCERIDES: 61 mg/dL (ref 0–149)
VLDL CHOLESTEROL CAL: 12 mg/dL (ref 5–40)

## 2017-10-27 LAB — TSH: TSH: 1.22 u[IU]/mL (ref 0.450–4.500)

## 2017-10-27 LAB — HEMOGLOBIN A1C
Est. average glucose Bld gHb Est-mCnc: 183 mg/dL
Hgb A1c MFr Bld: 8 % — ABNORMAL HIGH (ref 4.8–5.6)

## 2017-10-28 ENCOUNTER — Telehealth: Payer: Self-pay | Admitting: Internal Medicine

## 2017-10-28 ENCOUNTER — Encounter: Payer: Medicare PPO | Admitting: Internal Medicine

## 2017-10-28 NOTE — Telephone Encounter (Signed)
resched awv Ammie not at [facility] on Thursdays

## 2017-10-31 NOTE — Telephone Encounter (Signed)
Appt rescheduled to 11/30/17 due to program no longer available on Thursdays.

## 2017-11-30 ENCOUNTER — Ambulatory Visit (INDEPENDENT_AMBULATORY_CARE_PROVIDER_SITE_OTHER): Payer: Medicare PPO

## 2017-11-30 VITALS — BP 120/60 | HR 62 | Temp 97.9°F | Resp 12 | Ht 62.0 in | Wt 171.8 lb

## 2017-11-30 DIAGNOSIS — E538 Deficiency of other specified B group vitamins: Secondary | ICD-10-CM | POA: Diagnosis not present

## 2017-11-30 DIAGNOSIS — Z0000 Encounter for general adult medical examination without abnormal findings: Secondary | ICD-10-CM

## 2017-11-30 DIAGNOSIS — E119 Type 2 diabetes mellitus without complications: Secondary | ICD-10-CM

## 2017-11-30 DIAGNOSIS — Z599 Problem related to housing and economic circumstances, unspecified: Secondary | ICD-10-CM

## 2017-11-30 DIAGNOSIS — Z9181 History of falling: Secondary | ICD-10-CM | POA: Diagnosis not present

## 2017-11-30 DIAGNOSIS — Z598 Other problems related to housing and economic circumstances: Secondary | ICD-10-CM | POA: Diagnosis not present

## 2017-11-30 MED ORDER — CYANOCOBALAMIN 1000 MCG/ML IJ SOLN
1000.0000 ug | Freq: Once | INTRAMUSCULAR | Status: AC
  Administered 2017-11-30: 1000 ug via INTRAMUSCULAR

## 2017-11-30 NOTE — Progress Notes (Addendum)
Subjective:   Sheila Sandoval is a 72 y.o. female who presents for Medicare Annual (Subsequent) preventive examination.  Review of Systems:   N/A Cardiac Risk Factors include: advanced age (>16mn, >>82women);diabetes mellitus;dyslipidemia;hypertension;obesity (BMI >30kg/m2);sedentary lifestyle     Objective:     Vitals: BP 120/60 (BP Location: Right Arm, Patient Position: Sitting, Cuff Size: Normal)   Pulse 62   Temp 97.9 F (36.6 C) (Oral)   Resp 12   Ht _0  (1.575 m)   Wt 171 lb 12.8 oz (77.9 kg)   SpO2 96%   BMI 31.42 kg/m   Body mass index is 31.42 kg/m.  Advanced Directives 11/30/2017 11/05/2016 11/05/2016 10/23/2015  Does Patient Have a Medical Advance Directive? No No No No  Would patient like information on creating a medical advance directive? Yes (MAU/Ambulatory/Procedural Areas - Information given) - - No - patient declined information    Tobacco Social History   Tobacco Use  Smoking Status Never Smoker  Smokeless Tobacco Never Used  Tobacco Comment   smoking cessation material not required     Counseling given: No Comment: smoking cessation material not required  Clinical Intake:  Pre-visit preparation completed: Yes  Pain : No/denies pain   BMI - recorded: 31.42 Nutritional Status: BMI > 30  Obese Nutrition Risk Assessment: Has the patient had any N/V/D within the last 2 months?  No Does the patient have any non-healing wounds?  No Has the patient had any unintentional weight loss or weight gain?  No  Is the patient diabetic?  Yes If diabetic, was a CBG obtained today?  No Did the patient bring in their glucometer from home?  No Comments: Pt monitors CBG's daily. Denies any financial strains with the device or supplies. Pt has recently lost her disability benefits and in unable to afford food. Referral to [facility] placed to assist with cost of food.  Diabetic Exams: Diabetic Eye Exam: Completed 05/27/17. Pt was provided with a new eyeglass Rx.  At this time, pt states she is unable to afford the cost of new glasses. Referral placed to [facility] to help off set cost of Rx glasses. Diabetic Foot Exam: Completed 10/26/17  How often do you need to have someone help you when you read instructions, pamphlets, or other written materials from your doctor or pharmacy?: 1 - Never  Interpreter Needed?: No  Information entered by :: AIdell Pickles LPN  Past Medical History:  Diagnosis Date  . B12 deficiency   . [place]   . GERD (gastroesophageal reflux disease)   . Hypercalcemia   . Hypertension   . Vitamin D deficiency    Past Surgical History:  Procedure Laterality Date  . ABDOMINAL HYSTERECTOMY  1981  . APPENDECTOMY  1981  . REPAIR DURAL / CSF LEAK  2002   Family History  Problem Relation Age of Onset  . Diabetes Mother   . Cancer Father        prostate  . Breast cancer Sister   . Cancer Brother        prostate  . Other Sister        fall   Social History   Socioeconomic History  . Marital status: Widowed    Spouse name: Not on file  . Number of children: 3  . Years of education: Not on file  . Highest education level: 11th grade  Occupational History  . Occupation: Retired  SScientific laboratory technician . Financial resource strain: Very hard  .  Food insecurity:    Worry: Often true    Inability: Often true  . Transportation needs:    Medical: No    Non-medical: No  Tobacco Use  . Smoking status: Never Smoker  . Smokeless tobacco: Never Used  . Tobacco comment: smoking cessation material not required  Substance and Sexual Activity  . Alcohol use: No    Alcohol/week: 0.0 oz  . Drug use: No  . Sexual activity: Not Currently  Lifestyle  . Physical activity:    Days per week: 2 days    Minutes per session: 30 min  . Stress: Only a little  Relationships  . Social connections:    Talks on phone: Patient refused    Gets together: Patient refused    Attends religious service: Patient refused     Active member of club or organization: Patient refused    Attends meetings of clubs or organizations: Patient refused    Relationship status: Widowed  Other Topics Concern  . Not on file  Social History Narrative  . Not on file    Outpatient Encounter Medications as of 11/30/2017  Medication Sig  . amLODipine (NORVASC) 10 MG tablet TAKE 1 TABLET EVERY DAY  . ammonium lactate (AMLACTIN) 12 % cream Apply topically as needed for dry skin.  Marland Kitchen aspirin 81 MG tablet Take 1 tablet by mouth daily.  . Blood Glucose Calibration (ACCU-CHEK AVIVA) SOLN 1 each by In Vitro route daily as needed.  . Blood Glucose Monitoring Suppl (ACCU-CHEK AVIVA PLUS) w/Device KIT USE TWICE DAILY  . Cholecalciferol 2000 units CAPS Take 1 capsule by mouth daily.  . diclofenac sodium (VOLTAREN) 1 % GEL Apply 2 g topically 4 (four) times daily.  Marland Kitchen estradiol (ESTRACE) 0.5 MG tablet TAKE 1 TABLET BY MOUTH 3 TIMES A WEEK  . ezetimibe (ZETIA) 10 MG tablet TAKE 1 TABLET EVERY DAY  . Ferrous Gluconate 325 (36 FE) MG TABS Take by mouth.  . fluticasone (FLONASE) 50 MCG/ACT nasal spray Place 2 sprays into both nostrils daily.  Marland Kitchen glimepiride (AMARYL) 2 MG tablet Take 1 tablet (2 mg total) by mouth daily with breakfast.  . glucose blood (ACCU-CHEK AVIVA PLUS) test strip 1 each by Other route 2 (two) times daily.  Marland Kitchen JARDIANCE 25 MG TABS tablet TAKE 1 TABLET EVERY DAY  . Lancet Devices (ACCU-CHEK SOFTCLIX) lancets 1 each by Other route 2 (two) times daily.  . meloxicam (MOBIC) 15 MG tablet TAKE 1 TABLET EVERY DAY  . ramipril (ALTACE) 5 MG capsule TAKE 1 CAPSULE EVERY DAY  . rosuvastatin (CRESTOR) 5 MG tablet TAKE 1 TABLET EVERY DAY  . sitaGLIPtin-metformin (JANUMET) 50-1000 MG tablet Take 1 tablet by mouth 2 (two) times daily with a meal.  . triamcinolone cream (KENALOG) 0.1 % Apply 1 application topically 2 (two) times daily.  Marland Kitchen triamterene-hydrochlorothiazide (MAXZIDE-25) 37.5-25 MG tablet TAKE 1 TABLET EVERY DAY    Facility-Administered Encounter Medications as of 11/30/2017  Medication  . cyanocobalamin ((VITAMIN B-12)) injection 1,000 mcg    Activities of Daily Living In your present state of health, do you have any difficulty performing the following activities: 11/30/2017  Hearing? N  Comment denies hearing aids  Vision? N  Comment has a Rx for eyeglasses but unable to afford them. Referral sent to [facility]; cataracts  Difficulty concentrating or making decisions? N  Walking or climbing stairs? Y  Comment fatigue  Dressing or bathing? N  Doing errands, shopping? N  Preparing Food and eating ? N  Comment full  set upper and lower dentures  Using the Toilet? N  In the past six months, have you accidently leaked urine? N  Do you have problems with loss of bowel control? N  Managing your Medications? N  Managing your Finances? N  Housekeeping or managing your Housekeeping? N  Some recent data might be hidden    Patient Care Team: Glean Hess, MD as PCP - General (Internal Medicine) Sherie Don, MD as Referring Physician (Ophthalmology)    Assessment:   This is a routine wellness examination for Sheala.  Exercise Activities and Dietary recommendations Current Exercise Habits: Home exercise routine, Type of exercise: strength training/weights, Time (Minutes): 30, Frequency (Times/Week): 2, Weekly Exercise (Minutes/Week): 60, Intensity: Mild, Exercise limited by: None identified  Goals    . DIET - INCREASE WATER INTAKE     Recommend to drink at least 6-8 8oz glasses of water per day.    . Prevent falls     Recommend to remove any items from the home that may cause slips or trips. Also recommend to add a non-slid pad to her shower to reduce risk for fall.        Fall Risk Fall Risk  11/30/2017 11/05/2016 10/23/2015 04/11/2015  Falls in the past year? Yes No No No  Comment fell getting in and out of the shower. Struck head each time. No fall risk prevention in place at this  time. Education provide and referral placed - - -  Number falls in past yr: 2 or more - - -  Injury with Fall? Yes - - -  Risk Factor Category  High Fall Risk - - -  Comment all falls occured in shower. Pt denies having anything in her shower to reduce risk for falls. Referral to [facility] placed - - -  Risk for fall due to : History of fall(s);Other (Comment) - - -  Risk for fall due to: Comment no safety precautions in place at home to reduce her risk for fall - - -  Follow up Falls evaluation completed;Education provided;Falls prevention discussed - - -   FALL RISK PREVENTION PERTAINING TO HOME: Is your home free of loose throw rugs in walkways, pet beds, electrical cords, etc? Yes Is there adequate lighting in your home to reduce risk of falls?  Yes Are there stairs in or around your home WITH handrails? Yes  ASSISTIVE DEVICES UTILIZED TO PREVENT FALLS: Use of a cane, walker or w/c? No Grab bars in the bathroom? No. [place] getting in and out of the shower. Struck head each time. Recommended pt place non-slip pad in shower to reduce falls until grab bars can be installed. Referral placed to [facility] for assistance with installation of grab bars. Shower chair or a place to sit while bathing? No. Given h/o falls, may benefit from a bench, shower chair or walk in shower. Referral placed to [facility] for further evaluation of available resources. An elevated toilet seat or a handicapped toilet? Yes  Timed Get Up and Go Performed: Yes. Pt ambulated 10 feet within 10 sec. Gait stead-fast and without the use of an assistive device. Fall risk prevention has been discussed.  Community Resource Referral:  Data processing manager Referral sent to [employer] for installation of grab bars in the shower and a shower chair/bench.  Depression Screen PHQ 2/9 Scores 11/30/2017 11/05/2016 10/23/2015 04/11/2015  PHQ - 2 Score 2 0 0 0  PHQ- 9 Score 4 - - -     Cognitive Function  6CIT Screen 11/30/2017 11/05/2016  What Year? 0  points 0 points  What month? 0 points 0 points  What time? 0 points 0 points  Count back from 20 0 points 0 points  Months in reverse 4 points 0 points  Repeat phrase 4 points 0 points  Total Score 8 0    Immunization History  Administered Date(s) Administered  . Influenza,inj,Quad PF,6+ Mos 07/25/2013, 04/11/2015, 06/04/2016, 08/09/2016, 05/04/2017  . Influenza-Unspecified 08/20/2014  . Pneumococcal Conjugate-13 08/02/2014  . Pneumococcal Polysaccharide-23 09/04/2010, 09/17/2016  . Pneumococcal-Unspecified 07/26/2000, 09/04/2010  . Tdap 09/04/2010  . Zoster Recombinat (Shingrix) 10/26/2017    Qualifies for Shingles Vaccine? Yes. Completed 1st dose of Shingrix on 10/26/17. Due for 2nd dose of Shingrix. Pt has been advised to provide a copy of her vaccination record once series is completed. Verbalized acceptance and understanding.  Screening Tests Health Maintenance  Topic Date Due  . INFLUENZA VACCINE  02/23/2018  . HEMOGLOBIN A1C  04/27/2018  . MAMMOGRAM  05/27/2018  . OPHTHALMOLOGY EXAM  05/27/2018  . FOOT EXAM  10/27/2018  . TETANUS/TDAP  09/04/2020  . COLONOSCOPY  09/08/2025  . DEXA SCAN  Completed  . Hepatitis C Screening  Completed  . PNA vac Low Risk Adult  Completed    Cancer Screenings: Lung: Low Dose CT Chest recommended if Age 61-80 years, 30 pack-year currently smoking OR have quit w/in 15years. Patient does not qualify. Breast:  Up to date on Mammogram? Yes. Completed 05/27/17. Repeat every year   Up to date of Bone Density/Dexa? Yes. Completed 05/27/17. Osteoporotic screening no longer required Colorectal: Completed 09/09/15. Repeat every 10 years.  Additional Screenings: Hepatitis C Screening: Completed 11/05/16    Plan:  I have personally reviewed and addressed the Medicare Annual Wellness questionnaire and have noted the following in the patient's chart:  A. Medical and social history B. Use of alcohol, tobacco or illicit drugs  C. Current medications and  supplements D. Functional ability and status E.  Nutritional status F.  Physical activity G. Advance directives H. List of other physicians I.  Hospitalizations, surgeries, and ER visits in previous 12 months J.  [place] such as hearing and vision if needed, cognitive and depression L. Referrals and appointments  In addition, I have reviewed and discussed with patient certain preventive protocols, quality metrics, and best practice recommendations. A written personalized care plan for preventive services as well as general preventive health recommendations were provided to patient.  Signed,  Aleatha Borer, LPN Nurse Health Advisor  MD Recommendations: Completed 1st dose of Shingrix on 10/26/17. Due for 2nd dose of Shingrix. Pt has been advised to provide a copy of her vaccination record once series is completed. Verbalized acceptance and understanding.  Diabetic Eye Exam: Completed 05/27/17. Pt was provided with a new eyeglass Rx. At this time, pt states she is unable to afford the cost of new glasses. Referral placed to [facility] to help off set cost of Rx glasses.  Fall Risk: Pt has fallen x2 getting in and out of shower. Struck head each time. Recommended pt place non-slip pad in shower to reduce fall risk until grab bars can be installed. Referral placed to [facility] for assistance with installation of grab bars. May also benefit from a bench, shower chair or walk in shower. Referral also placed to [facility] for further evaluation of available resources to reduce risk for fall.  Diabetes and nutrition: Pt recently lost her disability benefits and is unable to afford cost for food. Referral  placed for [facility] to help off set cost of food.

## 2017-11-30 NOTE — Patient Instructions (Signed)
Sheila Sandoval , Thank you for taking time to come for your Medicare Wellness Visit. I appreciate your ongoing commitment to your health goals. Please review the following plan we discussed and let me know if I can assist you in the future.   Screening recommendations/referrals: Colorectal Screening: Completed 09/09/15. Repeat every 10 years. Mammogram: Completed 05/27/17. Repeat every year  Bone Density: Completed 05/27/17. Osteoporotic screening no longer required  Vision and Dental Exams: Recommended annual ophthalmology exams for early detection of glaucoma and other disorders of the eye Recommended annual dental exams for proper oral hygiene  Diabetic Exams: Recommended annual diabetic eye exams for early detection of retinopathy Recommended annual diabetic foot exams for early detection of peripheral neuropathy.  Diabetic Eye Exam: Completed 05/27/17 Diabetic Foot Exam: Completed 10/26/17  Vaccinations: Influenza vaccine: Up to date Pneumococcal vaccine: Completed series Tdap vaccine: Up to date Shingles vaccine: Completed 10/26/17. Please call your insurance company to determine your out of pocket expense for the Shingrix vaccine. You may also receive this vaccine at your local pharmacy or Health Dept.   Advanced directives: Advance directive discussed with you today. I have provided a copy for you to complete at home and have notarized. Once this is complete please bring a copy in to our office so we can scan it into your chart.  Conditions/risks identified: Recommend to drink at least 6-8 8oz glasses of water per day.  Recommend to remove any items from the home that may cause slips or trips.  Next appointment: Please schedule your Annual Wellness Visit with your Nurse Health Advisor in one year.  Preventive Care 2 Years and Older, Female Preventive care refers to lifestyle choices and visits with your health care provider that can promote health and wellness. What does preventive  care include?  A yearly physical exam. This is also called an annual well check.  Dental exams once or twice a year.  Routine eye exams. Ask your health care provider how often you should have your eyes checked.  Personal lifestyle choices, including:  Daily care of your teeth and gums.  Regular physical activity.  Eating a healthy diet.  Avoiding tobacco and drug use.  Limiting alcohol use.  Practicing safe sex.  Taking low-dose aspirin every day.  Taking vitamin and mineral supplements as recommended by your health care provider. What happens during an annual well check? The services and screenings done by your health care provider during your annual well check will depend on your age, overall health, lifestyle risk factors, and family history of disease. Counseling  Your health care provider may ask you questions about your:  Alcohol use.  Tobacco use.  Drug use.  Emotional well-being.  Home and relationship well-being.  Sexual activity.  Eating habits.  History of falls.  Memory and ability to understand (cognition).  Work and work Statistician.  Reproductive health. Screening  You may have the following tests or measurements:  Height, weight, and BMI.  Blood pressure.  Lipid and cholesterol levels. These may be checked every 5 years, or more frequently if you are over 59 years old.  Skin check.  Lung cancer screening. You may have this screening every year starting at age 78 if you have a 30-pack-year history of smoking and currently smoke or have quit within the past 15 years.  Fecal occult blood test (FOBT) of the stool. You may have this test every year starting at age 8.  Flexible sigmoidoscopy or colonoscopy. You may have a sigmoidoscopy every 5  years or a colonoscopy every 10 years starting at age 75.  Hepatitis C blood test.  Hepatitis B blood test.  Sexually transmitted disease (STD) testing.  Diabetes screening. This is done by  checking your blood sugar (glucose) after you have not eaten for a while (fasting). You may have this done every 1-3 years.  Bone density scan. This is done to screen for osteoporosis. You may have this done starting at age 78.  Mammogram. This may be done every 1-2 years. Talk to your health care provider about how often you should have regular mammograms. Talk with your health care provider about your test results, treatment options, and if necessary, the need for more tests. Vaccines  Your health care provider may recommend certain vaccines, such as:  Influenza vaccine. This is recommended every year.  Tetanus, diphtheria, and acellular pertussis (Tdap, Td) vaccine. You may need a Td booster every 10 years.  Zoster vaccine. You may need this after age 75.  Pneumococcal 13-valent conjugate (PCV13) vaccine. One dose is recommended after age 65.  Pneumococcal polysaccharide (PPSV23) vaccine. One dose is recommended after age 66. Talk to your health care provider about which screenings and vaccines you need and how often you need them. This information is not intended to replace advice given to you by your health care provider. Make sure you discuss any questions you have with your health care provider. Document Released: 08/08/2015 Document Revised: 03/31/2016 Document Reviewed: 05/13/2015 Elsevier Interactive Patient Education  2017 [place] Prevention in the Home Falls can cause injuries. They can happen to people of all ages. There are many things you can do to make your home safe and to help prevent falls. What can I do on the outside of my home?  Regularly fix the edges of walkways and driveways and fix any cracks.  Remove anything that might make you trip as you walk through a door, such as a raised step or threshold.  Trim any bushes or trees on the path to your home.  Use bright outdoor lighting.  Clear any walking paths of anything that might make someone trip,  such as rocks or tools.  Regularly check to see if handrails are loose or broken. Make sure that both sides of any steps have handrails.  Any raised decks and porches should have guardrails on the edges.  Have any leaves, snow, or ice cleared regularly.  Use sand or salt on walking paths during winter.  Clean up any spills in your garage right away. This includes oil or grease spills. What can I do in the bathroom?  Use night lights.  Install grab bars by the toilet and in the tub and shower. Do not use towel bars as grab bars.  Use non-skid mats or decals in the tub or shower.  If you need to sit down in the shower, use a plastic, non-slip stool.  Keep the floor dry. Clean up any water that spills on the floor as soon as it happens.  Remove soap buildup in the tub or shower regularly.  Attach bath mats securely with double-sided non-slip rug tape.  Do not have throw rugs and other things on the floor that can make you trip. What can I do in the bedroom?  Use night lights.  Make sure that you have a light by your bed that is easy to reach.  Do not use any sheets or blankets that are too big for your bed. They should not hang  down onto the floor.  Have a firm chair that has side arms. You can use this for support while you get dressed.  Do not have throw rugs and other things on the floor that can make you trip. What can I do in the kitchen?  Clean up any spills right away.  Avoid walking on wet floors.  Keep items that you use a lot in easy-to-reach places.  If you need to reach something above you, use a strong step stool that has a grab bar.  Keep electrical cords out of the way.  Do not use floor polish or wax that makes floors slippery. If you must use wax, use non-skid floor wax.  Do not have throw rugs and other things on the floor that can make you trip. What can I do with my stairs?  Do not leave any items on the stairs.  Make sure that there are  handrails on both sides of the stairs and use them. Fix handrails that are broken or loose. Make sure that handrails are as long as the stairways.  Check any carpeting to make sure that it is firmly attached to the stairs. Fix any carpet that is loose or worn.  Avoid having throw rugs at the top or bottom of the stairs. If you do have throw rugs, attach them to the floor with carpet tape.  Make sure that you have a light switch at the top of the stairs and the bottom of the stairs. If you do not have them, ask someone to add them for you. What else can I do to help prevent falls?  Wear shoes that:  Do not have high heels.  Have rubber bottoms.  Are comfortable and fit you well.  Are closed at the toe. Do not wear sandals.  If you use a stepladder:  Make sure that it is fully opened. Do not climb a closed stepladder.  Make sure that both sides of the stepladder are locked into place.  Ask someone to hold it for you, if possible.  Clearly mark and make sure that you can see:  Any grab bars or handrails.  First and last steps.  Where the edge of each step is.  Use tools that help you move around (mobility aids) if they are needed. These include:  Canes.  Walkers.  Scooters.  Crutches.  Turn on the lights when you go into a dark area. Replace any light bulbs as soon as they burn out.  Set up your furniture so you have a clear path. Avoid moving your furniture around.  If any of your floors are uneven, fix them.  If there are any pets around you, be aware of where they are.  Review your medicines with your doctor. Some medicines can make you feel dizzy. This can increase your chance of falling. Ask your doctor what other things that you can do to help prevent falls. This information is not intended to replace advice given to you by your health care provider. Make sure you discuss any questions you have with your health care provider. Document Released: 05/08/2009  Document Revised: 12/18/2015 Document Reviewed: 08/16/2014 Elsevier Interactive Patient Education  2017 [employer].

## 2017-12-01 ENCOUNTER — Ambulatory Visit: Payer: Medicare PPO

## 2017-12-05 ENCOUNTER — Other Ambulatory Visit: Payer: Self-pay | Admitting: Internal Medicine

## 2017-12-05 NOTE — Addendum Note (Signed)
Addended by: Theresia Majors A on: 12/05/2017 04:18 PM   Modules accepted: Level of Service

## 2017-12-07 ENCOUNTER — Other Ambulatory Visit: Payer: Self-pay | Admitting: Internal Medicine

## 2017-12-07 DIAGNOSIS — E782 Mixed hyperlipidemia: Secondary | ICD-10-CM

## 2017-12-07 MED ORDER — TRIAMCINOLONE ACETONIDE 0.1 % EX CREA: 1.0000 "application " | TOPICAL_CREAM | Freq: Two times a day (BID) | CUTANEOUS | 0 refills | Status: DC

## 2017-12-16 DIAGNOSIS — E119 Type 2 diabetes mellitus without complications: Secondary | ICD-10-CM | POA: Diagnosis not present

## 2017-12-16 DIAGNOSIS — H1851 Endothelial corneal dystrophy: Secondary | ICD-10-CM | POA: Diagnosis not present

## 2017-12-16 DIAGNOSIS — H2513 Age-related nuclear cataract, bilateral: Secondary | ICD-10-CM | POA: Diagnosis not present

## 2017-12-16 DIAGNOSIS — H527 Unspecified disorder of refraction: Secondary | ICD-10-CM | POA: Diagnosis not present

## 2017-12-16 LAB — HM DIABETES EYE EXAM

## 2018-01-02 ENCOUNTER — Encounter: Payer: Self-pay | Admitting: *Deleted

## 2018-01-02 ENCOUNTER — Other Ambulatory Visit: Payer: Self-pay | Admitting: *Deleted

## 2018-01-02 NOTE — Patient Outreach (Signed)
[facility]) Care Management  01/02/2018   Sheila Sandoval August 29, 1945 [id number]  RN Health Coach telephone call to patient.  [employer] compliance verified. Per patient she checks her blood sugar 2-3 times a week. Lastcheck blood sugar was 137 non fasting last week. Per patient she is on a limited income and is unable to afford healthy food. Per patient she has some bad teeth that needs to seen by a dentist and she can't afford to go.  Per patient she is doing home routine exercises. Patient has hx of hypertension and blood pressure is on the borderline. Patient BP cuff is broken. Patient weighs weekly and per patient she tries to eat healthy with the food she has but doesn't loose weight. Per patient she is taking medications as per ordered.Patient has agreed to follow up outreach calls.    Current Medications:  Current Outpatient Medications  Medication Sig Dispense Refill  . amLODipine (NORVASC) 10 MG tablet TAKE 1 TABLET EVERY DAY 90 tablet 3  . ammonium lactate (AMLACTIN) 12 % cream Apply topically as needed for dry skin.    Marland Kitchen aspirin 81 MG tablet Take 1 tablet by mouth daily.    . Blood Glucose Calibration (ACCU-CHEK AVIVA) SOLN 1 each by In Vitro route daily as needed. 1 each 0  . Blood Glucose Monitoring Suppl (ACCU-CHEK AVIVA PLUS) w/Device KIT USE TWICE DAILY 1 kit 0  . Cholecalciferol 2000 units CAPS Take 1 capsule by mouth daily.    . diclofenac sodium (VOLTAREN) 1 % GEL Apply 2 g topically 4 (four) times daily. 100 g 3  . estradiol (ESTRACE) 0.5 MG tablet TAKE 1 TABLET BY MOUTH 3 TIMES A WEEK 36 tablet 12  . ezetimibe (ZETIA) 10 MG tablet TAKE 1 TABLET EVERY DAY 90 tablet 3  . Ferrous Gluconate 325 (36 FE) MG TABS Take by mouth.    . fluticasone (FLONASE) 50 MCG/ACT nasal spray Place 2 sprays into both nostrils daily. 48 g 3  . glimepiride (AMARYL) 2 MG tablet TAKE 1 TABLET EVERY DAY WITH BREAKFAST 90 tablet 3  . glucose blood (ACCU-CHEK AVIVA PLUS) test strip  1 each by Other route 2 (two) times daily. 200 each 3  . JARDIANCE 25 MG TABS tablet TAKE 1 TABLET EVERY DAY 90 tablet 3  . Lancet Devices (ACCU-CHEK SOFTCLIX) lancets 1 each by Other route 2 (two) times daily. 200 each 3  . meloxicam (MOBIC) 15 MG tablet TAKE 1 TABLET EVERY DAY 90 tablet 3  . ramipril (ALTACE) 5 MG capsule TAKE 1 CAPSULE EVERY DAY 90 capsule 3  . rosuvastatin (CRESTOR) 5 MG tablet TAKE 1 TABLET EVERY DAY 90 tablet 3  . sitaGLIPtin-metformin (JANUMET) 50-1000 MG tablet Take 1 tablet by mouth 2 (two) times daily with a meal. 180 tablet 3  . triamcinolone cream (KENALOG) 0.1 % Apply 1 application topically 2 (two) times daily. 30 g 0  . triamterene-hydrochlorothiazide (MAXZIDE-25) 37.5-25 MG tablet TAKE 1 TABLET EVERY DAY 90 tablet 3   No current facility-administered medications for this visit.     Functional Status:  In your present state of health, do you have any difficulty performing the following activities: 01/02/2018 11/30/2017  Hearing? N N  Comment - denies hearing aids  Vision? N N  Comment - has a Rx for eyeglasses but unable to afford them. Referral sent to [facility]; cataracts  Difficulty concentrating or making decisions? N N  Walking or climbing stairs? N Y  Comment - fatigue  Dressing or  bathing? N N  Doing errands, shopping? N N  Preparing Food and eating ? N N  Comment - full set upper and lower dentures  Using the Toilet? N N  In the past six months, have you accidently leaked urine? N N  Do you have problems with loss of bowel control? N N  Managing your Medications? N N  Managing your Finances? N N  Housekeeping or managing your Housekeeping? N N  Some recent data might be hidden    Fall/Depression Screening: Fall Risk  01/02/2018 11/30/2017 11/05/2016  Falls in the past year? Yes Yes No  Comment - fell getting in and out of the shower. Struck head each time. No fall risk prevention in place at this time. Education provide and referral placed -  Number  falls in past yr: 2 or more 2 or more -  Injury with Fall? Yes Yes -  Comment bruising from hitting head - -  Risk Factor Category  High Fall Risk High Fall Risk -  Comment - all falls occured in shower. Pt denies having anything in her shower to reduce risk for falls. Referral to [facility] placed -  Risk for fall due to : History of fall(s) History of fall(s);Other (Comment) -  Risk for fall due to: Comment - no safety precautions in place at home to reduce her risk for fall -  Follow up Falls evaluation completed;Falls prevention discussed;Education provided Falls evaluation completed;Education provided;Falls prevention discussed -   PHQ 2/9 Scores 01/02/2018 11/30/2017 11/05/2016 10/23/2015 04/11/2015  PHQ - 2 Score 2 2 0 0 0  PHQ- 9 Score 4 4 - - -   THN CM Care Plan Problem One     Most Recent Value  Care Plan Problem One  Knowledge Deficit in Diabetes Self Management  Role Documenting the Problem One  [place] for Problem One  Active  THN Long Term Goal   Patient will see A1C decrease from 8.0 with next blood draw  THN Long Term Goal Start Date  01/02/18  Interventions for Problem One Long Term Goal  RN discussed what the fasting blood sugar needs to be to see a decrase in the A1C <7. RN sent living well with diabetes book. RN sent EMMI educatioanl material on A1C. RN will foolow up with further discussion and teach back  THN CM Short Term Goal #1   Patient will check blood sugars three times a week within the next 30 days  THN CM Short Term Goal #1 Start Date  01/02/18  Interventions for Short Term Goal #1   Patient will check  blood glucose 3 x week and document.RN discussed the reason for checking fasting blood sugar. RN sent 2019 Calendar book for documenting. RN will follow up with further discussion  THN CM Short Term Goal #2   Patient will be able to describe the signs and symptoms of hypo and hyperglycemia within the next 30 days  THN CM Short Term Goal #2 Start Date   01/02/18  Interventions for Short Term Goal #2  RN discussed the patient signs and symptoms of Hypo and Hyperglycemia. RN sent picture chart of hypo and hyperglycemia. RN sent EMMI education on hyper and hypoglycemia. RN will follow up with further discussion      Assessment:  A1C 8.0 Patient checks blood sugar 2-3 times a week Patient has financial difficulties affording health foods BP cuff broken Patient has a routine home exercise program Patient will benefit from  Health Coach telephonic outreach for education and support for diabetes self management. Plan:  RN referred to Education officer, museum RN discussed A1C RN discussed hypo and hyperglycemia RN sent facial sheet of hypo and hyperglycemia symptoms RN sent 2019 calendar book RN sent Advance Directive packet RN sent Living Well with Diabetes RN sent barriers letter and assessment to PCP RN will follow up with further outreach within the month of July  Estefani Bateson [place] Management [phone]

## 2018-01-03 ENCOUNTER — Encounter: Payer: Self-pay | Admitting: *Deleted

## 2018-01-04 ENCOUNTER — Other Ambulatory Visit: Payer: Self-pay | Admitting: *Deleted

## 2018-01-04 NOTE — Patient Outreach (Signed)
[facility]) Care Management  01/04/2018  AUNYA LEMLER 1946/02/05 [id number]   Patient referred to this social worker to assist with a ist of area food pantries and resources for dental care. Per patient, she has lost her disability and is only receiving social security at this time. Patient states that she is having difficulty affording healthy food options. This Education officer, museum discussed the option of utilizing her local food pantries. Local list of pantries offered to be sent ot her home. Patient was adamant that she did not want the list. "I don't have the time" I know where they are, I just don't want to go to them". Patient states that they have a food pantry at her church that she could use if needed. Patient also discussed needing a new pair of dentures, stating that her old ones are old and need to be replaced. This Education officer, museum provided patient with the contact number to the [facility] [phone]. It was explained that she would need to call to request that an application be sent to her by mail. Once complete, she will be entered into a monthly drawing. If chosen, she will be placed on a waiting list to be contacted for a screening appointment. Patient agrees to contact them to complete the application process. Per patient, she had food stamps in the past and received $10.00 per month, patient states that her income has gone down since that time. It was strongly suggested that patient go to her local department of social services to re-apply due to her income not being significantly lower. Patient agrees.  Patient verbalized having no additional community resource needs at this time. Patient encouraged to call this social worker if there are any community resources needs that arise in the future.    Sheralyn Boatman [facility] Care Management [phone]

## 2018-02-01 ENCOUNTER — Other Ambulatory Visit: Payer: Self-pay | Admitting: *Deleted

## 2018-02-01 NOTE — Patient Outreach (Signed)
[facility] [facility]) Care Management  02/01/2018   Sheila Sandoval 1946/03/01 [id number]  RN Health Coach telephone call to patient.  Hipaa compliance verified. Patient verbalized receiving blood pressure cuff and educational material.  Per patient she had checked her blood sugar last week. Patient stated it was 157 fasting. Patient stated she has lost weight on her scale she weighed 165 pounds. Patient stated she went for eye exam and she is going to have to have cataract surgery because her vision is getting blurry. Patient stated she has scheduled a dental appointment for tomorrow to see about getting her teeth an partial fixed. Patient has agreed to further outreach calls.   Current Medications:  Current Outpatient Medications  Medication Sig Dispense Refill  . amLODipine (NORVASC) 10 MG tablet TAKE 1 TABLET EVERY DAY 90 tablet 3  . ammonium lactate (AMLACTIN) 12 % cream Apply topically as needed for dry skin.    Marland Kitchen aspirin 81 MG tablet Take 1 tablet by mouth daily.    . Blood Glucose Calibration (ACCU-CHEK AVIVA) SOLN 1 each by In Vitro route daily as needed. 1 each 0  . Blood Glucose Monitoring Suppl (ACCU-CHEK AVIVA PLUS) w/Device KIT USE TWICE DAILY 1 kit 0  . Cholecalciferol 2000 units CAPS Take 1 capsule by mouth daily.    . diclofenac sodium (VOLTAREN) 1 % GEL Apply 2 g topically 4 (four) times daily. 100 g 3  . estradiol (ESTRACE) 0.5 MG tablet TAKE 1 TABLET BY MOUTH 3 TIMES A WEEK 36 tablet 12  . ezetimibe (ZETIA) 10 MG tablet TAKE 1 TABLET EVERY DAY 90 tablet 3  . Ferrous Gluconate 325 (36 FE) MG TABS Take by mouth.    . fluticasone (FLONASE) 50 MCG/ACT nasal spray Place 2 sprays into both nostrils daily. 48 g 3  . glimepiride (AMARYL) 2 MG tablet TAKE 1 TABLET EVERY DAY WITH BREAKFAST 90 tablet 3  . glucose blood (ACCU-CHEK AVIVA PLUS) test strip 1 each by Other route 2 (two) times daily. 200 each 3  . JARDIANCE 25 MG TABS tablet TAKE 1 TABLET EVERY DAY 90 tablet  3  . Lancet Devices (ACCU-CHEK SOFTCLIX) lancets 1 each by Other route 2 (two) times daily. 200 each 3  . meloxicam (MOBIC) 15 MG tablet TAKE 1 TABLET EVERY DAY 90 tablet 3  . ramipril (ALTACE) 5 MG capsule TAKE 1 CAPSULE EVERY DAY 90 capsule 3  . rosuvastatin (CRESTOR) 5 MG tablet TAKE 1 TABLET EVERY DAY 90 tablet 3  . sitaGLIPtin-metformin (JANUMET) 50-1000 MG tablet Take 1 tablet by mouth 2 (two) times daily with a meal. 180 tablet 3  . triamcinolone cream (KENALOG) 0.1 % Apply 1 application topically 2 (two) times daily. 30 g 0  . triamterene-hydrochlorothiazide (MAXZIDE-25) 37.5-25 MG tablet TAKE 1 TABLET EVERY DAY 90 tablet 3   No current facility-administered medications for this visit.     Functional Status:  In your present state of health, do you have any difficulty performing the following activities: 02/01/2018 01/02/2018  Hearing? N N  Comment - -  Vision? N N  Comment - -  Difficulty concentrating or making decisions? N N  Walking or climbing stairs? N N  Comment - -  Dressing or bathing? N N  Doing errands, shopping? N N  Conservation officer, nature and eating ? - N  Comment - -  Using the Toilet? - N  In the past six months, have you accidently leaked urine? - N  Do you have problems with  loss of bowel control? - N  Managing your Medications? - N  Managing your Finances? - N  Housekeeping or managing your Housekeeping? - N  Some recent data might be hidden    Fall/Depression Screening: Fall Risk  02/01/2018 01/02/2018 11/30/2017  Falls in the past year? Yes Yes Yes  Comment - - fell getting in and out of the shower. Struck head each time. No fall risk prevention in place at this time. Education provide and referral placed  Number falls in past yr: 2 or more 2 or more 2 or more  Injury with Fall? Yes Yes Yes  Comment - bruising from hitting head -  Risk Factor Category  High Fall Risk High Fall Risk High Fall Risk  Comment - - all falls occured in shower. Pt denies having  anything in her shower to reduce risk for falls. Referral to C3 placed  Risk for fall due to : History of fall(s);Impaired balance/gait;Impaired mobility History of fall(s) History of fall(s);Other (Comment)  Risk for fall due to: Comment - - no safety precautions in place at home to reduce her risk for fall  Follow up Falls evaluation completed;Education provided;Falls prevention discussed Falls evaluation completed;Falls prevention discussed;Education provided Falls evaluation completed;Education provided;Falls prevention discussed   PHQ 2/9 Scores 02/01/2018 01/02/2018 11/30/2017 11/05/2016 10/23/2015 04/11/2015  PHQ - 2 Score 2 2 2  0 0 0  PHQ- 9 Score 4 4 4  - - -   THN CM Care Plan Problem One     Most Recent Value  Care Plan Problem One  Knowledge Deficit in Diabetes Self Management  Role Documenting the Problem One  [place] for Problem One  Active  THN Long Term Goal   Patient will see A1C decrease from 8.0 with next blood draw  Interventions for Problem One Long Term Goal  RN discussed what the last blood sugar was. It is still elevated. RN discussed what the blood sugar should be to see a decrease in A1C.N will follow up with further discussion  THN CM Short Term Goal #1   Patient will check blood sugars three times a week within the next 30 days  THN CM Short Term Goal #1 Start Date  02/01/18  Interventions for Short Term Goal #1  RN discussed patient when patient was checking blood sugars. Patient eceived calendar book but is not wtilizing. RN will follow up with further check on compliance  THN CM Short Term Goal #2   Patient will be able to describe the signs and symptoms of hypo and hyperglycemia within the next 30 days  THN CM Short Term Goal #2 Met Date  02/01/18  [facility] CM Short Term Goal #3  Patient will follow up dental appointment within the next 30 days  THN CM Short Term Goal #3 Start Date  02/01/18  Interventions for Short Tern Goal #3  Patient has set up a dental  exam for 02/02/2018. RN will follow up patient for keeping appointment. RN will send patient educational material on dental care and diabetes  THN CM Short Term Goal #4  Patient will verbalize receiving information on portion control within the next 30 days  THN CM Short Term Goal #4 Start Date  02/01/18  Interventions for Short Term Goal #4  Patient is wanting to loose weight. RN willsend Neurosurgeon on serving size and diabetic snacks. RN will follow up with further discussion and teachback      Assessment:  Patient is checking blood  sugar at intervals Patient stated needs cataract surgery Patient has Dental caries Patient received blood pressure monitor and educational supplies Patient will continue to benefit from [place] telephonic outreach for education and support for diabetes self management.  Plan:  RN discussed checking blood sugar RN sent educational material on portion control RN sent educational material on healthy snacks RN sent educational material on foods to dine out RN will follow up with further discussion and teach back in the month of August  Mauricio Dahlen [place] [facility] [phone]

## 2018-02-02 ENCOUNTER — Other Ambulatory Visit (INDEPENDENT_AMBULATORY_CARE_PROVIDER_SITE_OTHER): Payer: Medicare PPO

## 2018-02-02 DIAGNOSIS — E538 Deficiency of other specified B group vitamins: Secondary | ICD-10-CM

## 2018-02-02 MED ORDER — CYANOCOBALAMIN 1000 MCG/ML IJ SOLN
1000.0000 ug | INTRAMUSCULAR | Status: AC
  Administered 2018-02-02 – 2021-04-07 (×5): 1000 ug via INTRAMUSCULAR

## 2018-02-23 ENCOUNTER — Other Ambulatory Visit: Payer: Self-pay | Admitting: Internal Medicine

## 2018-02-24 ENCOUNTER — Other Ambulatory Visit: Payer: Self-pay

## 2018-02-24 ENCOUNTER — Other Ambulatory Visit: Payer: Self-pay | Admitting: Internal Medicine

## 2018-02-24 MED ORDER — SITAGLIPTIN PHOS-METFORMIN HCL 50-1000 MG PO TABS: 1.0000 | ORAL_TABLET | Freq: Two times a day (BID) | ORAL | 1 refills | Status: DC

## 2018-03-06 ENCOUNTER — Other Ambulatory Visit: Payer: Self-pay | Admitting: Internal Medicine

## 2018-03-06 MED ORDER — ESTRADIOL 0.5 MG PO TABS: 0.5000 mg | ORAL_TABLET | Freq: Every day | ORAL | 3 refills | Status: DC

## 2018-03-08 ENCOUNTER — Other Ambulatory Visit: Payer: Self-pay | Admitting: *Deleted

## 2018-03-08 ENCOUNTER — Ambulatory Visit: Payer: Self-pay | Admitting: *Deleted

## 2018-03-08 NOTE — Patient Outreach (Signed)
[place] [facility]) Care Management  03/08/2018  Sheila Sandoval 04/20/1946 [id number]   RN Health Coach attempted follow up outreach call to patient.  Patient was unavailable. HIPPA compliance voicemail message left with return callback number.  Plan: RN will call patient again within  10 business days.  [place] Care Management [phone]

## 2018-03-10 ENCOUNTER — Encounter: Payer: Self-pay | Admitting: Internal Medicine

## 2018-03-10 ENCOUNTER — Ambulatory Visit: Payer: Medicare PPO | Admitting: Internal Medicine

## 2018-03-10 ENCOUNTER — Other Ambulatory Visit: Payer: Self-pay | Admitting: Internal Medicine

## 2018-03-15 ENCOUNTER — Ambulatory Visit: Payer: Self-pay | Admitting: *Deleted

## 2018-03-20 ENCOUNTER — Ambulatory Visit: Payer: Medicare PPO | Admitting: Internal Medicine

## 2018-03-21 ENCOUNTER — Other Ambulatory Visit: Payer: Self-pay | Admitting: *Deleted

## 2018-03-21 NOTE — Patient Outreach (Signed)
[place] [facility]) Care Management  03/21/2018  STARLET GALLENTINE 27-Apr-1946 [id number]  RN Health Coach attempted#2 follow up outreach call to patient.  Person answering stated she was not there.   Plan: RN will call patient again within 10 busines days. RN sent out unsuccessful outreach letter  [place] Management [phone]

## 2018-04-03 ENCOUNTER — Other Ambulatory Visit: Payer: Self-pay | Admitting: Internal Medicine

## 2018-04-04 ENCOUNTER — Other Ambulatory Visit: Payer: Self-pay | Admitting: *Deleted

## 2018-04-04 NOTE — Patient Outreach (Signed)
[place] [facility]) Care Management  04/04/2018  SHACARA COZINE 1945-12-21 [id number]  RN Health Coach attempted #3 follow up outreach call to patient.  Patient was unavailable. HIPPA compliance voicemail message left with return callback number.  Plan: RN will close case Multiple attempts to establish contact with patient without success. No response from letter mailed to patient. Case is being closed at this time. RN will send letter to patient and PCP notifying of closure.  [place] Care Management [phone]

## 2018-04-07 ENCOUNTER — Ambulatory Visit: Payer: Medicare PPO | Admitting: Internal Medicine

## 2018-04-07 ENCOUNTER — Encounter: Payer: Self-pay | Admitting: Internal Medicine

## 2018-04-07 VITALS — BP 112/68 | HR 72 | Ht 62.0 in | Wt 172.0 lb

## 2018-04-07 DIAGNOSIS — E785 Hyperlipidemia, unspecified: Secondary | ICD-10-CM

## 2018-04-07 DIAGNOSIS — E1169 Type 2 diabetes mellitus with other specified complication: Secondary | ICD-10-CM | POA: Diagnosis not present

## 2018-04-07 DIAGNOSIS — K219 Gastro-esophageal reflux disease without esophagitis: Secondary | ICD-10-CM | POA: Diagnosis not present

## 2018-04-07 DIAGNOSIS — Z1231 Encounter for screening mammogram for malignant neoplasm of breast: Secondary | ICD-10-CM | POA: Diagnosis not present

## 2018-04-07 DIAGNOSIS — I10 Essential (primary) hypertension: Secondary | ICD-10-CM | POA: Diagnosis not present

## 2018-04-07 DIAGNOSIS — E538 Deficiency of other specified B group vitamins: Secondary | ICD-10-CM

## 2018-04-07 DIAGNOSIS — E118 Type 2 diabetes mellitus with unspecified complications: Secondary | ICD-10-CM | POA: Diagnosis not present

## 2018-04-07 DIAGNOSIS — Z23 Encounter for immunization: Secondary | ICD-10-CM

## 2018-04-07 DIAGNOSIS — E559 Vitamin D deficiency, unspecified: Secondary | ICD-10-CM | POA: Diagnosis not present

## 2018-04-07 DIAGNOSIS — Z1239 Encounter for other screening for malignant neoplasm of breast: Secondary | ICD-10-CM

## 2018-04-07 MED ORDER — CYANOCOBALAMIN 1000 MCG/ML IJ SOLN
1000.0000 ug | Freq: Once | INTRAMUSCULAR | Status: AC
  Administered 2018-04-07: 1000 ug via INTRAMUSCULAR

## 2018-04-07 NOTE — Progress Notes (Signed)
Date:  04/07/2018   Name:  Sheila Sandoval   DOB:  [birth year]   MRN:  [id number]   Chief Complaint: Hypertension; Diabetes; and B12 deficiency (B12 injection. ) Diabetes  She presents for her follow-up diabetic visit. She has type 2 diabetes mellitus. Her disease course has been stable. Pertinent negatives for hypoglycemia include no headaches or tremors. Pertinent negatives for diabetes include no chest pain, no fatigue, no polydipsia and no polyuria. Symptoms are stable. Current diabetic treatments: janumet, jardiance, glipizide. She is compliant with treatment all of the time. Her weight is stable. There is no change in her home blood glucose trend. An ACE inhibitor/angiotensin II receptor blocker is being taken. Eye exam is current.  Hypertension  The problem is controlled. Pertinent negatives include no chest pain, headaches, palpitations or shortness of breath. Past treatments include ACE inhibitors.  Gastroesophageal Reflux  She complains of heartburn. She reports no abdominal pain, no chest pain or no coughing. This is a chronic problem. The problem occurs occasionally. Pertinent negatives include no fatigue. She has tried a PPI for the symptoms.   Lab Results  Component Value Date   HGBA1C 8.0 (H) 10/26/2017   Lab Results  Component Value Date   CHOL 125 10/26/2017   HDL 53 10/26/2017   LDLCALC 60 10/26/2017   TRIG 61 10/26/2017   CHOLHDL 2.4 10/26/2017      Review of Systems  Constitutional: Negative for appetite change, fatigue, fever and unexpected weight change.  HENT: Negative for tinnitus and trouble swallowing.   Eyes: Negative for visual disturbance.  Respiratory: Negative for cough, chest tightness and shortness of breath.   Cardiovascular: Negative for chest pain, palpitations and leg swelling.  Gastrointestinal: Positive for heartburn. Negative for abdominal pain.  Endocrine: Negative for polydipsia and polyuria.  Genitourinary: Negative for dysuria and  hematuria.  Musculoskeletal: Positive for arthralgias (left upper arm aches).  Skin: Positive for color change and rash.  Neurological: Negative for tremors, numbness and headaches.  Psychiatric/Behavioral: Negative for dysphoric mood and sleep disturbance.    Patient Active Problem List   Diagnosis Date Noted  . Anatomical narrow angle glaucoma with borderline intraocular pressure 07/15/2017  . Vitamin D deficiency 03/11/2017  . Hypercalcemia 10/02/2016  . Diabetes mellitus type 2, controlled, with complications ([place]) 37/16/9678  . Pernicious anemia 04/11/2015  . Carpal tunnel syndrome 01/14/2015  . Hyperlipidemia associated with type 2 diabetes mellitus ([place]) 01/14/2015  . Essential hypertension 01/14/2015  . Climacteric 01/14/2015  . Muscle fatigue 01/14/2015  . Arthritis of hand, degenerative 01/14/2015  . GERD (gastroesophageal reflux disease) 10/29/2011  . Spondylolisthesis 10/29/2011    Allergies  Allergen Reactions  . Atorvastatin     chest tightness  . Tramadol Hcl Nausea And Vomiting    Past Surgical History:  Procedure Laterality Date  . ABDOMINAL HYSTERECTOMY  1981  . APPENDECTOMY  1981  . REPAIR DURAL / CSF LEAK  2002    Social History   Tobacco Use  . Smoking status: Never Smoker  . Smokeless tobacco: Never Used  . Tobacco comment: smoking cessation material not required  Substance Use Topics  . Alcohol use: No    Alcohol/week: 0.0 standard drinks  . Drug use: No     Medication list has been reviewed and updated.  Current Meds  Medication Sig  . amLODipine (NORVASC) 10 MG tablet TAKE 1 TABLET EVERY DAY  . ammonium lactate (AMLACTIN) 12 % cream Apply topically as needed for dry skin.  Marland Kitchen aspirin  81 MG tablet Take 1 tablet by mouth daily.  . Blood Glucose Calibration (ACCU-CHEK AVIVA) SOLN 1 each by In Vitro route daily as needed.  . Blood Glucose Monitoring Suppl (ACCU-CHEK AVIVA PLUS) w/Device KIT USE TWICE DAILY  . Cholecalciferol 2000 units  CAPS Take 1 capsule by mouth daily.  . diclofenac sodium (VOLTAREN) 1 % GEL Apply 2 g topically 4 (four) times daily.  Marland Kitchen estradiol (ESTRACE) 0.5 MG tablet Take 1 tablet (0.5 mg total) by mouth daily.  Marland Kitchen ezetimibe (ZETIA) 10 MG tablet TAKE 1 TABLET EVERY DAY  . Ferrous Gluconate 325 (36 FE) MG TABS Take by mouth.  . fluticasone (FLONASE) 50 MCG/ACT nasal spray Place 2 sprays into both nostrils daily.  Marland Kitchen glimepiride (AMARYL) 2 MG tablet TAKE 1 TABLET EVERY DAY WITH BREAKFAST  . glucose blood (ACCU-CHEK AVIVA PLUS) test strip 1 each by Other route 2 (two) times daily.  Marland Kitchen ibuprofen (ADVIL,MOTRIN) 800 MG tablet Take 1 tablet (800 mg total) by mouth 2 (two) times daily as needed.  Marland Kitchen JANUMET 50-1000 MG tablet TAKE 1 TABLET BY MOUTH TWICE DAILY WITH MEALS.  Marland Kitchen JARDIANCE 25 MG TABS tablet TAKE 1 TABLET EVERY DAY  . Lancet Devices (ACCU-CHEK SOFTCLIX) lancets 1 each by Other route 2 (two) times daily.  . meloxicam (MOBIC) 15 MG tablet TAKE 1 TABLET EVERY DAY  . ramipril (ALTACE) 5 MG capsule TAKE 1 CAPSULE EVERY DAY  . rosuvastatin (CRESTOR) 5 MG tablet TAKE 1 TABLET EVERY DAY  . sitaGLIPtin-metformin (JANUMET) 50-1000 MG tablet Take 1 tablet by mouth 2 (two) times daily with a meal.  . triamcinolone cream (KENALOG) 0.1 % APPLY TO AFFECTED AREA TWICE DAILY.  Marland Kitchen triamterene-hydrochlorothiazide (MAXZIDE-25) 37.5-25 MG tablet TAKE 1 TABLET EVERY DAY   Current Facility-Administered Medications for the 04/07/18 encounter (Office Visit) with Glean Hess, MD  Medication  . cyanocobalamin ((VITAMIN B-12)) injection 1,000 mcg    PHQ 2/9 Scores 02/01/2018 01/02/2018 11/30/2017 11/05/2016  PHQ - 2 Score 2 2 2  0  PHQ- 9 Score 4 4 4  -    Physical Exam  Constitutional: She is oriented to person, place, and time. She appears well-developed. No distress.  HENT:  Head: Normocephalic and atraumatic.  Neck: Normal range of motion.  Cardiovascular: Normal rate, regular rhythm and normal heart sounds.    Pulmonary/Chest: Effort normal and breath sounds normal. No respiratory distress.  Musculoskeletal: Normal range of motion. She exhibits no edema.  Neurological: She is alert and oriented to person, place, and time.  Skin: Skin is warm and dry. No rash noted.  Psychiatric: She has a normal mood and affect. Her behavior is normal. Thought content normal.  Nursing note and vitals reviewed.   BP 112/68 (BP Location: Right Arm, Patient Position: Sitting, Cuff Size: Normal)   Pulse 72   Ht 5' 2"  (1.575 m)   Wt 172 lb (78 kg)   SpO2 98%   BMI 31.46 kg/m   Assessment and Plan: 1. Essential hypertension controlled  2. Controlled type 2 diabetes mellitus with complication, without long-term current use of insulin ([place]) May need to add or change medication - would recommend GLP-1 - Hemoglobin A1c - Comprehensive metabolic panel  3. Hyperlipidemia associated with type 2 diabetes mellitus ([place]) On statin therapy Can d/c Zetia  4. Gastroesophageal reflux disease, esophagitis presence not specified controlled  5. Vitamin D deficiency Check level and advise - VITAMIN D 25 Hydroxy (Vit-D Deficiency, Fractures)  6. Breast cancer screening Schedule at [facility] - MM  3D SCREEN BREAST BILATERAL  7. Need for influenza vaccination - Flu vaccine HIGH DOSE PF  8. B12 deficiency - cyanocobalamin ((VITAMIN B-12)) injection 1,000 mcg

## 2018-04-08 LAB — COMPREHENSIVE METABOLIC PANEL
ALK PHOS: 80 IU/L (ref 39–117)
ALT: 21 IU/L (ref 0–32)
AST: 19 IU/L (ref 0–40)
Albumin/Globulin Ratio: 1.6 (ref 1.2–2.2)
Albumin: 4.4 g/dL (ref 3.5–4.8)
BILIRUBIN TOTAL: 0.4 mg/dL (ref 0.0–1.2)
BUN/Creatinine Ratio: 16 (ref 12–28)
BUN: 18 mg/dL (ref 8–27)
CHLORIDE: 101 mmol/L (ref 96–106)
CO2: 23 mmol/L (ref 20–29)
CREATININE: 1.13 mg/dL — AB (ref 0.57–1.00)
Calcium: 10.4 mg/dL — ABNORMAL HIGH (ref 8.7–10.3)
GFR calc non Af Amer: 49 mL/min/{1.73_m2} — ABNORMAL LOW (ref >59)
GFR, EST AFRICAN AMERICAN: 56 mL/min/{1.73_m2} — AB (ref >59)
GLUCOSE: 128 mg/dL — AB (ref 65–99)
Globulin, Total: 2.7 g/dL (ref 1.5–4.5)
Potassium: 4.5 mmol/L (ref 3.5–5.2)
Sodium: 142 mmol/L (ref 134–144)
TOTAL PROTEIN: 7.1 g/dL (ref 6.0–8.5)

## 2018-04-08 LAB — HEMOGLOBIN A1C
Est. average glucose Bld gHb Est-mCnc: 186 mg/dL
Hgb A1c MFr Bld: 8.1 % — ABNORMAL HIGH (ref 4.8–5.6)

## 2018-04-08 LAB — VITAMIN D 25 HYDROXY (VIT D DEFICIENCY, FRACTURES): VIT D 25 HYDROXY: 40.4 ng/mL (ref 30.0–100.0)

## 2018-05-06 ENCOUNTER — Other Ambulatory Visit: Payer: Self-pay | Admitting: Internal Medicine

## 2018-05-12 DIAGNOSIS — H527 Unspecified disorder of refraction: Secondary | ICD-10-CM | POA: Diagnosis not present

## 2018-05-12 DIAGNOSIS — H2513 Age-related nuclear cataract, bilateral: Secondary | ICD-10-CM | POA: Diagnosis not present

## 2018-05-12 DIAGNOSIS — H1851 Endothelial corneal dystrophy: Secondary | ICD-10-CM | POA: Diagnosis not present

## 2018-05-16 ENCOUNTER — Other Ambulatory Visit: Payer: Self-pay

## 2018-05-16 MED ORDER — AMMONIUM LACTATE 12 % EX CREA: TOPICAL_CREAM | CUTANEOUS | 1 refills | Status: DC | PRN

## 2018-05-16 MED ORDER — DICLOFENAC SODIUM 1 % TD GEL: 2.0000 g | Freq: Four times a day (QID) | TRANSDERMAL | 3 refills | Status: DC

## 2018-05-22 ENCOUNTER — Other Ambulatory Visit: Payer: Self-pay | Admitting: Internal Medicine

## 2018-05-22 DIAGNOSIS — R21 Rash and other nonspecific skin eruption: Secondary | ICD-10-CM

## 2018-05-22 MED ORDER — TRIAMCINOLONE ACETONIDE 0.1 % EX CREA: TOPICAL_CREAM | Freq: Two times a day (BID) | CUTANEOUS | 1 refills | Status: DC

## 2018-06-19 ENCOUNTER — Other Ambulatory Visit: Payer: Self-pay | Admitting: Internal Medicine

## 2018-07-07 ENCOUNTER — Encounter: Payer: Self-pay | Admitting: Internal Medicine

## 2018-07-07 ENCOUNTER — Ambulatory Visit: Payer: Medicare PPO | Admitting: Internal Medicine

## 2018-07-07 VITALS — BP 122/78 | HR 92 | Resp 16 | Ht 62.0 in | Wt 170.0 lb

## 2018-07-07 DIAGNOSIS — E118 Type 2 diabetes mellitus with unspecified complications: Secondary | ICD-10-CM | POA: Diagnosis not present

## 2018-07-07 DIAGNOSIS — M7062 Trochanteric bursitis, left hip: Secondary | ICD-10-CM

## 2018-07-07 DIAGNOSIS — D510 Vitamin B12 deficiency anemia due to intrinsic factor deficiency: Secondary | ICD-10-CM

## 2018-07-07 DIAGNOSIS — E538 Deficiency of other specified B group vitamins: Secondary | ICD-10-CM | POA: Diagnosis not present

## 2018-07-07 DIAGNOSIS — Z1231 Encounter for screening mammogram for malignant neoplasm of breast: Secondary | ICD-10-CM | POA: Diagnosis not present

## 2018-07-07 DIAGNOSIS — R1030 Lower abdominal pain, unspecified: Secondary | ICD-10-CM | POA: Diagnosis not present

## 2018-07-07 LAB — POCT URINALYSIS DIPSTICK
Bilirubin, UA: NEGATIVE
Blood, UA: NEGATIVE
GLUCOSE UA: POSITIVE — AB
Ketones, UA: NEGATIVE
LEUKOCYTES UA: NEGATIVE
Nitrite, UA: NEGATIVE
Protein, UA: NEGATIVE
Spec Grav, UA: 1.01 (ref 1.010–1.025)
Urobilinogen, UA: 0.2 E.U./dL
pH, UA: 6.5 (ref 5.0–8.0)

## 2018-07-07 NOTE — Progress Notes (Signed)
Date:  07/07/2018   Name:  Sheila Sandoval   DOB:  11/11/1945   MRN:  [id number]   Chief Complaint: Hip Pain (pain in left side  near hip and going down left leg.); Abdominal Pain (Has some lower abdominal and lower back pain x 1 week. ); and B12 Injection  Hip Pain   There was no injury mechanism. The pain is present in the left leg. The quality of the pain is described as aching. The pain has been improving since onset. Pertinent negatives include no inability to bear weight, loss of motion, numbness or tingling. The symptoms are aggravated by weight bearing (was on her feet cooking all day on Thanksgiving and pain started).  Abdominal Pain  This is a new problem. The pain is located in the RLQ. The pain is mild. The quality of the pain is colicky. Associated symptoms include arthralgias (left lateral hip and anterior hip) and constipation. Pertinent negatives include no dysuria, fever, headaches or hematuria.  Diabetes  She presents for her follow-up diabetic visit. She has type 2 diabetes mellitus. Her disease course has been worsening. Pertinent negatives for hypoglycemia include no headaches, nervousness/anxiousness or tremors. Pertinent negatives for diabetes include no chest pain, no fatigue, no polydipsia and no polyuria. Current diabetic treatments: 4 meds and A1C > 8. Her weight is stable.   Lab Results  Component Value Date   HGBA1C 8.1 (H) 04/07/2018    Review of Systems  Constitutional: Negative for appetite change, fatigue, fever and unexpected weight change.  HENT: Negative for tinnitus and trouble swallowing.   Eyes: Negative for visual disturbance.  Respiratory: Negative for cough, chest tightness and shortness of breath.   Cardiovascular: Negative for chest pain, palpitations and leg swelling.  Gastrointestinal: Positive for abdominal pain and constipation.  Endocrine: Negative for polydipsia and polyuria.  Genitourinary: Negative for dysuria and hematuria.    Musculoskeletal: Positive for arthralgias (left lateral hip and anterior hip).  Neurological: Negative for tingling, tremors, numbness and headaches.  Psychiatric/Behavioral: Negative for dysphoric mood and sleep disturbance. The patient is not nervous/anxious.     Patient Active Problem List   Diagnosis Date Noted  . Anatomical narrow angle glaucoma with borderline intraocular pressure 07/15/2017  . Vitamin D deficiency 03/11/2017  . Hypercalcemia 10/02/2016  . Diabetes mellitus type 2, controlled, with complications ([place]) 76/81/1572  . Pernicious anemia 04/11/2015  . Carpal tunnel syndrome 01/14/2015  . Hyperlipidemia associated with type 2 diabetes mellitus ([place]) 01/14/2015  . Essential hypertension 01/14/2015  . Climacteric 01/14/2015  . Muscle fatigue 01/14/2015  . Arthritis of hand, degenerative 01/14/2015  . GERD (gastroesophageal reflux disease) 10/29/2011  . Spondylolisthesis 10/29/2011    Allergies  Allergen Reactions  . Atorvastatin     chest tightness  . Tramadol Hcl Nausea And Vomiting    Past Surgical History:  Procedure Laterality Date  . ABDOMINAL HYSTERECTOMY  1981  . APPENDECTOMY  1981  . REPAIR DURAL / CSF LEAK  2002    Social History   Tobacco Use  . Smoking status: Never Smoker  . Smokeless tobacco: Never Used  . Tobacco comment: smoking cessation material not required  Substance Use Topics  . Alcohol use: No    Alcohol/week: 0.0 standard drinks  . Drug use: No     Medication list has been reviewed and updated.  Current Meds  Medication Sig  . amLODipine (NORVASC) 10 MG tablet TAKE 1 TABLET EVERY DAY  . ammonium lactate (AMLACTIN) 12 % cream Apply  topically as needed for dry skin.  Marland Kitchen aspirin 81 MG tablet Take 1 tablet by mouth daily.  . Blood Glucose Calibration (ACCU-CHEK AVIVA) SOLN 1 each by In Vitro route daily as needed.  . Blood Glucose Monitoring Suppl (ACCU-CHEK AVIVA PLUS) w/Device KIT USE TWICE DAILY  . Cholecalciferol 2000  units CAPS Take 1 capsule by mouth daily.  . diclofenac sodium (VOLTAREN) 1 % GEL APPLY 2 GRAMS TOPICALLY 4 TIMES DAILY.  Marland Kitchen estradiol (ESTRACE) 0.5 MG tablet Take 1 tablet (0.5 mg total) by mouth daily.  . Ferrous Gluconate 325 (36 FE) MG TABS Take by mouth.  . fluticasone (FLONASE) 50 MCG/ACT nasal spray Place 2 sprays into both nostrils daily.  Marland Kitchen glimepiride (AMARYL) 2 MG tablet TAKE 1 TABLET EVERY DAY WITH BREAKFAST  . glucose blood (ACCU-CHEK AVIVA PLUS) test strip 1 each by Other route 2 (two) times daily.  Marland Kitchen ibuprofen (ADVIL,MOTRIN) 800 MG tablet Take 1 tablet (800 mg total) by mouth 2 (two) times daily as needed.  Marland Kitchen JANUMET 50-1000 MG tablet TAKE 1 TABLET BY MOUTH TWICE DAILY WITH MEALS.  Marland Kitchen JARDIANCE 25 MG TABS tablet TAKE 1 TABLET EVERY DAY  . Lancet Devices (ACCU-CHEK SOFTCLIX) lancets 1 each by Other route 2 (two) times daily.  . meloxicam (MOBIC) 15 MG tablet TAKE 1 TABLET EVERY DAY  . ramipril (ALTACE) 5 MG capsule TAKE 1 CAPSULE EVERY DAY  . rosuvastatin (CRESTOR) 5 MG tablet TAKE 1 TABLET EVERY DAY  . sitaGLIPtin-metformin (JANUMET) 50-1000 MG tablet Take 1 tablet by mouth 2 (two) times daily with a meal.  . triamcinolone cream (KENALOG) 0.1 % Apply topically 2 (two) times daily.  Marland Kitchen triamterene-hydrochlorothiazide (MAXZIDE-25) 37.5-25 MG tablet TAKE 1 TABLET EVERY DAY   Current Facility-Administered Medications for the 07/07/18 encounter (Office Visit) with Glean Hess, MD  Medication  . cyanocobalamin ((VITAMIN B-12)) injection 1,000 mcg    PHQ 2/9 Scores 07/07/2018 02/01/2018 01/02/2018 11/30/2017  PHQ - 2 Score 0 2 2 2   PHQ- 9 Score - 4 4 4     Physical Exam Vitals signs and nursing note reviewed.  Constitutional:      General: She is not in acute distress.    Appearance: She is well-developed.  HENT:     Head: Normocephalic and atraumatic.  Cardiovascular:     Rate and Rhythm: Normal rate and regular rhythm.  Pulmonary:     Effort: Pulmonary effort is normal.  No respiratory distress.  Abdominal:     General: Abdomen is flat. Bowel sounds are normal. There is no distension.     Palpations: Abdomen is soft. There is no fluid wave.     Tenderness: There is no abdominal tenderness. There is no guarding or rebound.  Musculoskeletal: Normal range of motion.  Skin:    General: Skin is warm and dry.     Findings: No rash.  Neurological:     Mental Status: She is alert and oriented to person, place, and time.  Psychiatric:        Behavior: Behavior normal.        Thought Content: Thought content normal.     BP 122/78   Pulse 92   Resp 16   Ht 5' 2"  (1.575 m)   Wt 170 lb (77.1 kg)   SpO2 99%   BMI 31.09 kg/m   Assessment and Plan: 1. Controlled type 2 diabetes mellitus with complication, without long-term current use of insulin (HCC) A1C >8 last visit but pt has been working on  diet and exercise Would like to check labs again before adding an injection - Basic metabolic panel - Hemoglobin A1c  2. Lower abdominal pain UA negative, exam benign, pt reassured - POCT urinalysis dipstick  3. Pernicious anemia B12 given  4. Trochanteric bursitis of left hip Use ice, rubs, tylenol if needed No evidence of advanced OA   Partially dictated using Editor, commissioning. Any errors are unintentional.  Halina Maidens, MD [facility]  07/07/2018

## 2018-07-08 LAB — BASIC METABOLIC PANEL
BUN/Creatinine Ratio: 11 — ABNORMAL LOW (ref 12–28)
BUN: 11 mg/dL (ref 8–27)
CO2: 20 mmol/L (ref 20–29)
CREATININE: 1.01 mg/dL — AB (ref 0.57–1.00)
Calcium: 10.3 mg/dL (ref 8.7–10.3)
Chloride: 101 mmol/L (ref 96–106)
GFR, EST AFRICAN AMERICAN: 64 mL/min/{1.73_m2} (ref >59)
GFR, EST NON AFRICAN AMERICAN: 56 mL/min/{1.73_m2} — AB (ref >59)
Glucose: 211 mg/dL — ABNORMAL HIGH (ref 65–99)
POTASSIUM: 4.2 mmol/L (ref 3.5–5.2)
SODIUM: 141 mmol/L (ref 134–144)

## 2018-07-08 LAB — HEMOGLOBIN A1C
ESTIMATED AVERAGE GLUCOSE: 194 mg/dL
Hgb A1c MFr Bld: 8.4 % — ABNORMAL HIGH (ref 4.8–5.6)

## 2018-07-17 ENCOUNTER — Other Ambulatory Visit: Payer: Self-pay | Admitting: Internal Medicine

## 2018-07-21 ENCOUNTER — Other Ambulatory Visit: Payer: Self-pay | Admitting: Internal Medicine

## 2018-07-21 DIAGNOSIS — R21 Rash and other nonspecific skin eruption: Secondary | ICD-10-CM

## 2018-07-25 ENCOUNTER — Ambulatory Visit (INDEPENDENT_AMBULATORY_CARE_PROVIDER_SITE_OTHER): Payer: Medicare PPO | Admitting: Internal Medicine

## 2018-07-25 ENCOUNTER — Encounter: Payer: Self-pay | Admitting: Internal Medicine

## 2018-07-25 VITALS — BP 128/76 | HR 81 | Ht 62.0 in | Wt 165.2 lb

## 2018-07-25 DIAGNOSIS — E118 Type 2 diabetes mellitus with unspecified complications: Secondary | ICD-10-CM

## 2018-07-25 NOTE — Patient Instructions (Signed)
Morning blood sugar (before eating) should be less than 140 - ideally less than 120.  Random blood sugar should be less than 160.

## 2018-07-25 NOTE — Progress Notes (Signed)
Date:  07/25/2018   Name:  Sheila Sandoval   DOB:  10/22/1945   MRN:  [id number]   Chief Complaint: Diabetes (Lost 8 lbs :))  Diabetes  She presents for her follow-up diabetic visit. She has type 2 diabetes mellitus. Her disease course has been worsening. Pertinent negatives for hypoglycemia include no headaches or tremors. Pertinent negatives for diabetes include no chest pain, no fatigue, no polydipsia and no polyuria. Symptoms are worsening.  She was scheduled today to start Trulicity injections.  However, she is very reluctant to add more medications so began working on her diet in [place].  She admits she was not doing as she should.  She is exercising at home on a stationary bike.  She has lost 5 lbs. Lab Results  Component Value Date   HGBA1C 8.4 (H) 07/07/2018    Review of Systems  Constitutional: Negative for appetite change, fatigue, fever and unexpected weight change (has lost 5 lbs since last visit).  HENT: Negative for tinnitus and trouble swallowing.   Eyes: Negative for visual disturbance.  Respiratory: Negative for cough, chest tightness and shortness of breath.   Cardiovascular: Negative for chest pain, palpitations and leg swelling.  Gastrointestinal: Negative for abdominal pain.  Endocrine: Negative for polydipsia and polyuria.  Genitourinary: Negative for dysuria and hematuria.  Musculoskeletal: Negative for arthralgias.  Neurological: Negative for tremors, numbness and headaches.  Psychiatric/Behavioral: Negative for dysphoric mood.    Patient Active Problem List   Diagnosis Date Noted  . Controlled type 2 diabetes mellitus with complication, without long-term current use of insulin ([place]) 07/07/2018  . Anatomical narrow angle glaucoma with borderline intraocular pressure 07/15/2017  . Vitamin D deficiency 03/11/2017  . Hypercalcemia 10/02/2016  . Pernicious anemia 04/11/2015  . Carpal tunnel syndrome 01/14/2015  . Hyperlipidemia associated with type 2  diabetes mellitus ([place]) 01/14/2015  . Essential hypertension 01/14/2015  . Climacteric 01/14/2015  . Muscle fatigue 01/14/2015  . Arthritis of hand, degenerative 01/14/2015  . GERD (gastroesophageal reflux disease) 10/29/2011  . Spondylolisthesis 10/29/2011    Allergies  Allergen Reactions  . Atorvastatin     chest tightness  . Tramadol Hcl Nausea And Vomiting    Past Surgical History:  Procedure Laterality Date  . ABDOMINAL HYSTERECTOMY  1981  . APPENDECTOMY  1981  . REPAIR DURAL / CSF LEAK  2002    Social History   Tobacco Use  . Smoking status: Never Smoker  . Smokeless tobacco: Never Used  . Tobacco comment: smoking cessation material not required  Substance Use Topics  . Alcohol use: No    Alcohol/week: 0.0 standard drinks  . Drug use: No     Medication list has been reviewed and updated.  Current Meds  Medication Sig  . amLODipine (NORVASC) 10 MG tablet TAKE 1 TABLET EVERY DAY  . ammonium lactate (AMLACTIN) 12 % cream Apply topically as needed for dry skin.  Marland Kitchen aspirin 81 MG tablet Take 1 tablet by mouth daily.  . Blood Glucose Calibration (ACCU-CHEK AVIVA) SOLN 1 each by In Vitro route daily as needed.  . Blood Glucose Monitoring Suppl (ACCU-CHEK AVIVA PLUS) w/Device KIT USE TWICE DAILY  . Cholecalciferol 2000 units CAPS Take 1 capsule by mouth daily.  . diclofenac sodium (VOLTAREN) 1 % GEL APPLY 2 GRAMS TOPICALLY 4 TIMES DAILY.  Marland Kitchen estradiol (ESTRACE) 0.5 MG tablet Take 1 tablet (0.5 mg total) by mouth daily. (Patient taking differently: Take 0.5 mg by mouth daily. 1 tablet every other day)  .  ezetimibe (ZETIA) 10 MG tablet TAKE 1 TABLET EVERY DAY  . Ferrous Gluconate 325 (36 FE) MG TABS Take by mouth.  . fluticasone (FLONASE) 50 MCG/ACT nasal spray Place 2 sprays into both nostrils daily.  Marland Kitchen glimepiride (AMARYL) 2 MG tablet TAKE 1 TABLET EVERY DAY WITH BREAKFAST  . glucose blood (ACCU-CHEK AVIVA PLUS) test strip 1 each by Other route 2 (two) times daily.  Marland Kitchen  ibuprofen (ADVIL,MOTRIN) 800 MG tablet Take 1 tablet (800 mg total) by mouth 2 (two) times daily as needed.  Marland Kitchen JANUMET 50-1000 MG tablet TAKE 1 TABLET BY MOUTH TWICE DAILY WITH MEALS.  Marland Kitchen JARDIANCE 25 MG TABS tablet TAKE 1 TABLET EVERY DAY  . Lancet Devices (ACCU-CHEK SOFTCLIX) lancets 1 each by Other route 2 (two) times daily.  . meloxicam (MOBIC) 15 MG tablet TAKE 1 TABLET EVERY DAY  . ramipril (ALTACE) 5 MG capsule TAKE 1 CAPSULE EVERY DAY  . rosuvastatin (CRESTOR) 5 MG tablet TAKE 1 TABLET EVERY DAY  . triamcinolone cream (KENALOG) 0.1 % APPLY TOPICALLY 2 (TWO) TIMES DAILY.  Marland Kitchen triamterene-hydrochlorothiazide (MAXZIDE-25) 37.5-25 MG tablet TAKE 1 TABLET EVERY DAY   Current Facility-Administered Medications for the 07/25/18 encounter (Office Visit) with Glean Hess, MD  Medication  . cyanocobalamin ((VITAMIN B-12)) injection 1,000 mcg    PHQ 2/9 Scores 07/25/2018 07/07/2018 02/01/2018 01/02/2018  PHQ - 2 Score 0 0 2 2  PHQ- 9 Score - - 4 4   Wt Readings from Last 3 Encounters:  07/25/18 165 lb 3.2 oz (74.9 kg)  07/07/18 170 lb (77.1 kg)  04/07/18 172 lb (78 kg)    Physical Exam Vitals signs and nursing note reviewed.  Constitutional:      General: She is not in acute distress.    Appearance: She is well-developed.  HENT:     Head: Normocephalic and atraumatic.  Neck:     Musculoskeletal: Normal range of motion and neck supple.  Cardiovascular:     Rate and Rhythm: Normal rate and regular rhythm.  Pulmonary:     Effort: Pulmonary effort is normal. No respiratory distress.     Breath sounds: Normal breath sounds.  Musculoskeletal: Normal range of motion.  Skin:    General: Skin is warm and dry.     Findings: No rash.  Neurological:     Mental Status: She is alert and oriented to person, place, and time.  Psychiatric:        Behavior: Behavior normal.        Thought Content: Thought content normal.     BP 128/76 (BP Location: Right Arm, Patient Position: Sitting,  Cuff Size: Normal)   Pulse 81   Ht 5' 2"  (1.575 m)   Wt 165 lb 3.2 oz (74.9 kg)   SpO2 98%   BMI 30.22 kg/m   Assessment and Plan: 1. Controlled type 2 diabetes mellitus with complication, without long-term current use of insulin ([place]) Pt wishes to work on diet and weight loss before starting another medication Will continue current therapy, aim for 10 lb weight loss by next visit in April   Partially dictated using Editor, commissioning. Any errors are unintentional.  Halina Maidens, MD [facility]  07/25/2018

## 2018-08-03 ENCOUNTER — Other Ambulatory Visit: Payer: Self-pay | Admitting: Internal Medicine

## 2018-08-17 ENCOUNTER — Telehealth: Payer: Self-pay

## 2018-08-17 NOTE — Telephone Encounter (Signed)
Patient called saying she is having R) ear fullness. Sheila Sandoval it feels closed up but won't clear back up. She has tried using a Q-TIP 2X and no relief. Said she is unsure if its wax build up or pressure. Told her to try Sinus pressure medication OTC. If no better then try to make OV to come in and see Dr Army Melia.  She verbalized understanding of this.

## 2018-08-18 ENCOUNTER — Other Ambulatory Visit: Payer: Self-pay

## 2018-08-18 DIAGNOSIS — H938X1 Other specified disorders of right ear: Secondary | ICD-10-CM

## 2018-08-23 DIAGNOSIS — I10 Essential (primary) hypertension: Secondary | ICD-10-CM | POA: Diagnosis not present

## 2018-08-23 DIAGNOSIS — E785 Hyperlipidemia, unspecified: Secondary | ICD-10-CM | POA: Diagnosis not present

## 2018-08-23 DIAGNOSIS — K219 Gastro-esophageal reflux disease without esophagitis: Secondary | ICD-10-CM | POA: Diagnosis not present

## 2018-08-23 DIAGNOSIS — H6123 Impacted cerumen, bilateral: Secondary | ICD-10-CM | POA: Diagnosis not present

## 2018-08-23 DIAGNOSIS — E119 Type 2 diabetes mellitus without complications: Secondary | ICD-10-CM | POA: Diagnosis not present

## 2018-08-31 ENCOUNTER — Other Ambulatory Visit: Payer: Self-pay | Admitting: Internal Medicine

## 2018-08-31 ENCOUNTER — Telehealth: Payer: Self-pay

## 2018-08-31 DIAGNOSIS — H938X1 Other specified disorders of right ear: Secondary | ICD-10-CM

## 2018-08-31 NOTE — Telephone Encounter (Signed)
Patient wants new Referral to ENT [place]. Ear fullness. She said [facility] ER Flushed Ear but it is not better.They referred her to [facility] but they are to far away for her. Not sure any doctor names but said it is an ENT on [address].

## 2018-09-08 ENCOUNTER — Other Ambulatory Visit: Payer: Self-pay | Admitting: Internal Medicine

## 2018-09-08 DIAGNOSIS — R21 Rash and other nonspecific skin eruption: Secondary | ICD-10-CM

## 2018-09-15 ENCOUNTER — Other Ambulatory Visit: Payer: Self-pay | Admitting: Internal Medicine

## 2018-10-05 ENCOUNTER — Other Ambulatory Visit: Payer: Self-pay | Admitting: Internal Medicine

## 2018-10-05 DIAGNOSIS — E782 Mixed hyperlipidemia: Secondary | ICD-10-CM

## 2018-10-23 ENCOUNTER — Other Ambulatory Visit: Payer: Self-pay | Admitting: Internal Medicine

## 2018-11-03 ENCOUNTER — Encounter: Payer: Medicare PPO | Admitting: Internal Medicine

## 2018-11-14 ENCOUNTER — Other Ambulatory Visit: Payer: Self-pay

## 2018-11-14 MED ORDER — FLUTICASONE PROPIONATE 50 MCG/ACT NA SUSP: 2.0000 | Freq: Every day | NASAL | 3 refills | Status: DC

## 2018-11-22 ENCOUNTER — Other Ambulatory Visit: Payer: Self-pay

## 2018-11-28 ENCOUNTER — Other Ambulatory Visit: Payer: Self-pay | Admitting: Internal Medicine

## 2018-11-28 DIAGNOSIS — M7062 Trochanteric bursitis, left hip: Secondary | ICD-10-CM

## 2018-11-28 MED ORDER — DICLOFENAC SODIUM 1 % TD GEL: 2.0000 g | Freq: Four times a day (QID) | TRANSDERMAL | 3 refills | Status: DC

## 2018-12-06 ENCOUNTER — Ambulatory Visit (INDEPENDENT_AMBULATORY_CARE_PROVIDER_SITE_OTHER): Payer: Medicare PPO

## 2018-12-06 VITALS — Ht 62.0 in | Wt 164.0 lb

## 2018-12-06 DIAGNOSIS — Z0000 Encounter for general adult medical examination without abnormal findings: Secondary | ICD-10-CM

## 2018-12-06 DIAGNOSIS — Z598 Other problems related to housing and economic circumstances: Secondary | ICD-10-CM | POA: Diagnosis not present

## 2018-12-06 DIAGNOSIS — Z599 Problem related to housing and economic circumstances, unspecified: Secondary | ICD-10-CM

## 2018-12-06 NOTE — Patient Instructions (Signed)
Sheila Sandoval , Thank you for taking time to come for your Medicare Wellness Visit. I appreciate your ongoing commitment to your health goals. Please review the following plan we discussed and let me know if I can assist you in the future.   Screening recommendations/referrals: Colonoscopy: done 09/09/15. Repeat in 2027. Mammogram: done 07/07/18. Repeat every year.  Bone Density: done 05/27/17. Repeat in 2021. Recommended yearly ophthalmology/optometry visit for glaucoma screening and checkup Recommended yearly dental visit for hygiene and checkup  Vaccinations: Influenza vaccine: done 04/07/18 Pneumococcal vaccine: done 09/17/16 Tdap vaccine: done 09/04/10 Shingles vaccine: done 01/05/18    Advanced directives: Advance directive discussed with you today. I have mailed a copy for you to complete at home and have notarized. Once this is complete please bring a copy in to our office so we can scan it into your chart.  Conditions/risks identified: Recommend healthy eating and physical activity to lower A1c   Next appointment: Please follow up in one year for your Medicare Annual Wellness visit.     Preventive Care 26 Years and Older, Female Preventive care refers to lifestyle choices and visits with your health care provider that can promote health and wellness. What does preventive care include?  A yearly physical exam. This is also called an annual well check.  Dental exams once or twice a year.  Routine eye exams. Ask your health care provider how often you should have your eyes checked.  Personal lifestyle choices, including:  Daily care of your teeth and gums.  Regular physical activity.  Eating a healthy diet.  Avoiding tobacco and drug use.  Limiting alcohol use.  Practicing safe sex.  Taking low-dose aspirin every day.  Taking vitamin and mineral supplements as recommended by your health care provider. What happens during an annual well check? The services and  screenings done by your health care provider during your annual well check will depend on your age, overall health, lifestyle risk factors, and family history of disease. Counseling  Your health care provider may ask you questions about your:  Alcohol use.  Tobacco use.  Drug use.  Emotional well-being.  Home and relationship well-being.  Sexual activity.  Eating habits.  History of falls.  Memory and ability to understand (cognition).  Work and work Statistician.  Reproductive health. Screening  You may have the following tests or measurements:  Height, weight, and BMI.  Blood pressure.  Lipid and cholesterol levels. These may be checked every 5 years, or more frequently if you are over 48 years old.  Skin check.  Lung cancer screening. You may have this screening every year starting at age 64 if you have a 30-pack-year history of smoking and currently smoke or have quit within the past 15 years.  Fecal occult blood test (FOBT) of the stool. You may have this test every year starting at age 70.  Flexible sigmoidoscopy or colonoscopy. You may have a sigmoidoscopy every 5 years or a colonoscopy every 10 years starting at age 30.  Hepatitis C blood test.  Hepatitis B blood test.  Sexually transmitted disease (STD) testing.  Diabetes screening. This is done by checking your blood sugar (glucose) after you have not eaten for a while (fasting). You may have this done every 1-3 years.  Bone density scan. This is done to screen for osteoporosis. You may have this done starting at age 39.  Mammogram. This may be done every 1-2 years. Talk to your health care provider about how often you should  have regular mammograms. Talk with your health care provider about your test results, treatment options, and if necessary, the need for more tests. Vaccines  Your health care provider may recommend certain vaccines, such as:  Influenza vaccine. This is recommended every year.   Tetanus, diphtheria, and acellular pertussis (Tdap, Td) vaccine. You may need a Td booster every 10 years.  Zoster vaccine. You may need this after age 36.  Pneumococcal 13-valent conjugate (PCV13) vaccine. One dose is recommended after age 60.  Pneumococcal polysaccharide (PPSV23) vaccine. One dose is recommended after age 49. Talk to your health care provider about which screenings and vaccines you need and how often you need them. This information is not intended to replace advice given to you by your health care provider. Make sure you discuss any questions you have with your health care provider. Document Released: 08/08/2015 Document Revised: 03/31/2016 Document Reviewed: 05/13/2015 Elsevier Interactive Patient Education  2017 [place] Prevention in the Home Falls can cause injuries. They can happen to people of all ages. There are many things you can do to make your home safe and to help prevent falls. What can I do on the outside of my home?  Regularly fix the edges of walkways and driveways and fix any cracks.  Remove anything that might make you trip as you walk through a door, such as a raised step or threshold.  Trim any bushes or trees on the path to your home.  Use bright outdoor lighting.  Clear any walking paths of anything that might make someone trip, such as rocks or tools.  Regularly check to see if handrails are loose or broken. Make sure that both sides of any steps have handrails.  Any raised decks and porches should have guardrails on the edges.  Have any leaves, snow, or ice cleared regularly.  Use sand or salt on walking paths during winter.  Clean up any spills in your garage right away. This includes oil or grease spills. What can I do in the bathroom?  Use night lights.  Install grab bars by the toilet and in the tub and shower. Do not use towel bars as grab bars.  Use non-skid mats or decals in the tub or shower.  If you need to sit  down in the shower, use a plastic, non-slip stool.  Keep the floor dry. Clean up any water that spills on the floor as soon as it happens.  Remove soap buildup in the tub or shower regularly.  Attach bath mats securely with double-sided non-slip rug tape.  Do not have throw rugs and other things on the floor that can make you trip. What can I do in the bedroom?  Use night lights.  Make sure that you have a light by your bed that is easy to reach.  Do not use any sheets or blankets that are too big for your bed. They should not hang down onto the floor.  Have a firm chair that has side arms. You can use this for support while you get dressed.  Do not have throw rugs and other things on the floor that can make you trip. What can I do in the kitchen?  Clean up any spills right away.  Avoid walking on wet floors.  Keep items that you use a lot in easy-to-reach places.  If you need to reach something above you, use a strong step stool that has a grab bar.  Keep electrical cords out of  the way.  Do not use floor polish or wax that makes floors slippery. If you must use wax, use non-skid floor wax.  Do not have throw rugs and other things on the floor that can make you trip. What can I do with my stairs?  Do not leave any items on the stairs.  Make sure that there are handrails on both sides of the stairs and use them. Fix handrails that are broken or loose. Make sure that handrails are as long as the stairways.  Check any carpeting to make sure that it is firmly attached to the stairs. Fix any carpet that is loose or worn.  Avoid having throw rugs at the top or bottom of the stairs. If you do have throw rugs, attach them to the floor with carpet tape.  Make sure that you have a light switch at the top of the stairs and the bottom of the stairs. If you do not have them, ask someone to add them for you. What else can I do to help prevent falls?  Wear shoes that:  Do not have  high heels.  Have rubber bottoms.  Are comfortable and fit you well.  Are closed at the toe. Do not wear sandals.  If you use a stepladder:  Make sure that it is fully opened. Do not climb a closed stepladder.  Make sure that both sides of the stepladder are locked into place.  Ask someone to hold it for you, if possible.  Clearly mark and make sure that you can see:  Any grab bars or handrails.  First and last steps.  Where the edge of each step is.  Use tools that help you move around (mobility aids) if they are needed. These include:  Canes.  Walkers.  Scooters.  Crutches.  Turn on the lights when you go into a dark area. Replace any light bulbs as soon as they burn out.  Set up your furniture so you have a clear path. Avoid moving your furniture around.  If any of your floors are uneven, fix them.  If there are any pets around you, be aware of where they are.  Review your medicines with your doctor. Some medicines can make you feel dizzy. This can increase your chance of falling. Ask your doctor what other things that you can do to help prevent falls. This information is not intended to replace advice given to you by your health care provider. Make sure you discuss any questions you have with your health care provider. Document Released: 05/08/2009 Document Revised: 12/18/2015 Document Reviewed: 08/16/2014 Elsevier Interactive Patient Education  2017 [employer].

## 2018-12-06 NOTE — Progress Notes (Addendum)
Subjective:   Sheila Sandoval is a 73 y.o. female who presents for Medicare Annual (Subsequent) preventive examination.  Virtual Visit via Telephone Note  I connected with Sheila Sandoval on 12/06/18 at 10:00 AM EDT by telephone and verified that I am speaking with the correct person using two identifiers.  Medical Annual Wellness visit completed telephonically due to Covid-19 pandemic.   Location: Patient: home Provider: office   I discussed the limitations, risks, security and privacy concerns of performing an evaluation and management service by telephone and the availability of in person appointments. The patient expressed understanding and agreed to proceed.  Some vital signs may be absent or patient reported. Patient did not have a blood pressure monitor at home to check blood pressure.   Clemetine Marker, LPN  Review of Systems:   Cardiac Risk Factors include: advanced age (>93mn, >>52women);diabetes mellitus;dyslipidemia;hypertension;obesity (BMI >30kg/m2)     Objective:     Vitals: Ht _0  (1.575 m)   Wt 164 lb (74.4 kg)   BMI 30.00 kg/m   Body mass index is 30 kg/m.  Advanced Directives 12/06/2018 01/02/2018 11/30/2017 11/05/2016 11/05/2016 10/23/2015  Does Patient Have a Medical Advance Directive? _1  No  Would patient like information on creating a medical advance directive? Yes (MAU/Ambulatory/Procedural Areas - Information given) Yes (MAU/Ambulatory/Procedural Areas - Information given) Yes (MAU/Ambulatory/Procedural Areas - Information given) - - No - patient declined information    Tobacco Social History   Tobacco Use  Smoking Status Never Smoker  Smokeless Tobacco Never Used  Tobacco Comment   smoking cessation material not required     Counseling given: Not Answered Comment: smoking cessation material not required   Clinical Intake:  Pre-visit preparation completed: Yes  Pain : No/denies pain     Nutritional Risks: None  Diabetes: Yes CBG done?: No Did pt. bring in CBG monitor from home?: No   Nutrition Risk Assessment:  Has the patient had any N/V/D within the last 2 months?  No  Does the patient have any non-healing wounds?  No  Has the patient had any unintentional weight loss or weight gain?  No   Diabetes:  Is the patient diabetic?  Yes  If diabetic, was a CBG obtained today?  No  Did the patient bring in their glucometer from home?  No  How often do you monitor your CBG's? Once a month or as needed.   Financial Strains and Diabetes Management:  Are you having any financial strains with the device, your supplies or your medication? No .  Does the patient want to be seen by Chronic Care Management for management of their diabetes?  No  Would the patient like to be referred to a Nutritionist or for Diabetic Management?  No   Diabetic Exams:  Diabetic Eye Exam: Completed 12/16/17 negative retinopathy.   Diabetic Foot Exam: Completed 10/26/17. Pt has been advised about the importance in completing this exam. Pt is scheduled for diabetic foot exam on 12/22/18.   How often do you need to have someone help you when you read instructions, pamphlets, or other written materials from your doctor or pharmacy?: 1 - Never  Interpreter Needed?: No  Information entered by :: KClemetine MarkerLPN  Past Medical History:  Diagnosis Date  . Arthritis   . B12 deficiency   . Diabetes mellitus without complication ([place]   . GERD (gastroesophageal reflux disease)   . Hypercalcemia   . Hyperlipidemia   . Hypertension   .  Vitamin D deficiency    Past Surgical History:  Procedure Laterality Date  . ABDOMINAL HYSTERECTOMY  1981  . APPENDECTOMY  1981  . REPAIR DURAL / CSF LEAK  2002   Family History  Problem Relation Age of Onset  . Diabetes Mother   . Cancer Father        prostate  . Breast cancer Sister   . Cancer Brother        prostate  . Other Sister        fall   Social History   Socioeconomic  History  . Marital status: Widowed    Spouse name: Not on file  . Number of children: 3  . Years of education: Not on file  . Highest education level: 11th grade  Occupational History  . Occupation: Retired  Scientific laboratory technician  . Financial resource strain: Hard  . Food insecurity:    Worry: Sometimes true    Inability: Sometimes true  . Transportation needs:    Medical: No    Non-medical: No  Tobacco Use  . Smoking status: Never Smoker  . Smokeless tobacco: Never Used  . Tobacco comment: smoking cessation material not required  Substance and Sexual Activity  . Alcohol use: No    Alcohol/week: 0.0 standard drinks  . Drug use: No  . Sexual activity: Not Currently  Lifestyle  . Physical activity:    Days per week: 7 days    Minutes per session: 20 min  . Stress: Not at all  Relationships  . Social connections:    Talks on phone: More than three times a week    Gets together: More than three times a week    Attends religious service: More than 4 times per year    Active member of club or organization: Yes    Attends meetings of clubs or organizations: More than 4 times per year    Relationship status: Widowed  Other Topics Concern  . Not on file  Social History Narrative  . Not on file    Outpatient Encounter Medications as of 12/06/2018  Medication Sig  . amLODipine (NORVASC) 10 MG tablet TAKE 1 TABLET EVERY DAY  . ammonium lactate (AMLACTIN) 12 % cream APPLY TOPICALLY AS NEEDED FOR DRY SKIN.  Marland Kitchen aspirin 81 MG tablet Take 1 tablet by mouth daily.  . Blood Glucose Monitoring Suppl (ACCU-CHEK AVIVA PLUS) w/Device KIT USE TWICE DAILY  . Cholecalciferol 2000 units CAPS Take 1 capsule by mouth daily.  . diclofenac sodium (VOLTAREN) 1 % GEL Apply 2 g topically 4 (four) times daily.  Marland Kitchen estradiol (ESTRACE) 0.5 MG tablet Take 1 tablet (0.5 mg total) by mouth daily. (Patient taking differently: Take 0.5 mg by mouth daily. 1 tablet every other day)  . ezetimibe (ZETIA) 10 MG tablet  TAKE 1 TABLET EVERY DAY  . Ferrous Gluconate 325 (36 FE) MG TABS Take by mouth.  . fluticasone (FLONASE) 50 MCG/ACT nasal spray Place 2 sprays into both nostrils daily.  Marland Kitchen glimepiride (AMARYL) 2 MG tablet TAKE 1 TABLET EVERY DAY WITH BREAKFAST  . glucose blood (ACCU-CHEK AVIVA PLUS) test strip 1 each by Other route 2 (two) times daily.  Marland Kitchen ibuprofen (ADVIL,MOTRIN) 800 MG tablet Take 1 tablet (800 mg total) by mouth 2 (two) times daily as needed.  Marland Kitchen JANUMET 50-1000 MG tablet TAKE 1 TABLET BY MOUTH TWICE DAILY WITH MEALS.  Marland Kitchen JARDIANCE 25 MG TABS tablet TAKE 1 TABLET EVERY DAY  . Lancet Devices (ACCU-CHEK SOFTCLIX) lancets 1  each by Other route 2 (two) times daily.  . meloxicam (MOBIC) 15 MG tablet TAKE 1 TABLET EVERY DAY  . ramipril (ALTACE) 5 MG capsule TAKE 1 CAPSULE EVERY DAY  . rosuvastatin (CRESTOR) 5 MG tablet TAKE 1 TABLET EVERY DAY  . triamcinolone cream (KENALOG) 0.1 % APPLY TOPICALLY 2 (TWO) TIMES DAILY.  Marland Kitchen triamterene-hydrochlorothiazide (MAXZIDE-25) 37.5-25 MG tablet TAKE 1 TABLET EVERY DAY  . [DISCONTINUED] Blood Glucose Calibration (ACCU-CHEK AVIVA) SOLN 1 each by In Vitro route daily as needed.   Facility-Administered Encounter Medications as of 12/06/2018  Medication  . cyanocobalamin ((VITAMIN B-12)) injection 1,000 mcg    Activities of Daily Living In your present state of health, do you have any difficulty performing the following activities: 12/06/2018 02/01/2018  Hearing? N N  Comment declines hearing aids -  Vision? N N  Comment wears glasses -  Difficulty concentrating or making decisions? N N  Walking or climbing stairs? N N  Dressing or bathing? N N  Doing errands, shopping? N N  Preparing Food and eating ? N -  Using the Toilet? N -  In the past six months, have you accidently leaked urine? N -  Do you have problems with loss of bowel control? N -  Managing your Medications? N -  Managing your Finances? N -  Housekeeping or managing your Housekeeping? N -   Some recent data might be hidden    Patient Care Team: Glean Hess, MD as PCP - General (Internal Medicine) Sherie Don, MD as Referring Physician (Ophthalmology) Dr Johnathan Hausen (Dermatology)    Assessment:   This is a routine wellness examination for Sheila Sandoval.  Exercise Activities and Dietary recommendations Current Exercise Habits: Home exercise routine, Type of exercise: walking;Other - see comments(exercise bike), Time (Minutes): 20, Frequency (Times/Week): 7, Weekly Exercise (Minutes/Week): 140, Intensity: Mild, Exercise limited by: None identified  Goals    . DIET - INCREASE WATER INTAKE     Recommend to drink at least 6-8 8oz glasses of water per day.    . Patient Stated     Patient states she would like to maintain healthy weight and lower A1c    . Prevent falls     Recommend to remove any items from the home that may cause slips or trips. Also recommend to add a non-slid pad to her shower to reduce risk for fall.        Fall Risk Fall Risk  12/06/2018 07/25/2018 07/07/2018 02/01/2018 01/02/2018  Falls in the past year? 1 0 0 Yes Yes  Comment - - - - -  Number falls in past yr: 0 0 0 2 or more 2 or more  Injury with Fall? 0 0 0 Yes Yes  Comment - - - - bruising from hitting head  Risk Factor Category  - - - High Fall Risk High Fall Risk  Comment - - - - -  Risk for fall due to : - - - History of fall(s);Impaired balance/gait;Impaired mobility History of fall(s)  Risk for fall due to: Comment - - - - -  Follow up Falls prevention discussed Falls evaluation completed - Falls evaluation completed;Education provided;Falls prevention discussed Falls evaluation completed;Falls prevention discussed;Education provided  FALL RISK PREVENTION PERTAINING TO THE HOME:  Any stairs in or around the home? No  If so, do they handrails? No   Home free of loose throw rugs in walkways, pet beds, electrical cords, etc? Yes  Adequate lighting in your home to reduce  risk of falls? Yes   ASSISTIVE DEVICES UTILIZED TO PREVENT FALLS:  Life alert? No  Use of a cane, walker or w/c? No  Grab bars in the bathroom? Yes  Shower chair or bench in shower? Yes  Elevated toilet seat or a handicapped toilet? Yes   DME ORDERS:  DME order needed?  No   TIMED UP AND GO:  Was the test performed? No . Telephonic visit.   Education: Fall risk prevention has been discussed.  Intervention(s) required? No   Depression Screen PHQ 2/9 Scores 12/06/2018 07/25/2018 07/07/2018 02/01/2018  PHQ - 2 Score 0 0 0 2  PHQ- 9 Score - - - 4     Cognitive Function     6CIT Screen 12/06/2018 11/30/2017 11/05/2016  What Year? 0 points 0 points 0 points  What month? 0 points 0 points 0 points  What time? 0 points 0 points 0 points  Count back from 20 0 points 0 points 0 points  Months in reverse 0 points 4 points 0 points  Repeat phrase 2 points 4 points 0 points  Total Score 2 8 0    Immunization History  Administered Date(s) Administered  . Influenza, High Dose Seasonal PF 04/07/2018  . Influenza,inj,Quad PF,6+ Mos 07/25/2013, 04/11/2015, 06/04/2016, 08/09/2016, 05/04/2017  . Influenza-Unspecified 08/20/2014  . Pneumococcal Conjugate-13 08/02/2014  . Pneumococcal Polysaccharide-23 09/04/2010, 09/17/2016  . Pneumococcal-Unspecified 07/26/2000, 09/04/2010  . Tdap 09/04/2010  . Zoster Recombinat (Shingrix) 10/13/2017, 01/05/2018    Qualifies for Shingles Vaccine? Yes  Shingrix series completed.   Tdap: Up to date  Flu Vaccine: Up to date  Pneumococcal Vaccine: Up to date   Screening Tests Health Maintenance  Topic Date Due  . FOOT EXAM  10/27/2018  . OPHTHALMOLOGY EXAM  12/17/2018  . HEMOGLOBIN A1C  01/06/2019  . INFLUENZA VACCINE  02/24/2019  . MAMMOGRAM  07/08/2019  . TETANUS/TDAP  09/04/2020  . COLONOSCOPY  09/08/2025  . DEXA SCAN  Completed  . Hepatitis C Screening  Completed  . PNA vac Low Risk Adult  Completed    Cancer Screenings:   Colorectal Screening: Completed 09/09/15. Repeat every 10 years.  Mammogram: Completed 07/07/18. Repeat every year.   Bone Density: Completed 05/27/17. Results reflect NORMAL. Repeat every 2 years.   Lung Cancer Screening: (Low Dose CT Chest recommended if Age 2-80 years, 30 pack-year currently smoking OR have quit w/in 15years.) does not qualify.   Additional Screening:  Hepatitis C Screening: does qualify; Completed 11/05/16.  Vision Screening: Recommended annual ophthalmology exams for early detection of glaucoma and other disorders of the eye. Is the patient up to date with their annual eye exam?  Yes  Who is the provider or what is the name of the office in which the pt attends annual eye exams?  [facility]  Dental Screening: Recommended annual dental exams for proper oral hygiene  Community Resource Referral:  CRR required this visit?  No      Plan:     I have personally reviewed and addressed the Medicare Annual Wellness questionnaire and have noted the following in the patient's chart:  A. Medical and social history B. Use of alcohol, tobacco or illicit drugs  C. Current medications and supplements D. Functional ability and status E.  Nutritional status F.  Physical activity G. Advance directives H. List of other physicians I.  Hospitalizations, surgeries, and ER visits in previous 12 months J.  [place] such as hearing and vision if needed, cognitive and depression L. Referrals  and appointments   In addition, I have reviewed and discussed with patient certain preventive protocols, quality metrics, and best practice recommendations. A written personalized care plan for preventive services as well as general preventive health recommendations were provided to patient.   Signed,  Clemetine Marker, LPN Nurse Health Advisor   Nurse Notes: C3 referral sent for community resources for assistance. Pt states she is late paying some of her bills and has a hard time with  food.

## 2018-12-07 ENCOUNTER — Telehealth: Payer: Self-pay

## 2018-12-07 NOTE — Telephone Encounter (Signed)
12/07/2018 Spoke with [name] about [place] Tax Relief Program for Elderly residents, Low Income Energy Assistance, Crisis Intervention Program and local food banks. Also emailed [employer] a copy of resources to be mailed to [name]. Will follow-up with her in a few days. MA

## 2018-12-09 DIAGNOSIS — I739 Peripheral vascular disease, unspecified: Secondary | ICD-10-CM | POA: Diagnosis not present

## 2018-12-09 DIAGNOSIS — E669 Obesity, unspecified: Secondary | ICD-10-CM | POA: Diagnosis not present

## 2018-12-09 DIAGNOSIS — N182 Chronic kidney disease, stage 2 (mild): Secondary | ICD-10-CM | POA: Diagnosis not present

## 2018-12-09 DIAGNOSIS — E559 Vitamin D deficiency, unspecified: Secondary | ICD-10-CM | POA: Diagnosis not present

## 2018-12-09 DIAGNOSIS — R21 Rash and other nonspecific skin eruption: Secondary | ICD-10-CM | POA: Diagnosis not present

## 2018-12-09 DIAGNOSIS — Z6830 Body mass index (BMI) 30.0-30.9, adult: Secondary | ICD-10-CM | POA: Diagnosis not present

## 2018-12-09 DIAGNOSIS — E11319 Type 2 diabetes mellitus with unspecified diabetic retinopathy without macular edema: Secondary | ICD-10-CM | POA: Diagnosis not present

## 2018-12-09 DIAGNOSIS — D631 Anemia in chronic kidney disease: Secondary | ICD-10-CM | POA: Diagnosis not present

## 2018-12-09 DIAGNOSIS — M158 Other polyosteoarthritis: Secondary | ICD-10-CM | POA: Diagnosis not present

## 2018-12-09 DIAGNOSIS — D510 Vitamin B12 deficiency anemia due to intrinsic factor deficiency: Secondary | ICD-10-CM | POA: Diagnosis not present

## 2018-12-09 DIAGNOSIS — I129 Hypertensive chronic kidney disease with stage 1 through stage 4 chronic kidney disease, or unspecified chronic kidney disease: Secondary | ICD-10-CM | POA: Diagnosis not present

## 2018-12-09 DIAGNOSIS — E1165 Type 2 diabetes mellitus with hyperglycemia: Secondary | ICD-10-CM | POA: Diagnosis not present

## 2018-12-09 DIAGNOSIS — M62838 Other muscle spasm: Secondary | ICD-10-CM | POA: Diagnosis not present

## 2018-12-09 DIAGNOSIS — J309 Allergic rhinitis, unspecified: Secondary | ICD-10-CM | POA: Diagnosis not present

## 2018-12-09 DIAGNOSIS — E1142 Type 2 diabetes mellitus with diabetic polyneuropathy: Secondary | ICD-10-CM | POA: Diagnosis not present

## 2018-12-09 DIAGNOSIS — E1122 Type 2 diabetes mellitus with diabetic chronic kidney disease: Secondary | ICD-10-CM | POA: Diagnosis not present

## 2018-12-09 DIAGNOSIS — K219 Gastro-esophageal reflux disease without esophagitis: Secondary | ICD-10-CM | POA: Diagnosis not present

## 2018-12-09 DIAGNOSIS — E1169 Type 2 diabetes mellitus with other specified complication: Secondary | ICD-10-CM | POA: Diagnosis not present

## 2018-12-13 ENCOUNTER — Telehealth: Payer: Self-pay

## 2018-12-13 NOTE — Telephone Encounter (Signed)
12/13/2018 Spoke with patient to see if she was able to contact the Property tax relief program and the food banks. She was able to contact the food banks and will contact the tax relief program this week.MA

## 2018-12-15 ENCOUNTER — Ambulatory Visit (INDEPENDENT_AMBULATORY_CARE_PROVIDER_SITE_OTHER): Payer: Medicare PPO

## 2018-12-15 ENCOUNTER — Other Ambulatory Visit: Payer: Self-pay

## 2018-12-15 DIAGNOSIS — E538 Deficiency of other specified B group vitamins: Secondary | ICD-10-CM | POA: Diagnosis not present

## 2018-12-15 DIAGNOSIS — H02835 Dermatochalasis of left lower eyelid: Secondary | ICD-10-CM | POA: Diagnosis not present

## 2018-12-15 DIAGNOSIS — H1851 Endothelial corneal dystrophy: Secondary | ICD-10-CM | POA: Diagnosis not present

## 2018-12-15 DIAGNOSIS — H2513 Age-related nuclear cataract, bilateral: Secondary | ICD-10-CM | POA: Diagnosis not present

## 2018-12-15 DIAGNOSIS — E119 Type 2 diabetes mellitus without complications: Secondary | ICD-10-CM | POA: Diagnosis not present

## 2018-12-17 ENCOUNTER — Other Ambulatory Visit: Payer: Self-pay | Admitting: Internal Medicine

## 2018-12-20 ENCOUNTER — Other Ambulatory Visit: Payer: Self-pay | Admitting: Internal Medicine

## 2018-12-22 ENCOUNTER — Encounter: Payer: Self-pay | Admitting: Internal Medicine

## 2018-12-22 ENCOUNTER — Ambulatory Visit (INDEPENDENT_AMBULATORY_CARE_PROVIDER_SITE_OTHER): Payer: Medicare PPO | Admitting: Internal Medicine

## 2018-12-22 ENCOUNTER — Other Ambulatory Visit: Payer: Self-pay

## 2018-12-22 VITALS — BP 128/74 | HR 87 | Ht 62.0 in | Wt 166.0 lb

## 2018-12-22 DIAGNOSIS — I10 Essential (primary) hypertension: Secondary | ICD-10-CM

## 2018-12-22 DIAGNOSIS — R9431 Abnormal electrocardiogram [ECG] [EKG]: Secondary | ICD-10-CM | POA: Diagnosis not present

## 2018-12-22 DIAGNOSIS — E118 Type 2 diabetes mellitus with unspecified complications: Secondary | ICD-10-CM | POA: Diagnosis not present

## 2018-12-22 DIAGNOSIS — R072 Precordial pain: Secondary | ICD-10-CM | POA: Diagnosis not present

## 2018-12-22 DIAGNOSIS — Z1231 Encounter for screening mammogram for malignant neoplasm of breast: Secondary | ICD-10-CM | POA: Diagnosis not present

## 2018-12-22 DIAGNOSIS — E785 Hyperlipidemia, unspecified: Secondary | ICD-10-CM | POA: Diagnosis not present

## 2018-12-22 DIAGNOSIS — E1169 Type 2 diabetes mellitus with other specified complication: Secondary | ICD-10-CM | POA: Diagnosis not present

## 2018-12-22 DIAGNOSIS — D510 Vitamin B12 deficiency anemia due to intrinsic factor deficiency: Secondary | ICD-10-CM

## 2018-12-22 DIAGNOSIS — M1711 Unilateral primary osteoarthritis, right knee: Secondary | ICD-10-CM | POA: Diagnosis not present

## 2018-12-22 DIAGNOSIS — Z0000 Encounter for general adult medical examination without abnormal findings: Secondary | ICD-10-CM

## 2018-12-22 LAB — POCT URINALYSIS DIPSTICK
Bilirubin, UA: NEGATIVE
Blood, UA: NEGATIVE
Glucose, UA: POSITIVE — AB
Ketones, UA: NEGATIVE
Leukocytes, UA: NEGATIVE
Nitrite, UA: NEGATIVE
Protein, UA: NEGATIVE
Spec Grav, UA: 1.015 (ref 1.010–1.025)
Urobilinogen, UA: 0.2 E.U./dL
pH, UA: 5 (ref 5.0–8.0)

## 2018-12-22 NOTE — Progress Notes (Signed)
Date:  12/22/2018   Name:  Sheila Sandoval   DOB:  04-Sep-1945   MRN:  [id number]   Chief Complaint: Annual Exam (Breast Exam. ) Sheila Sandoval is a 73 y.o. female who presents today for her Complete Annual Exam. She feels well. She reports not exercising regularly. She reports she is sleeping well.   Mammogram 06/2018 Colonoscopy 08/2015 Immunizations are up to date.  Diabetes  She presents for her follow-up diabetic visit. She has type 2 diabetes mellitus. Her disease course has been stable. Pertinent negatives for hypoglycemia include no dizziness, headaches, nervousness/anxiousness or tremors. Associated symptoms include fatigue (with exertion). Pertinent negatives for diabetes include no chest pain, no polydipsia and no polyuria. Symptoms are stable (she has not lost any weight as discussed last visit). Current diabetic treatment includes oral agent (triple therapy) (janumet and jardiance; she is very reluctant to start insulin). She is compliant with treatment all of the time. Her weight is stable. She is following a generally healthy diet. She participates in exercise intermittently. There is no compliance with monitoring of blood glucose. An ACE inhibitor/angiotensin II receptor blocker is being taken. Eye exam is current.  Hypertension  This is a chronic problem. The problem is controlled. Pertinent negatives include no chest pain, headaches, palpitations or shortness of breath. Past treatments include ACE inhibitors and calcium channel blockers. The current treatment provides significant improvement. Compliance problems include diet.   Hyperlipidemia  This is a chronic problem. The problem is controlled. Pertinent negatives include no chest pain or shortness of breath. Current antihyperlipidemic treatment includes statins and ezetimibe. The current treatment provides significant improvement of lipids.  Chest Pain   This is a new problem. The current episode started 1 to 4  weeks ago. The problem occurs intermittently. The pain is mild. The quality of the pain is described as pressure. The pain does not radiate. Pertinent negatives include no abdominal pain, cough, dizziness, fever, headaches, palpitations, shortness of breath or vomiting. She has tried nothing for the symptoms.  Her past medical history is significant for hyperlipidemia and hypertension.   Lab Results  Component Value Date   HGBA1C 8.4 (H) 07/07/2018   Lab Results  Component Value Date   CREATININE 1.01 (H) 07/07/2018   BUN 11 07/07/2018   NA 141 07/07/2018   K 4.2 07/07/2018   CL 101 07/07/2018   CO2 20 07/07/2018   Lab Results  Component Value Date   CHOL 125 10/26/2017   HDL 53 10/26/2017   LDLCALC 60 10/26/2017   TRIG 61 10/26/2017   CHOLHDL 2.4 10/26/2017     Review of Systems  Constitutional: Positive for fatigue (with exertion). Negative for chills and fever.  HENT: Negative for congestion, hearing loss, tinnitus, trouble swallowing and voice change.   Eyes: Negative for visual disturbance.  Respiratory: Positive for chest tightness. Negative for cough, shortness of breath and wheezing.   Cardiovascular: Negative for chest pain, palpitations and leg swelling.  Gastrointestinal: Negative for abdominal pain, constipation, diarrhea and vomiting.  Endocrine: Negative for polydipsia and polyuria.  Genitourinary: Negative for dysuria, frequency, genital sores, vaginal bleeding and vaginal discharge.  Musculoskeletal: Negative for arthralgias, gait problem and joint swelling.  Skin: Negative for color change and rash.  Allergic/Immunologic: Negative for environmental allergies.  Neurological: Negative for dizziness, tremors, light-headedness and headaches.  Hematological: Negative for adenopathy. Does not bruise/bleed easily.  Psychiatric/Behavioral: Negative for dysphoric mood and sleep disturbance. The patient is not nervous/anxious.     Patient  Active Problem List    Diagnosis Date Noted  . Controlled type 2 diabetes mellitus with complication, without long-term current use of insulin ([place]) 07/07/2018  . Anatomical narrow angle glaucoma with borderline intraocular pressure 07/15/2017  . Vitamin D deficiency 03/11/2017  . Hypercalcemia 10/02/2016  . Pernicious anemia 04/11/2015  . Carpal tunnel syndrome 01/14/2015  . Hyperlipidemia associated with type 2 diabetes mellitus ([place]) 01/14/2015  . Essential hypertension 01/14/2015  . Climacteric 01/14/2015  . Muscle fatigue 01/14/2015  . Arthritis of hand, degenerative 01/14/2015  . GERD (gastroesophageal reflux disease) 10/29/2011  . Spondylolisthesis 10/29/2011    Allergies  Allergen Reactions  . Atorvastatin     chest tightness  . Tramadol Hcl Nausea And Vomiting    Past Surgical History:  Procedure Laterality Date  . ABDOMINAL HYSTERECTOMY  1981  . APPENDECTOMY  1981  . REPAIR DURAL / CSF LEAK  2002    Social History   Tobacco Use  . Smoking status: Never Smoker  . Smokeless tobacco: Never Used  . Tobacco comment: smoking cessation material not required  Substance Use Topics  . Alcohol use: No    Alcohol/week: 0.0 standard drinks  . Drug use: No     Medication list has been reviewed and updated.  Current Meds  Medication Sig  . amLODipine (NORVASC) 10 MG tablet TAKE 1 TABLET EVERY DAY  . ammonium lactate (AMLACTIN) 12 % cream APPLY TOPICALLY AS NEEDED FOR DRY SKIN.  Marland Kitchen aspirin 81 MG tablet Take 1 tablet by mouth daily.  . Aspirin Buf,CaCarb-MgCarb-MgO, 81 MG TABS daily  . Blood Glucose Monitoring Suppl (ACCU-CHEK AVIVA PLUS) w/Device KIT USE TWICE DAILY  . Cholecalciferol 2000 units CAPS Take 1 capsule by mouth daily.  . diclofenac sodium (VOLTAREN) 1 % GEL Apply 2 g topically 4 (four) times daily.  Marland Kitchen estradiol (ESTRACE) 0.5 MG tablet Take 1 tablet (0.5 mg total) by mouth every other day. 1 tablet every other day  . ezetimibe (ZETIA) 10 MG tablet TAKE 1 TABLET EVERY DAY  .  Ferrous Gluconate 325 (36 FE) MG TABS Take by mouth.  . fluticasone (FLONASE) 50 MCG/ACT nasal spray Place 2 sprays into both nostrils daily.  Marland Kitchen glimepiride (AMARYL) 2 MG tablet TAKE 1 TABLET EVERY DAY WITH BREAKFAST  . glucose blood (ACCU-CHEK AVIVA PLUS) test strip 1 each by Other route 2 (two) times daily.  Marland Kitchen ibuprofen (ADVIL,MOTRIN) 800 MG tablet Take 1 tablet (800 mg total) by mouth 2 (two) times daily as needed.  Marland Kitchen JANUMET 50-1000 MG tablet TAKE 1 TABLET TWICE DAILY WITH MEALS  . JARDIANCE 25 MG TABS tablet TAKE 1 TABLET EVERY DAY  . Lancet Devices (ACCU-CHEK SOFTCLIX) lancets 1 each by Other route 2 (two) times daily.  . meloxicam (MOBIC) 15 MG tablet TAKE 1 TABLET EVERY DAY  . ramipril (ALTACE) 5 MG capsule TAKE 1 CAPSULE EVERY DAY  . rosuvastatin (CRESTOR) 5 MG tablet TAKE 1 TABLET EVERY DAY  . triamcinolone cream (KENALOG) 0.1 % APPLY TOPICALLY 2 (TWO) TIMES DAILY.  Marland Kitchen triamterene-hydrochlorothiazide (MAXZIDE-25) 37.5-25 MG tablet TAKE 1 TABLET EVERY DAY   Current Facility-Administered Medications for the 12/22/18 encounter (Office Visit) with Glean Hess, MD  Medication  . cyanocobalamin ((VITAMIN B-12)) injection 1,000 mcg    PHQ 2/9 Scores 12/22/2018 12/06/2018 07/25/2018 07/07/2018  PHQ - 2 Score 0 0 0 0  PHQ- 9 Score - - - -    BP Readings from Last 3 Encounters:  12/22/18 128/74  07/25/18 128/76  07/07/18 122/78  Physical Exam Vitals signs and nursing note reviewed.  Constitutional:      General: She is not in acute distress.    Appearance: She is well-developed.  HENT:     Head: Normocephalic and atraumatic.     Right Ear: Tympanic membrane and ear canal normal.     Left Ear: Tympanic membrane and ear canal normal.     Nose:     Right Sinus: No maxillary sinus tenderness.     Left Sinus: No maxillary sinus tenderness.     Mouth/Throat:     Pharynx: Uvula midline.  Eyes:     General: No scleral icterus.       Right eye: No discharge.        Left eye:  No discharge.     Conjunctiva/sclera: Conjunctivae normal.  Neck:     Musculoskeletal: Normal range of motion. No erythema.     Thyroid: No thyromegaly.     Vascular: No carotid bruit.  Cardiovascular:     Rate and Rhythm: Normal rate and regular rhythm.  No extrasystoles are present.    Pulses: Normal pulses.     Heart sounds: Normal heart sounds. No murmur.  Pulmonary:     Effort: Pulmonary effort is normal. No respiratory distress.     Breath sounds: No wheezing.  Chest:     Breasts:        Right: No mass, nipple discharge, skin change or tenderness.        Left: No mass, nipple discharge, skin change or tenderness.  Abdominal:     General: Bowel sounds are normal.     Palpations: Abdomen is soft.     Tenderness: There is no abdominal tenderness.  Musculoskeletal: Normal range of motion.     Right knee: She exhibits effusion (and crepitus with movement).     Right lower leg: No edema.     Left lower leg: No edema.  Lymphadenopathy:     Cervical: No cervical adenopathy.  Skin:    General: Skin is warm and dry.     Findings: No rash.  Neurological:     Mental Status: She is alert and oriented to person, place, and time.     Cranial Nerves: Cranial nerves are intact. No cranial nerve deficit.     Sensory: Sensation is intact. No sensory deficit.     Motor: Motor function is intact.     Deep Tendon Reflexes: Reflexes are normal and symmetric.  Psychiatric:        Speech: Speech normal.        Behavior: Behavior normal.        Thought Content: Thought content normal.     Wt Readings from Last 3 Encounters:  12/22/18 166 lb (75.3 kg)  12/06/18 164 lb (74.4 kg)  07/25/18 165 lb 3.2 oz (74.9 kg)    BP 128/74   Pulse 87   Ht _0  (1.575 m)   Wt 166 lb (75.3 kg)   SpO2 97%   BMI 30.36 kg/m   Assessment and Plan: 1. Annual physical exam Normal exam except for weight - recommendations discussed - POCT urinalysis dipstick  2. Encounter for screening mammogram for  breast cancer To be done at [facility] - MM 3D [place]; Future  3. Precordial pain EKG abnormal but unchanged from 2016 Continue ASA 81 mg daily - EKG 12-Lead - SR @ 73; negative T waves - Ambulatory referral to Cardiology  4. Essential hypertension controlled - TSH  5.  Controlled type 2 diabetes mellitus with complication, without long-term current use of insulin ([facility]) Continue current medications - if A1C is not improved, pt will need to return to begin insulin injections - Comprehensive metabolic panel - Hemoglobin A1c  6. Hyperlipidemia associated with type 2 diabetes mellitus ([place]) On statin therapy - Lipid panel  7. Pernicious anemia Check labs - CBC with Differential/Platelet  8. Abnormal EKG As above - Ambulatory referral to Cardiology  9. Primary osteoarthritis of right knee Recommend Ortho evaluation   Partially dictated using Editor, commissioning. Any errors are unintentional.  Halina Maidens, MD [facility]  12/22/2018

## 2018-12-22 NOTE — Patient Instructions (Signed)
I recommend seeing Orthopedics about your right knee pain and swelling  See Cardiology - Dr. Elsie Stain regarding chest discomfort and abnormal EKG - someone will be calling you.

## 2018-12-23 LAB — CBC WITH DIFFERENTIAL/PLATELET
Basophils Absolute: 0.1 10*3/uL (ref 0.0–0.2)
Basos: 1 %
EOS (ABSOLUTE): 0.1 10*3/uL (ref 0.0–0.4)
Eos: 2 %
Hematocrit: 43.7 % (ref 34.0–46.6)
Hemoglobin: 13.1 g/dL (ref 11.1–15.9)
Immature Grans (Abs): 0 10*3/uL (ref 0.0–0.1)
Immature Granulocytes: 0 %
Lymphocytes Absolute: 2.3 10*3/uL (ref 0.7–3.1)
Lymphs: 37 %
MCH: 22.6 pg — ABNORMAL LOW (ref 26.6–33.0)
MCHC: 30 g/dL — ABNORMAL LOW (ref 31.5–35.7)
MCV: 75 fL — ABNORMAL LOW (ref 79–97)
Monocytes Absolute: 0.4 10*3/uL (ref 0.1–0.9)
Monocytes: 7 %
Neutrophils Absolute: 3.3 10*3/uL (ref 1.4–7.0)
Neutrophils: 53 %
Platelets: 288 10*3/uL (ref 150–450)
RBC: 5.8 x10E6/uL — ABNORMAL HIGH (ref 3.77–5.28)
RDW: 16 % — ABNORMAL HIGH (ref 11.7–15.4)
WBC: 6.2 10*3/uL (ref 3.4–10.8)

## 2018-12-23 LAB — COMPREHENSIVE METABOLIC PANEL
ALT: 18 IU/L (ref 0–32)
AST: 16 IU/L (ref 0–40)
Albumin/Globulin Ratio: 2.1 (ref 1.2–2.2)
Albumin: 4.5 g/dL (ref 3.7–4.7)
Alkaline Phosphatase: 77 IU/L (ref 39–117)
BUN/Creatinine Ratio: 23 (ref 12–28)
BUN: 28 mg/dL — ABNORMAL HIGH (ref 8–27)
Bilirubin Total: 0.4 mg/dL (ref 0.0–1.2)
CO2: 21 mmol/L (ref 20–29)
Calcium: 10.6 mg/dL — ABNORMAL HIGH (ref 8.7–10.3)
Chloride: 100 mmol/L (ref 96–106)
Creatinine, Ser: 1.22 mg/dL — ABNORMAL HIGH (ref 0.57–1.00)
GFR calc Af Amer: 51 mL/min/{1.73_m2} — ABNORMAL LOW (ref >59)
GFR calc non Af Amer: 44 mL/min/{1.73_m2} — ABNORMAL LOW (ref >59)
Globulin, Total: 2.1 g/dL (ref 1.5–4.5)
Glucose: 146 mg/dL — ABNORMAL HIGH (ref 65–99)
Potassium: 4.2 mmol/L (ref 3.5–5.2)
Sodium: 139 mmol/L (ref 134–144)
Total Protein: 6.6 g/dL (ref 6.0–8.5)

## 2018-12-23 LAB — LIPID PANEL
Chol/HDL Ratio: 2.3 ratio (ref 0.0–4.4)
Cholesterol, Total: 106 mg/dL (ref 100–199)
HDL: 46 mg/dL (ref >39)
LDL Calculated: 44 mg/dL (ref 0–99)
Triglycerides: 81 mg/dL (ref 0–149)
VLDL Cholesterol Cal: 16 mg/dL (ref 5–40)

## 2018-12-23 LAB — HEMOGLOBIN A1C
Est. average glucose Bld gHb Est-mCnc: 186 mg/dL
Hgb A1c MFr Bld: 8.1 % — ABNORMAL HIGH (ref 4.8–5.6)

## 2018-12-23 LAB — TSH: TSH: 1.71 u[IU]/mL (ref 0.450–4.500)

## 2018-12-27 DIAGNOSIS — R079 Chest pain, unspecified: Secondary | ICD-10-CM | POA: Diagnosis not present

## 2019-01-05 DIAGNOSIS — R079 Chest pain, unspecified: Secondary | ICD-10-CM | POA: Diagnosis not present

## 2019-01-15 ENCOUNTER — Other Ambulatory Visit: Payer: Self-pay

## 2019-01-15 MED ORDER — ACCU-CHEK SOFTCLIX LANCETS MISC: 12 refills | Status: DC

## 2019-01-15 MED ORDER — ACCU-CHEK AVIVA VI STRP: ORAL_STRIP | 12 refills | Status: DC

## 2019-01-19 ENCOUNTER — Encounter: Payer: Self-pay | Admitting: Internal Medicine

## 2019-01-19 ENCOUNTER — Ambulatory Visit (INDEPENDENT_AMBULATORY_CARE_PROVIDER_SITE_OTHER): Payer: Medicare PPO | Admitting: Internal Medicine

## 2019-01-19 ENCOUNTER — Other Ambulatory Visit: Payer: Self-pay

## 2019-01-19 VITALS — BP 118/78 | HR 118 | Ht 64.0 in | Wt 166.0 lb

## 2019-01-19 DIAGNOSIS — M545 Low back pain, unspecified: Secondary | ICD-10-CM

## 2019-01-19 DIAGNOSIS — M25561 Pain in right knee: Secondary | ICD-10-CM | POA: Diagnosis not present

## 2019-01-19 DIAGNOSIS — G8929 Other chronic pain: Secondary | ICD-10-CM

## 2019-01-19 DIAGNOSIS — D510 Vitamin B12 deficiency anemia due to intrinsic factor deficiency: Secondary | ICD-10-CM

## 2019-01-19 LAB — POCT URINALYSIS DIPSTICK
Bilirubin, UA: NEGATIVE
Blood, UA: NEGATIVE
Glucose, UA: NEGATIVE
Ketones, UA: NEGATIVE
Leukocytes, UA: NEGATIVE
Nitrite, UA: NEGATIVE
Protein, UA: NEGATIVE
Spec Grav, UA: 1.01 (ref 1.010–1.025)
Urobilinogen, UA: 0.2 E.U./dL
pH, UA: 7 (ref 5.0–8.0)

## 2019-01-19 MED ORDER — CYANOCOBALAMIN 1000 MCG/ML IJ SOLN
1000.0000 ug | Freq: Once | INTRAMUSCULAR | Status: AC
  Administered 2019-01-19: 1000 ug via INTRAMUSCULAR

## 2019-01-19 NOTE — Progress Notes (Signed)
Date:  01/19/2019   Name:  Sheila Sandoval   DOB:  07/27/1945   MRN:  [id number]   Chief Complaint: Knee Pain (Knee pain on the inside of the L) knee. ) and Back Pain (Back pain in the last week. Said she is wondering if its her kidneys. Not hurting right now but on and off. Soreness in the middle of lower back.)  Knee Pain  There was no injury mechanism. The pain is present in the right knee. The quality of the pain is described as aching and cramping. The pain is moderate. The pain has been worsening since onset. Associated symptoms include an inability to bear weight. Pertinent negatives include no muscle weakness, numbness or tingling. The symptoms are aggravated by weight bearing. She has tried acetaminophen for the symptoms. The treatment provided no relief.  Back Pain This is a new problem. The current episode started in the past 7 days. The problem occurs daily. The pain is present in the sacro-iliac. The pain does not radiate. The pain is moderate. The symptoms are aggravated by sitting and twisting. Pertinent negatives include no chest pain, dysuria, numbness or tingling. Risk factors: recent change in walking due to knee problems.    Review of Systems  Constitutional: Negative for chills and fatigue.  Respiratory: Negative for cough, chest tightness, shortness of breath and wheezing.   Cardiovascular: Negative for chest pain and leg swelling.  Genitourinary: Negative for dysuria, frequency, hematuria and urgency.  Musculoskeletal: Positive for arthralgias, back pain, gait problem and joint swelling.  Neurological: Negative for tingling and numbness.    Patient Active Problem List   Diagnosis Date Noted  . Controlled type 2 diabetes mellitus with complication, without long-term current use of insulin ([place]) 07/07/2018  . Anatomical narrow angle glaucoma with borderline intraocular pressure 07/15/2017  . Vitamin D deficiency 03/11/2017  . Hypercalcemia 10/02/2016  .  Pernicious anemia 04/11/2015  . Carpal tunnel syndrome 01/14/2015  . Hyperlipidemia associated with type 2 diabetes mellitus ([place]) 01/14/2015  . Essential hypertension 01/14/2015  . Climacteric 01/14/2015  . Muscle fatigue 01/14/2015  . Arthritis of hand, degenerative 01/14/2015  . GERD (gastroesophageal reflux disease) 10/29/2011  . Spondylolisthesis 10/29/2011    Allergies  Allergen Reactions  . Atorvastatin     chest tightness  . Tramadol Hcl Nausea And Vomiting    Past Surgical History:  Procedure Laterality Date  . ABDOMINAL HYSTERECTOMY  1981  . APPENDECTOMY  1981  . REPAIR DURAL / CSF LEAK  2002    Social History   Tobacco Use  . Smoking status: Never Smoker  . Smokeless tobacco: Never Used  . Tobacco comment: smoking cessation material not required  Substance Use Topics  . Alcohol use: No    Alcohol/week: 0.0 standard drinks  . Drug use: No     Medication list has been reviewed and updated.  Current Meds  Medication Sig  . Accu-Chek Softclix Lancets lancets Use as instructed  . amLODipine (NORVASC) 10 MG tablet TAKE 1 TABLET EVERY DAY  . ammonium lactate (AMLACTIN) 12 % cream APPLY TOPICALLY AS NEEDED FOR DRY SKIN.  Marland Kitchen aspirin 81 MG tablet Take 1 tablet by mouth daily.  . Aspirin Buf,CaCarb-MgCarb-MgO, 81 MG TABS daily  . Blood Glucose Monitoring Suppl (ACCU-CHEK AVIVA PLUS) w/Device KIT USE TWICE DAILY  . Cholecalciferol 2000 units CAPS Take 1 capsule by mouth daily.  . diclofenac sodium (VOLTAREN) 1 % GEL Apply 2 g topically 4 (four) times daily.  Marland Kitchen estradiol (  ESTRACE) 0.5 MG tablet Take 1 tablet (0.5 mg total) by mouth every other day. 1 tablet every other day  . ezetimibe (ZETIA) 10 MG tablet TAKE 1 TABLET EVERY DAY  . Ferrous Gluconate 325 (36 FE) MG TABS Take by mouth.  . fluticasone (FLONASE) 50 MCG/ACT nasal spray Place 2 sprays into both nostrils daily.  Marland Kitchen glimepiride (AMARYL) 2 MG tablet TAKE 1 TABLET EVERY DAY WITH BREAKFAST  . glucose blood  (ACCU-CHEK AVIVA) test strip Use as instructed  . JANUMET 50-1000 MG tablet TAKE 1 TABLET TWICE DAILY WITH MEALS  . JARDIANCE 25 MG TABS tablet TAKE 1 TABLET EVERY DAY  . Lancet Devices (ACCU-CHEK SOFTCLIX) lancets 1 each by Other route 2 (two) times daily.  . ramipril (ALTACE) 5 MG capsule TAKE 1 CAPSULE EVERY DAY  . rosuvastatin (CRESTOR) 5 MG tablet TAKE 1 TABLET EVERY DAY  . triamcinolone cream (KENALOG) 0.1 % APPLY TOPICALLY 2 (TWO) TIMES DAILY.  Marland Kitchen triamterene-hydrochlorothiazide (MAXZIDE-25) 37.5-25 MG tablet TAKE 1 TABLET EVERY DAY  . [DISCONTINUED] ibuprofen (ADVIL,MOTRIN) 800 MG tablet Take 1 tablet (800 mg total) by mouth 2 (two) times daily as needed.  . [DISCONTINUED] meloxicam (MOBIC) 15 MG tablet TAKE 1 TABLET EVERY DAY   Current Facility-Administered Medications for the 01/19/19 encounter (Office Visit) with Glean Hess, MD  Medication  . cyanocobalamin ((VITAMIN B-12)) injection 1,000 mcg    PHQ 2/9 Scores 01/19/2019 12/22/2018 12/06/2018 07/25/2018  PHQ - 2 Score 0 0 0 0  PHQ- 9 Score - - - -    BP Readings from Last 3 Encounters:  01/19/19 118/78  12/22/18 128/74  07/25/18 128/76    Physical Exam Vitals signs and nursing note reviewed.  Constitutional:      General: She is not in acute distress.    Appearance: She is well-developed.  HENT:     Head: Normocephalic and atraumatic.  Cardiovascular:     Rate and Rhythm: Normal rate and regular rhythm.  Pulmonary:     Effort: Pulmonary effort is normal. No respiratory distress.     Breath sounds: No wheezing or rhonchi.  Abdominal:     Palpations: Abdomen is soft.     Tenderness: There is no abdominal tenderness. There is no right CVA tenderness or left CVA tenderness.  Musculoskeletal: Normal range of motion.     Right knee: She exhibits swelling. Tenderness found. Medial joint line and patellar tendon tenderness noted.     Lumbar back: She exhibits tenderness (over right SI region). She exhibits no bony  tenderness, no swelling and no edema.  Skin:    General: Skin is warm and dry.     Findings: No rash.  Neurological:     Mental Status: She is alert and oriented to person, place, and time.  Psychiatric:        Behavior: Behavior normal.        Thought Content: Thought content normal.     Wt Readings from Last 3 Encounters:  01/19/19 166 lb (75.3 kg)  12/22/18 166 lb (75.3 kg)  12/06/18 164 lb (74.4 kg)    BP 118/78   Pulse (!) 118   Ht _0  (1.626 m)   Wt 166 lb (75.3 kg)   SpO2 99%   BMI 28.49 kg/m   Assessment and Plan: 1. Acute bilateral low back pain without sciatica UA negative Low back pain likely due to abnormal gait - POCT Urinalysis Dipstick  2. Chronic pain of right knee Recommend Ortho evaluation Continue tylenol ,  ice/heat if helpful - Ambulatory referral to Orthopedic Surgery  3. Pernicious anemia - cyanocobalamin ((VITAMIN B-12)) injection 1,000 mcg   Partially dictated using Editor, commissioning. Any errors are unintentional.  Halina Maidens, MD [facility]  01/19/2019

## 2019-01-19 NOTE — Progress Notes (Deleted)
Date:  01/19/2019   Name:  Sheila Sandoval   DOB:  May 12, 1946   MRN:  [id number]   Chief Complaint: Knee Pain (Knee pain on the inside of the L) knee. ) and Back Pain (Back pain in the last week. Said she is wondering if its her kidneys. Not hurting right now but on and off. Soreness in the middle of lower back.)  Knee Pain   Back Pain    Review of Systems  Musculoskeletal: Positive for back pain.    Patient Active Problem List   Diagnosis Date Noted  . Controlled type 2 diabetes mellitus with complication, without long-term current use of insulin ([place]) 07/07/2018  . Anatomical narrow angle glaucoma with borderline intraocular pressure 07/15/2017  . Vitamin D deficiency 03/11/2017  . Hypercalcemia 10/02/2016  . Pernicious anemia 04/11/2015  . Carpal tunnel syndrome 01/14/2015  . Hyperlipidemia associated with type 2 diabetes mellitus ([place]) 01/14/2015  . Essential hypertension 01/14/2015  . Climacteric 01/14/2015  . Muscle fatigue 01/14/2015  . Arthritis of hand, degenerative 01/14/2015  . GERD (gastroesophageal reflux disease) 10/29/2011  . Spondylolisthesis 10/29/2011    Allergies  Allergen Reactions  . Atorvastatin     chest tightness  . Tramadol Hcl Nausea And Vomiting    Past Surgical History:  Procedure Laterality Date  . ABDOMINAL HYSTERECTOMY  1981  . APPENDECTOMY  1981  . REPAIR DURAL / CSF LEAK  2002    Social History   Tobacco Use  . Smoking status: Never Smoker  . Smokeless tobacco: Never Used  . Tobacco comment: smoking cessation material not required  Substance Use Topics  . Alcohol use: No    Alcohol/week: 0.0 standard drinks  . Drug use: No     Medication list has been reviewed and updated.  Current Meds  Medication Sig  . Accu-Chek Softclix Lancets lancets Use as instructed  . amLODipine (NORVASC) 10 MG tablet TAKE 1 TABLET EVERY DAY  . ammonium lactate (AMLACTIN) 12 % cream APPLY TOPICALLY AS NEEDED FOR DRY SKIN.  Marland Kitchen aspirin  81 MG tablet Take 1 tablet by mouth daily.  . Aspirin Buf,CaCarb-MgCarb-MgO, 81 MG TABS daily  . Blood Glucose Monitoring Suppl (ACCU-CHEK AVIVA PLUS) w/Device KIT USE TWICE DAILY  . Cholecalciferol 2000 units CAPS Take 1 capsule by mouth daily.  . diclofenac sodium (VOLTAREN) 1 % GEL Apply 2 g topically 4 (four) times daily.  Marland Kitchen estradiol (ESTRACE) 0.5 MG tablet Take 1 tablet (0.5 mg total) by mouth every other day. 1 tablet every other day  . ezetimibe (ZETIA) 10 MG tablet TAKE 1 TABLET EVERY DAY  . Ferrous Gluconate 325 (36 FE) MG TABS Take by mouth.  . fluticasone (FLONASE) 50 MCG/ACT nasal spray Place 2 sprays into both nostrils daily.  Marland Kitchen glimepiride (AMARYL) 2 MG tablet TAKE 1 TABLET EVERY DAY WITH BREAKFAST  . glucose blood (ACCU-CHEK AVIVA) test strip Use as instructed  . ibuprofen (ADVIL,MOTRIN) 800 MG tablet Take 1 tablet (800 mg total) by mouth 2 (two) times daily as needed.  Marland Kitchen JANUMET 50-1000 MG tablet TAKE 1 TABLET TWICE DAILY WITH MEALS  . JARDIANCE 25 MG TABS tablet TAKE 1 TABLET EVERY DAY  . Lancet Devices (ACCU-CHEK SOFTCLIX) lancets 1 each by Other route 2 (two) times daily.  . meloxicam (MOBIC) 15 MG tablet TAKE 1 TABLET EVERY DAY  . ramipril (ALTACE) 5 MG capsule TAKE 1 CAPSULE EVERY DAY  . rosuvastatin (CRESTOR) 5 MG tablet TAKE 1 TABLET EVERY DAY  .  triamcinolone cream (KENALOG) 0.1 % APPLY TOPICALLY 2 (TWO) TIMES DAILY.  Marland Kitchen triamterene-hydrochlorothiazide (MAXZIDE-25) 37.5-25 MG tablet TAKE 1 TABLET EVERY DAY   Current Facility-Administered Medications for the 01/19/19 encounter (Office Visit) with Glean Hess, MD  Medication  . cyanocobalamin ((VITAMIN B-12)) injection 1,000 mcg  . cyanocobalamin ((VITAMIN B-12)) injection 1,000 mcg    PHQ 2/9 Scores 01/19/2019 12/22/2018 12/06/2018 07/25/2018  PHQ - 2 Score 0 0 0 0  PHQ- 9 Score - - - -    BP Readings from Last 3 Encounters:  01/19/19 118/78  12/22/18 128/74  07/25/18 128/76    Physical Exam  Wt  Readings from Last 3 Encounters:  01/19/19 166 lb (75.3 kg)  12/22/18 166 lb (75.3 kg)  12/06/18 164 lb (74.4 kg)    BP 118/78   Pulse (!) 118   Ht _0  (1.626 m)   Wt 166 lb (75.3 kg)   SpO2 99%   BMI 28.49 kg/m   Assessment and Plan:

## 2019-02-07 DIAGNOSIS — M1711 Unilateral primary osteoarthritis, right knee: Secondary | ICD-10-CM | POA: Diagnosis not present

## 2019-02-07 DIAGNOSIS — M79644 Pain in right finger(s): Secondary | ICD-10-CM | POA: Diagnosis not present

## 2019-02-09 ENCOUNTER — Other Ambulatory Visit: Payer: Self-pay | Admitting: Internal Medicine

## 2019-02-19 ENCOUNTER — Other Ambulatory Visit: Payer: Self-pay | Admitting: Internal Medicine

## 2019-02-23 ENCOUNTER — Telehealth: Payer: Self-pay

## 2019-02-23 NOTE — Telephone Encounter (Signed)
Please call pt and schedule nurse visit for B12 shot. She is due. I will be here today til 330. I will be back Monday.  Thank you.

## 2019-02-27 ENCOUNTER — Other Ambulatory Visit: Payer: Self-pay | Admitting: Internal Medicine

## 2019-02-27 DIAGNOSIS — R21 Rash and other nonspecific skin eruption: Secondary | ICD-10-CM

## 2019-03-05 DIAGNOSIS — Z6830 Body mass index (BMI) 30.0-30.9, adult: Secondary | ICD-10-CM | POA: Diagnosis not present

## 2019-03-05 DIAGNOSIS — E1165 Type 2 diabetes mellitus with hyperglycemia: Secondary | ICD-10-CM | POA: Diagnosis not present

## 2019-03-05 DIAGNOSIS — E669 Obesity, unspecified: Secondary | ICD-10-CM | POA: Diagnosis not present

## 2019-03-05 DIAGNOSIS — J309 Allergic rhinitis, unspecified: Secondary | ICD-10-CM | POA: Diagnosis not present

## 2019-03-05 DIAGNOSIS — E1122 Type 2 diabetes mellitus with diabetic chronic kidney disease: Secondary | ICD-10-CM | POA: Diagnosis not present

## 2019-03-05 DIAGNOSIS — D509 Iron deficiency anemia, unspecified: Secondary | ICD-10-CM | POA: Diagnosis not present

## 2019-03-05 DIAGNOSIS — E559 Vitamin D deficiency, unspecified: Secondary | ICD-10-CM | POA: Diagnosis not present

## 2019-03-05 DIAGNOSIS — I129 Hypertensive chronic kidney disease with stage 1 through stage 4 chronic kidney disease, or unspecified chronic kidney disease: Secondary | ICD-10-CM | POA: Diagnosis not present

## 2019-03-05 DIAGNOSIS — E785 Hyperlipidemia, unspecified: Secondary | ICD-10-CM | POA: Diagnosis not present

## 2019-03-05 DIAGNOSIS — N183 Chronic kidney disease, stage 3 (moderate): Secondary | ICD-10-CM | POA: Diagnosis not present

## 2019-03-05 DIAGNOSIS — M545 Low back pain: Secondary | ICD-10-CM | POA: Diagnosis not present

## 2019-03-05 DIAGNOSIS — E1142 Type 2 diabetes mellitus with diabetic polyneuropathy: Secondary | ICD-10-CM | POA: Diagnosis not present

## 2019-03-05 DIAGNOSIS — Z79890 Hormone replacement therapy (postmenopausal): Secondary | ICD-10-CM | POA: Diagnosis not present

## 2019-03-07 ENCOUNTER — Telehealth: Payer: Self-pay

## 2019-03-07 NOTE — Telephone Encounter (Signed)
Patient called saying she is having recurrent knee, hip, and leg pain and stiffness. She said its worse in the morning when she wakes up.  Told patient that she is on Voltaren gel, tried MOBIC, and taking high dose ibuprofen. We referred her to orthopedics for this, she needs to call them and follow up if no better.  She said MOBIC helped in the past- told her she can no longer take that because her decreased kidney function. Told her to call ortho and tell them this helped but need alternative that will not mess with kidney function.  She verbalized understanding of this.

## 2019-03-09 ENCOUNTER — Telehealth: Payer: Self-pay

## 2019-03-09 NOTE — Telephone Encounter (Signed)
Fine to take as prescribed.

## 2019-03-09 NOTE — Telephone Encounter (Signed)
Patient asked if it is okay to take Methylprednisolone that Dr.Skully (Orthopedic) recommended. I advised that I will report this to PCP and that we do trust specialists to go ahead and take this and if PCP has any issues I will callback.

## 2019-03-12 ENCOUNTER — Other Ambulatory Visit: Payer: Self-pay | Admitting: *Deleted

## 2019-03-12 NOTE — Patient Outreach (Signed)
[place] [facility]) Care Management  03/12/2019   Sheila Sandoval 23-Aug-1945 [id number]  RN Health Coach received a  telephone call from patient.  [employer] compliance verified. Per patient she had just came across the letter from 2019 that her case was close due to unable to contact her. Per patient she had been getting so many robo calls so she didn't return our calls. Patient has not been checking her blood sugar because her meter stopped working. Patient has not changed the batteries since receiving meter over 2 years ago. RN told patient to take meter to [facility] and get some batteries and if meter does not work then contact her.We will see about getting a new meter. Patient A1C is 8.1. Per patient she is trying to exercise more and trying to watch her diet. Per patient she can afford her medications. Patient stated that she is having a hard time affording good healthy food. Patient does have transportation to the Dr office for appointments. Her daughter Randell Patient helps her as needed. Patient has agreed to follow up appointments and knows the number Health Coach will be calling from to answer it. Current Medications:   Current Outpatient Medications  Medication Sig Dispense Refill  . Accu-Chek Softclix Lancets lancets Use as instructed 100 each 12  . amLODipine (NORVASC) 10 MG tablet TAKE 1 TABLET EVERY DAY 90 tablet 3  . ammonium lactate (AMLACTIN) 12 % cream APPLY TOPICALLY AS NEEDED FOR DRY SKIN. 385 g 1  . aspirin 81 MG tablet Take 1 tablet by mouth daily.    . Aspirin Buf,CaCarb-MgCarb-MgO, 81 MG TABS daily    . Blood Glucose Monitoring Suppl (ACCU-CHEK AVIVA PLUS) w/Device KIT USE TWICE DAILY 1 kit 0  . Cholecalciferol 2000 units CAPS Take 1 capsule by mouth daily.    . diclofenac sodium (VOLTAREN) 1 % GEL Apply 2 g topically 4 (four) times daily. 600 g 3  . estradiol (ESTRACE) 0.5 MG tablet TAKE 1 TABLET EVERY OTHER DAY 45 tablet 3  . ezetimibe (ZETIA) 10 MG tablet TAKE 1 TABLET  EVERY DAY 90 tablet 3  . Ferrous Gluconate 325 (36 FE) MG TABS Take by mouth.    . fluticasone (FLONASE) 50 MCG/ACT nasal spray Place 2 sprays into both nostrils daily. 48 g 3  . glimepiride (AMARYL) 2 MG tablet TAKE 1 TABLET EVERY DAY WITH BREAKFAST 90 tablet 3  . glucose blood (ACCU-CHEK AVIVA) test strip Use as instructed 100 each 12  . JANUMET 50-1000 MG tablet TAKE 1 TABLET TWICE DAILY WITH MEALS 180 tablet 3  . JARDIANCE 25 MG TABS tablet TAKE 1 TABLET EVERY DAY 90 tablet 3  . Lancet Devices (ACCU-CHEK SOFTCLIX) lancets 1 each by Other route 2 (two) times daily. 200 each 3  . ramipril (ALTACE) 5 MG capsule TAKE 1 CAPSULE EVERY DAY 90 capsule 3  . rosuvastatin (CRESTOR) 5 MG tablet TAKE 1 TABLET EVERY DAY 90 tablet 3  . triamcinolone cream (KENALOG) 0.1 % APPLY TOPICALLY 2 (TWO) TIMES DAILY. 80 g 0  . triamterene-hydrochlorothiazide (MAXZIDE-25) 37.5-25 MG tablet TAKE 1 TABLET EVERY DAY 90 tablet 3   Current Facility-Administered Medications  Medication Dose Route Frequency Provider Last Rate Last Dose  . cyanocobalamin ((VITAMIN B-12)) injection 1,000 mcg  1,000 mcg Intramuscular Q30 days Juline Patch, MD   1,000 mcg at 12/15/18 1408    Functional Status:  In your present state of health, do you have any difficulty performing the following activities: 03/12/2019 12/06/2018  Hearing? Darreld Mclean  N  Comment - declines hearing aids  Vision? Y N  Comment sometimes blurry wears glasses  Difficulty concentrating or making decisions? N N  Walking or climbing stairs? Y N  Comment pain in knees at times -  Dressing or bathing? N N  Doing errands, shopping? N N  Preparing Food and eating ? N N  Using the Toilet? N N  In the past six months, have you accidently leaked urine? N N  Do you have problems with loss of bowel control? N N  Managing your Medications? N N  Managing your Finances? N N  Housekeeping or managing your Housekeeping? N N  Some recent data might be hidden    Fall/Depression  Screening: Fall Risk  03/12/2019 01/19/2019 12/22/2018  Falls in the past year? 0 0 0  Comment - - -  Number falls in past yr: 0 0 0  Injury with Fall? 0 0 0  Comment - - -  Risk Factor Category  Low Risk (0 Points) - -  Comment - - -  Risk for fall due to : Impaired balance/gait;Impaired mobility;History of fall(s) - -  Risk for fall due to: Comment - - -  Follow up Falls evaluation completed;Education provided Falls evaluation completed Falls evaluation completed   PHQ 2/9 Scores 03/12/2019 01/19/2019 12/22/2018 12/06/2018 07/25/2018 07/07/2018 02/01/2018  PHQ - 2 Score 0 0 0 0 0 0 2  PHQ- 9 Score - - - - - - 4   THN CM Care Plan Problem One     Most Recent Value  Care Plan Problem One  Knowledge Deficit in Diabetes Self Management  Role Documenting the Problem One  [place] for Problem One  Active  THN Long Term Goal   Patient will see a decrease in A1C from 8.1 within the next 90 days  THN Long Term Goal Start Date  03/12/19  Interventions for Problem One Long Term Goal  Rn discussed 1st you have to check the blood sugars to know what they are. Then RN gave the patient a range of 80-13 fasting for the A1C to bee less than 7. RN explained to patient to take meter to pharmacy and change batteries to get it working. RN will follow up for further discussion  THN CM Short Term Goal #1   Patient will check her blood sugars and document within the next 30 days  THN CM Short Term Goal #1 Start Date  03/12/19  Interventions for Short Term Goal #1  RN discussed getting the meter fixed or a new one. RN sent a 2020 Calendar book for documentation. RN sent EMMI educational material on why check your blood sugars. RN will follow up for compliance  THN CM Short Term Goal #2   Patient will verbalize eating healthy diabetic diet within the next 30 days  THN CM Short Term Goal #2 Start Date  03/12/19  Interventions for Short Term Goal #2  RN discussed healthy eating of meals and snacks. RN  will follow up with further discussion      Assessment:  Patient is not checking blood sugars Per patient meter is not working A1C 8.1 Patient will benefit from [employer] telephonic outreach for education and support for diabetes self management.  Plan:  RN discussed changing batteries or a new meter RN sent a 2020 Calendar book for documentation RN discussed checking blood sugars and why RN discussed healthy eating RN referred to Sports administrator sent an a  assessment and barriers letter to PCP RN will follow up outreach within the month of September  Shacara Cozine [place] Management [phone]

## 2019-03-13 ENCOUNTER — Other Ambulatory Visit: Payer: Self-pay

## 2019-03-13 NOTE — Patient Outreach (Signed)
[place] [facility]) Care Management  03/13/2019  SAHVANNAH RIESER 1946/05/02 [id number]  Social work referral received from [facility], [employer].  "This patient is asking to be referred to social worker to see if she can get assistance getting ebt card. Patient does not get any assistance and stated she is having a hard time. Home is [phone] and cell is [phone]. This case had been closed for past Education officer, museum and Research scientist (medical) due to unable to contact. Patient called RN today stating she just found the closure letter from 2019. She didn't answer calls due to so many robo calls. RN made patient aware that if she does not answer our calls we can not assist and her case will be closed after so many calls." Successful outreach to patient today.  Discussed application process for food and nutrition services.  Asked patient if daughter is able to assist with completion of application but she stated that she will be able to complete it.  Offered to send patient list of food pantries/kitchens in the area.  Patient declined stating "I have been to several in the area but don't like the food" Will follow up with patient within the next two weeks to ensure receipt of Application for Food and Nutrition Services.  Ronn Melena, BSW Social Worker [phone]

## 2019-03-16 ENCOUNTER — Ambulatory Visit (INDEPENDENT_AMBULATORY_CARE_PROVIDER_SITE_OTHER): Payer: Medicare PPO | Admitting: Internal Medicine

## 2019-03-16 ENCOUNTER — Encounter: Payer: Self-pay | Admitting: Internal Medicine

## 2019-03-16 ENCOUNTER — Other Ambulatory Visit: Payer: Self-pay

## 2019-03-16 ENCOUNTER — Ambulatory Visit: Payer: Self-pay | Admitting: Internal Medicine

## 2019-03-16 VITALS — BP 138/80 | HR 64 | Temp 98.3°F | Ht 64.0 in | Wt 162.0 lb

## 2019-03-16 DIAGNOSIS — R0789 Other chest pain: Secondary | ICD-10-CM | POA: Diagnosis not present

## 2019-03-16 DIAGNOSIS — M6289 Other specified disorders of muscle: Secondary | ICD-10-CM

## 2019-03-16 DIAGNOSIS — D510 Vitamin B12 deficiency anemia due to intrinsic factor deficiency: Secondary | ICD-10-CM | POA: Diagnosis not present

## 2019-03-16 DIAGNOSIS — E118 Type 2 diabetes mellitus with unspecified complications: Secondary | ICD-10-CM

## 2019-03-16 MED ORDER — CYANOCOBALAMIN 1000 MCG/ML IJ SOLN
1000.0000 ug | Freq: Once | INTRAMUSCULAR | Status: AC
  Administered 2019-03-16: 1000 ug via INTRAMUSCULAR

## 2019-03-16 NOTE — Progress Notes (Signed)
Date:  03/16/2019   Name:  Sheila Sandoval   DOB:  [birth year]   MRN:  [id number]   Chief Complaint: Fatigue (X2 weeks. Noticed when it started she felt sore in her ribs and extremely fatigue. Having hand tremors.  Wants A1C and thyroid check today- and Urine check to be sure no UTI) and Immunizations (B12 vaccine due today.)  Chest Pain  This is a new (pain around anterior ribs) problem. The current episode started 1 to 4 weeks ago. The problem occurs constantly. The problem has been resolved. The quality of the pain is described as dull. Associated symptoms include malaise/fatigue and weakness. Pertinent negatives include no abdominal pain, cough, diaphoresis, fever, headaches, near-syncope, orthopnea, palpitations, shortness of breath or sputum production. She has tried nothing (near the end of a prednisone taper for knee pain) for the symptoms. The treatment provided significant relief.  Diabetes She presents for her follow-up diabetic visit. She has type 2 diabetes mellitus. Her disease course has been fluctuating (higher since prednisone taper). Pertinent negatives for hypoglycemia include no headaches. Associated symptoms include chest pain, fatigue and weakness.  Tremor - she has noticed a tremor in her right arm when she tries to use the arm for a long period of time.  No tremor at rest and no problems writing.  No head tremor, no left hand tremor, no family hx of Parkinson's.  Lab Results  Component Value Date   HGBA1C 8.1 (H) 12/22/2018   Lab Results  Component Value Date   WBC 6.2 12/22/2018   HGB 13.1 12/22/2018   HCT 43.7 12/22/2018   MCV 75 (L) 12/22/2018   PLT 288 12/22/2018     Review of Systems  Constitutional: Positive for fatigue and malaise/fatigue. Negative for diaphoresis and fever.  Respiratory: Negative for cough, sputum production and shortness of breath.   Cardiovascular: Positive for chest pain. Negative for palpitations, orthopnea and near-syncope.   Gastrointestinal: Negative for abdominal pain.  Neurological: Positive for weakness. Negative for headaches.    Patient Active Problem List   Diagnosis Date Noted  . Type II diabetes mellitus with complication ([place]) 21/97/5883  . Anatomical narrow angle glaucoma with borderline intraocular pressure 07/15/2017  . Vitamin D deficiency 03/11/2017  . Hypercalcemia 10/02/2016  . Pernicious anemia 04/11/2015  . Carpal tunnel syndrome 01/14/2015  . Hyperlipidemia associated with type 2 diabetes mellitus ([place]) 01/14/2015  . Essential hypertension 01/14/2015  . Climacteric 01/14/2015  . Muscle fatigue 01/14/2015  . Arthritis of hand, degenerative 01/14/2015  . GERD (gastroesophageal reflux disease) 10/29/2011  . Spondylolisthesis 10/29/2011    Allergies  Allergen Reactions  . Atorvastatin     chest tightness  . Tramadol Hcl Nausea And Vomiting    Past Surgical History:  Procedure Laterality Date  . ABDOMINAL HYSTERECTOMY  1981  . APPENDECTOMY  1981  . REPAIR DURAL / CSF LEAK  2002    Social History   Tobacco Use  . Smoking status: Never Smoker  . Smokeless tobacco: Never Used  . Tobacco comment: smoking cessation material not required  Substance Use Topics  . Alcohol use: No    Alcohol/week: 0.0 standard drinks  . Drug use: No     Medication list has been reviewed and updated.  Current Meds  Medication Sig  . Accu-Chek Softclix Lancets lancets Use as instructed  . amLODipine (NORVASC) 10 MG tablet TAKE 1 TABLET EVERY DAY  . ammonium lactate (AMLACTIN) 12 % cream APPLY TOPICALLY AS NEEDED FOR DRY SKIN.  Marland Kitchen  aspirin 81 MG tablet Take 1 tablet by mouth daily.  . Aspirin Buf,CaCarb-MgCarb-MgO, 81 MG TABS daily  . Blood Glucose Monitoring Suppl (ACCU-CHEK AVIVA PLUS) w/Device KIT USE TWICE DAILY  . Cholecalciferol 2000 units CAPS Take 1 capsule by mouth daily.  . diclofenac sodium (VOLTAREN) 1 % GEL Apply 2 g topically 4 (four) times daily.  Marland Kitchen estradiol (ESTRACE) 0.5 MG  tablet TAKE 1 TABLET EVERY OTHER DAY  . ezetimibe (ZETIA) 10 MG tablet TAKE 1 TABLET EVERY DAY  . Ferrous Gluconate 325 (36 FE) MG TABS Take by mouth.  . fluticasone (FLONASE) 50 MCG/ACT nasal spray Place 2 sprays into both nostrils daily.  Marland Kitchen glimepiride (AMARYL) 2 MG tablet TAKE 1 TABLET EVERY DAY WITH BREAKFAST  . glucose blood (ACCU-CHEK AVIVA) test strip Use as instructed  . JANUMET 50-1000 MG tablet TAKE 1 TABLET TWICE DAILY WITH MEALS  . JARDIANCE 25 MG TABS tablet TAKE 1 TABLET EVERY DAY  . Lancet Devices (ACCU-CHEK SOFTCLIX) lancets 1 each by Other route 2 (two) times daily.  . ramipril (ALTACE) 5 MG capsule TAKE 1 CAPSULE EVERY DAY  . rosuvastatin (CRESTOR) 5 MG tablet TAKE 1 TABLET EVERY DAY  . triamcinolone cream (KENALOG) 0.1 % APPLY TOPICALLY 2 (TWO) TIMES DAILY.  Marland Kitchen triamterene-hydrochlorothiazide (MAXZIDE-25) 37.5-25 MG tablet TAKE 1 TABLET EVERY DAY   Current Facility-Administered Medications for the 03/16/19 encounter (Office Visit) with Glean Hess, MD  Medication  . cyanocobalamin ((VITAMIN B-12)) injection 1,000 mcg    PHQ 2/9 Scores 03/16/2019 03/12/2019 01/19/2019 12/22/2018  PHQ - 2 Score 2 0 0 0  PHQ- 9 Score 6 - - -    BP Readings from Last 3 Encounters:  03/16/19 138/80  01/19/19 118/78  12/22/18 128/74    Physical Exam Vitals signs and nursing note reviewed.  Constitutional:      General: She is not in acute distress.    Appearance: Normal appearance. She is well-developed. She is not ill-appearing or diaphoretic.  HENT:     Head: Normocephalic and atraumatic.  Neck:     Musculoskeletal: Normal range of motion.     Vascular: No carotid bruit.  Cardiovascular:     Rate and Rhythm: Normal rate and regular rhythm.     Pulses: Normal pulses.     Heart sounds: Normal heart sounds. No murmur.  Pulmonary:     Effort: Pulmonary effort is normal. No accessory muscle usage or respiratory distress.     Breath sounds: Normal breath sounds and air entry. No  wheezing or rhonchi.  Chest:     Chest wall: No mass, swelling, tenderness or edema.  Musculoskeletal: Normal range of motion.  Lymphadenopathy:     Cervical: No cervical adenopathy.  Skin:    General: Skin is warm and dry.     Findings: No rash.  Neurological:     Mental Status: She is alert and oriented to person, place, and time.     Cranial Nerves: Cranial nerves are intact.     Sensory: Sensation is intact.     Motor: Tremor (mild tremor right hand with activity) present.  Psychiatric:        Attention and Perception: Attention normal.        Mood and Affect: Mood normal.        Behavior: Behavior normal.        Thought Content: Thought content normal.     Wt Readings from Last 3 Encounters:  03/16/19 162 lb (73.5 kg)  01/19/19 166 lb (  75.3 kg)  12/22/18 166 lb (75.3 kg)    BP 138/80   Pulse 64   Temp 98.3 F (36.8 C) (Oral)   Ht 5' 4"  (1.626 m)   Wt 162 lb (73.5 kg)   SpO2 96%   BMI 27.81 kg/m   Assessment and Plan: 1. Chest pain, atypical Possible costochondritis that has now resolved since taking prednisone taper No s/s of chest infection - no fever, O2 sat normal, normal exam Will check CBC and continue to monitor   2. Pernicious anemia Continue monthly B12 injections - cyanocobalamin ((VITAMIN B-12)) injection 1,000 mcg - CBC with Differential/Platelet  3. Type II diabetes mellitus with complication (HCC) Clinically stable by exam and report without s/s of hypoglycemia. DM complicated by HTN, hyperlipidemia. Recent steroids have increased BS slightly - will recheck A1C in 6 weeks Tolerating medications jardiance, janumet and glimepiride well without side effects or other concerns.   4. Muscle fatigue Causing intermittent right hand tremors Pt is reassured, will continue to monitor for worsening Consider Neurology consult   Partially dictated using [place]. Any errors are unintentional.  Halina Maidens, MD [facility]  03/16/2019

## 2019-03-17 LAB — CBC WITH DIFFERENTIAL/PLATELET
Basophils Absolute: 0.1 10*3/uL (ref 0.0–0.2)
Basos: 1 %
EOS (ABSOLUTE): 0 10*3/uL (ref 0.0–0.4)
Eos: 0 %
Hematocrit: 43.9 % (ref 34.0–46.6)
Hemoglobin: 13.5 g/dL (ref 11.1–15.9)
Immature Grans (Abs): 0 10*3/uL (ref 0.0–0.1)
Immature Granulocytes: 0 %
Lymphocytes Absolute: 4.4 10*3/uL — ABNORMAL HIGH (ref 0.7–3.1)
Lymphs: 37 %
MCH: 22.2 pg — ABNORMAL LOW (ref 26.6–33.0)
MCHC: 30.8 g/dL — ABNORMAL LOW (ref 31.5–35.7)
MCV: 72 fL — ABNORMAL LOW (ref 79–97)
Monocytes Absolute: 0.7 10*3/uL (ref 0.1–0.9)
Monocytes: 6 %
Neutrophils Absolute: 6.7 10*3/uL (ref 1.4–7.0)
Neutrophils: 56 %
Platelets: 318 10*3/uL (ref 150–450)
RBC: 6.07 x10E6/uL — ABNORMAL HIGH (ref 3.77–5.28)
RDW: 15.9 % — ABNORMAL HIGH (ref 11.7–15.4)
WBC: 11.9 10*3/uL — ABNORMAL HIGH (ref 3.4–10.8)

## 2019-03-19 ENCOUNTER — Other Ambulatory Visit: Payer: Self-pay | Admitting: Internal Medicine

## 2019-03-19 DIAGNOSIS — R21 Rash and other nonspecific skin eruption: Secondary | ICD-10-CM

## 2019-03-27 ENCOUNTER — Other Ambulatory Visit: Payer: Self-pay

## 2019-03-27 NOTE — Patient Outreach (Signed)
[place] [facility]) Care Management  03/27/2019  Sheila Sandoval 21-Jan-1946 [id number]   Follow up call to patient to ensure receipt of Application for Food and Nutrition Services that was mailed on 03/13/19.  Patient did receive application and has completed it.  Provided her with mailing address for [place].  Closing social work case at this time but encouraged her to call if additional needs arise.  Ronn Melena, BSW Social Worker [phone]

## 2019-03-29 ENCOUNTER — Telehealth: Payer: Self-pay

## 2019-03-29 NOTE — Telephone Encounter (Signed)
He may not be the best fit for you.

## 2019-03-29 NOTE — Telephone Encounter (Signed)
Wants to see if you know Anthony Sar in [place] for new PCP?

## 2019-04-13 ENCOUNTER — Other Ambulatory Visit: Payer: Self-pay | Admitting: *Deleted

## 2019-04-13 NOTE — Patient Outreach (Signed)
[place] [facility]) Care Management  04/13/2019  SHONIE GIMENEZ 05-12-1946 [id number]  RN Health Coach attempted follow up outreach call to patient.  Patient was unavailable. No voice mail available Plan: RN will call patient again within 30 days.  [place] Care Management [phone]

## 2019-05-01 ENCOUNTER — Ambulatory Visit (INDEPENDENT_AMBULATORY_CARE_PROVIDER_SITE_OTHER): Payer: Medicare PPO | Admitting: Internal Medicine

## 2019-05-01 ENCOUNTER — Other Ambulatory Visit: Payer: Self-pay

## 2019-05-01 ENCOUNTER — Encounter: Payer: Self-pay | Admitting: Internal Medicine

## 2019-05-01 VITALS — BP 134/74 | HR 65 | Ht 64.0 in | Wt 165.0 lb

## 2019-05-01 DIAGNOSIS — E1169 Type 2 diabetes mellitus with other specified complication: Secondary | ICD-10-CM | POA: Diagnosis not present

## 2019-05-01 DIAGNOSIS — M4316 Spondylolisthesis, lumbar region: Secondary | ICD-10-CM

## 2019-05-01 DIAGNOSIS — E118 Type 2 diabetes mellitus with unspecified complications: Secondary | ICD-10-CM | POA: Diagnosis not present

## 2019-05-01 DIAGNOSIS — Z23 Encounter for immunization: Secondary | ICD-10-CM | POA: Diagnosis not present

## 2019-05-01 DIAGNOSIS — E785 Hyperlipidemia, unspecified: Secondary | ICD-10-CM

## 2019-05-01 DIAGNOSIS — D510 Vitamin B12 deficiency anemia due to intrinsic factor deficiency: Secondary | ICD-10-CM | POA: Diagnosis not present

## 2019-05-01 DIAGNOSIS — I10 Essential (primary) hypertension: Secondary | ICD-10-CM | POA: Diagnosis not present

## 2019-05-01 MED ORDER — CYANOCOBALAMIN 1000 MCG/ML IJ SOLN
1000.0000 ug | Freq: Once | INTRAMUSCULAR | Status: AC
  Administered 2019-05-01: 1000 ug via INTRAMUSCULAR

## 2019-05-01 NOTE — Progress Notes (Signed)
Date:  05/01/2019   Name:  Sheila Sandoval   DOB:  [birth year]   MRN:  [id number]   Chief Complaint: Diabetes (A1C.), Decreased Kidney Function (Recheck CMP), and B12 Deficiency (B12 shot and Flu shot.)  Diabetes She presents for her follow-up diabetic visit. She has type 2 diabetes mellitus. Her disease course has been stable. Pertinent negatives for hypoglycemia include no headaches or tremors. Pertinent negatives for diabetes include no chest pain, no fatigue, no polydipsia and no polyuria. Symptoms are stable. Current diabetic treatments: glimepiride, janumet and jardiance. Her weight is stable. She is following a generally healthy diet. She monitors blood glucose at home 1-2 x per day. Her breakfast blood glucose is taken between 8-9 am. Her breakfast blood glucose range is generally 130-140 mg/dl. An ACE inhibitor/angiotensin II receptor blocker is being taken. Eye exam is current.  Back Pain This is a recurrent problem. The problem occurs daily. The pain is present in the lumbar spine. The pain radiates to the left knee. The pain is mild. Pertinent negatives include no abdominal pain, chest pain, dysuria, fever, headaches or numbness. Treatments tried: tylenol. The treatment provided mild relief.  Hyperlipidemia This is a chronic problem. The problem is controlled. Pertinent negatives include no chest pain or shortness of breath. Current antihyperlipidemic treatment includes statins. The current treatment provides significant improvement of lipids. There are no compliance problems.   Hypertension This is a chronic problem. The problem is controlled. Pertinent negatives include no chest pain, headaches, palpitations or shortness of breath. Past treatments include calcium channel blockers and ACE inhibitors. The current treatment provides significant improvement. There are no compliance problems.    Lab Results  Component Value Date   HGBA1C 8.1 (H) 12/22/2018   Lab Results  Component  Value Date   CREATININE 1.22 (H) 12/22/2018   BUN 28 (H) 12/22/2018   NA 139 12/22/2018   K 4.2 12/22/2018   CL 100 12/22/2018   CO2 21 12/22/2018   Lab Results  Component Value Date   CHOL 106 12/22/2018   HDL 46 12/22/2018   LDLCALC 44 12/22/2018   TRIG 81 12/22/2018   CHOLHDL 2.3 12/22/2018    Review of Systems  Constitutional: Negative for appetite change, fatigue, fever and unexpected weight change.  HENT: Negative for tinnitus and trouble swallowing.   Eyes: Negative for visual disturbance.  Respiratory: Negative for cough, chest tightness and shortness of breath.   Cardiovascular: Negative for chest pain, palpitations and leg swelling.  Gastrointestinal: Negative for abdominal pain.  Endocrine: Negative for polydipsia and polyuria.  Genitourinary: Negative for dysuria and hematuria.  Musculoskeletal: Positive for back pain. Negative for arthralgias.  Neurological: Negative for tremors, numbness and headaches.  Psychiatric/Behavioral: Negative for dysphoric mood.    Patient Active Problem List   Diagnosis Date Noted  . Type II diabetes mellitus with complication ([place]) 07/62/2633  . Anatomical narrow angle glaucoma with borderline intraocular pressure 07/15/2017  . Vitamin D deficiency 03/11/2017  . Hypercalcemia 10/02/2016  . Pernicious anemia 04/11/2015  . Carpal tunnel syndrome 01/14/2015  . Hyperlipidemia associated with type 2 diabetes mellitus ([place]) 01/14/2015  . Essential hypertension 01/14/2015  . Climacteric 01/14/2015  . Muscle fatigue 01/14/2015  . Arthritis of hand, degenerative 01/14/2015  . GERD (gastroesophageal reflux disease) 10/29/2011  . Spondylolisthesis 10/29/2011    Allergies  Allergen Reactions  . Atorvastatin     chest tightness  . Tramadol Hcl Nausea And Vomiting    Past Surgical History:  Procedure Laterality Date  .  ABDOMINAL HYSTERECTOMY  1981  . APPENDECTOMY  1981  . REPAIR DURAL / CSF LEAK  2002    Social History    Tobacco Use  . Smoking status: Never Smoker  . Smokeless tobacco: Never Used  . Tobacco comment: smoking cessation material not required  Substance Use Topics  . Alcohol use: No    Alcohol/week: 0.0 standard drinks  . Drug use: No     Medication list has been reviewed and updated.  Current Meds  Medication Sig  . Accu-Chek Softclix Lancets lancets Use as instructed  . amLODipine (NORVASC) 10 MG tablet TAKE 1 TABLET EVERY DAY  . ammonium lactate (AMLACTIN) 12 % cream APPLY TOPICALLY AS NEEDED FOR DRY SKIN.  Marland Kitchen aspirin 81 MG tablet Take 1 tablet by mouth daily.  . Aspirin Buf,CaCarb-MgCarb-MgO, 81 MG TABS daily  . Blood Glucose Monitoring Suppl (ACCU-CHEK AVIVA PLUS) w/Device KIT USE TWICE DAILY  . Cholecalciferol 2000 units CAPS Take 1 capsule by mouth daily.  . diclofenac sodium (VOLTAREN) 1 % GEL Apply 2 g topically 4 (four) times daily.  Marland Kitchen estradiol (ESTRACE) 0.5 MG tablet TAKE 1 TABLET EVERY OTHER DAY  . ezetimibe (ZETIA) 10 MG tablet TAKE 1 TABLET EVERY DAY  . Ferrous Gluconate 325 (36 FE) MG TABS Take by mouth.  . fluticasone (FLONASE) 50 MCG/ACT nasal spray Place 2 sprays into both nostrils daily.  Marland Kitchen glimepiride (AMARYL) 2 MG tablet TAKE 1 TABLET EVERY DAY WITH BREAKFAST  . glucose blood (ACCU-CHEK AVIVA) test strip Use as instructed  . JANUMET 50-1000 MG tablet TAKE 1 TABLET TWICE DAILY WITH MEALS  . JARDIANCE 25 MG TABS tablet TAKE 1 TABLET EVERY DAY  . Lancet Devices (ACCU-CHEK SOFTCLIX) lancets 1 each by Other route 2 (two) times daily.  . ramipril (ALTACE) 5 MG capsule TAKE 1 CAPSULE EVERY DAY  . rosuvastatin (CRESTOR) 5 MG tablet TAKE 1 TABLET EVERY DAY  . triamcinolone cream (KENALOG) 0.1 % APPLY TOPICALLY 2 (TWO) TIMES DAILY.  Marland Kitchen triamterene-hydrochlorothiazide (MAXZIDE-25) 37.5-25 MG tablet TAKE 1 TABLET EVERY DAY   Current Facility-Administered Medications for the 05/01/19 encounter (Office Visit) with Glean Hess, MD  Medication  . cyanocobalamin ((VITAMIN  B-12)) injection 1,000 mcg    PHQ 2/9 Scores 05/01/2019 03/16/2019 03/12/2019 01/19/2019  PHQ - 2 Score 0 2 0 0  PHQ- 9 Score 4 6 - -    BP Readings from Last 3 Encounters:  05/01/19 134/74  03/16/19 138/80  01/19/19 118/78    Physical Exam Vitals signs and nursing note reviewed.  Constitutional:      General: She is not in acute distress.    Appearance: Normal appearance. She is well-developed.  HENT:     Head: Normocephalic and atraumatic.  Neck:     Musculoskeletal: Normal range of motion.     Vascular: No carotid bruit.  Cardiovascular:     Rate and Rhythm: Normal rate and regular rhythm.     Heart sounds: No murmur.  Pulmonary:     Effort: Pulmonary effort is normal. No respiratory distress.     Breath sounds: Normal breath sounds. No wheezing or rhonchi.  Musculoskeletal: Normal range of motion.     Lumbar back: She exhibits tenderness. She exhibits no spasm.       Back:     Right lower leg: No edema.     Left lower leg: No edema.  Lymphadenopathy:     Cervical: No cervical adenopathy.  Skin:    General: Skin is warm and  dry.     Capillary Refill: Capillary refill takes less than 2 seconds.     Findings: No rash.  Neurological:     General: No focal deficit present.     Mental Status: She is alert and oriented to person, place, and time.     Sensory: Sensation is intact.     Motor: Motor function is intact.     Coordination: Coordination is intact.     Deep Tendon Reflexes:     Reflex Scores:      Patellar reflexes are 2+ on the right side and 2+ on the left side.      Achilles reflexes are 2+ on the right side and 2+ on the left side.    Comments: SLR is negative bilaterally  Psychiatric:        Behavior: Behavior normal.        Thought Content: Thought content normal.     Wt Readings from Last 3 Encounters:  05/01/19 165 lb (74.8 kg)  03/16/19 162 lb (73.5 kg)  01/19/19 166 lb (75.3 kg)    BP 134/74   Pulse 65   Ht 5' 4" (1.626 m)   Wt 165 lb  (74.8 kg)   SpO2 97%   BMI 28.32 kg/m   Assessment and Plan: 1. Type II diabetes mellitus with complication (HCC) Clinically stable by exam and report without s/s of hypoglycemia. DM complicated by htn, lipids. Tolerating medications jardiance, janumet and amaryl, well without side effects or other concerns. - Hemoglobin M6Q - Basic metabolic panel  2. Spondylolisthesis of lumbar region Continue tylenol and heat Worsening back pain but without red flag signs at this time warrants orthopedic re-evaluation - Ambulatory referral to Orthopedic Surgery  3. Essential hypertension Clinically stable exam with well controlled BP.   Tolerating medications, amlodipine 10 mg and ramipril 5 mg, without side effects at this time. Pt to continue current regimen and low sodium diet; benefits of regular exercise as able discussed.   4. Hyperlipidemia associated with type 2 diabetes mellitus ([place]) Tolerating statin medication without side effects at this time LDL is at goal of < 70 on current dose of zetia 10 mg and crestor 5 mg Continue same therapy without change at this time.  5. Pernicious anemia - cyanocobalamin ((VITAMIN B-12)) injection 1,000 mcg  6. Need for immunization against influenza - Flu Vaccine QUAD High Dose(Fluad)   Partially dictated using Editor, commissioning. Any errors are unintentional.  Halina Maidens, MD [facility]  05/01/2019

## 2019-05-02 DIAGNOSIS — B351 Tinea unguium: Secondary | ICD-10-CM | POA: Diagnosis not present

## 2019-05-02 DIAGNOSIS — M79675 Pain in left toe(s): Secondary | ICD-10-CM | POA: Diagnosis not present

## 2019-05-02 DIAGNOSIS — M2012 Hallux valgus (acquired), left foot: Secondary | ICD-10-CM | POA: Diagnosis not present

## 2019-05-02 DIAGNOSIS — E119 Type 2 diabetes mellitus without complications: Secondary | ICD-10-CM | POA: Diagnosis not present

## 2019-05-02 LAB — BASIC METABOLIC PANEL
BUN/Creatinine Ratio: 16 (ref 12–28)
BUN: 16 mg/dL (ref 8–27)
CO2: 24 mmol/L (ref 20–29)
Calcium: 10.9 mg/dL — ABNORMAL HIGH (ref 8.7–10.3)
Chloride: 101 mmol/L (ref 96–106)
Creatinine, Ser: 1.01 mg/dL — ABNORMAL HIGH (ref 0.57–1.00)
GFR calc Af Amer: 64 mL/min/{1.73_m2} (ref >59)
GFR calc non Af Amer: 55 mL/min/{1.73_m2} — ABNORMAL LOW (ref >59)
Glucose: 155 mg/dL — ABNORMAL HIGH (ref 65–99)
Potassium: 4.5 mmol/L (ref 3.5–5.2)
Sodium: 140 mmol/L (ref 134–144)

## 2019-05-02 LAB — HEMOGLOBIN A1C
Est. average glucose Bld gHb Est-mCnc: 183 mg/dL
Hgb A1c MFr Bld: 8 % — ABNORMAL HIGH (ref 4.8–5.6)

## 2019-05-11 ENCOUNTER — Other Ambulatory Visit: Payer: Self-pay

## 2019-05-11 NOTE — Patient Outreach (Signed)
[facility]) Care Management  05/11/2019  Sheila Sandoval November 09, 1945 [id number]   Telephone Assessment     Outreach attempt #1 to patient. No answer. RN CM left HIPAA compliant voicemail message along with contact info.      Plan: RN CM will make outreach attempt to patient within one month.   [name], RN,BSN,CCM [facility] Telephonic Care Management Coordinator Direct Phone: [phone] Toll Free: [phone] Fax: [phone]

## 2019-05-11 NOTE — Patient Outreach (Signed)
[facility]) Care Management  [place]  05/11/2019   Sheila Sandoval [birth year] [id number]  Telephone Assessment   Incoming call from patient returning RN CM call. Patient vocies that she is doing well. She reports that she saw  PCP on 05/01/2019. She got flu shot during that visit. She shares that nurse called her the other day and told her to stop taking anything that had calcium in it but did not tell her why or further explain. Patient voices that she does not take any meds with calcium in it. RN CM reviewed med list with pt. and none noted. Patient concerned about nurse call and her message. Advised patient to call office and get further clarification. She voiced understanding and reported she would so so. She states that she got approved for food stamps which has helped her out a lot.  Appetite reports appetite is fair. She is eating about two meals per day. She admits to snacking and eating the "wrong" snacks. She has not checked her blood sugar this morning. She voices that she knows she has not been checking it as much as she should. She reports monitoring it 2x/day. She states that MD has not told her how often to check cbgs. RN CM reviewed with pt. How to check cbg log on meter as patient states she does not recall or write not readings. She voiced understanding. Patient reports intermittent back pain  Which has been going on for a few weeks to months. She is applying pain reliever gel and heating therapy to affected area. Patient states she was given referral to back specialist but just remembered that she had not called to follow up with them but voiced she would do so ASAP.  Encounter Medications:  Outpatient Encounter Medications as of 05/11/2019  Medication Sig Note  . Accu-Chek Softclix Lancets lancets Use as instructed   . amLODipine (NORVASC) 10 MG tablet TAKE 1 TABLET EVERY DAY   . ammonium lactate (AMLACTIN) 12 % cream APPLY TOPICALLY AS NEEDED FOR  DRY SKIN.   Marland Kitchen aspirin 81 MG tablet Take 1 tablet by mouth daily. 01/14/2015: Received from: [place]: Take by mouth.  . Aspirin Buf,CaCarb-MgCarb-MgO, 81 MG TABS daily   . Blood Glucose Monitoring Suppl (ACCU-CHEK AVIVA PLUS) w/Device KIT USE TWICE DAILY   . Cholecalciferol 2000 units CAPS Take 1 capsule by mouth daily.   . diclofenac sodium (VOLTAREN) 1 % GEL Apply 2 g topically 4 (four) times daily.   Marland Kitchen estradiol (ESTRACE) 0.5 MG tablet TAKE 1 TABLET EVERY OTHER DAY   . ezetimibe (ZETIA) 10 MG tablet TAKE 1 TABLET EVERY DAY   . Ferrous Gluconate 325 (36 FE) MG TABS Take by mouth. 09/07/2017: Taking 1 tablet by mouth once daily  . fluticasone (FLONASE) 50 MCG/ACT nasal spray Place 2 sprays into both nostrils daily.   Marland Kitchen glimepiride (AMARYL) 2 MG tablet TAKE 1 TABLET EVERY DAY WITH BREAKFAST   . glucose blood (ACCU-CHEK AVIVA) test strip Use as instructed   . JANUMET 50-1000 MG tablet TAKE 1 TABLET TWICE DAILY WITH MEALS   . JARDIANCE 25 MG TABS tablet TAKE 1 TABLET EVERY DAY   . Lancet Devices (ACCU-CHEK SOFTCLIX) lancets 1 each by Other route 2 (two) times daily.   . ramipril (ALTACE) 5 MG capsule TAKE 1 CAPSULE EVERY DAY   . rosuvastatin (CRESTOR) 5 MG tablet TAKE 1 TABLET EVERY DAY   . triamcinolone cream (KENALOG) 0.1 % APPLY TOPICALLY  2 (TWO) TIMES DAILY.   Marland Kitchen triamterene-hydrochlorothiazide (MAXZIDE-25) 37.5-25 MG tablet TAKE 1 TABLET EVERY DAY    Facility-Administered Encounter Medications as of 05/11/2019  Medication  . cyanocobalamin ((VITAMIN B-12)) injection 1,000 mcg    Functional Status:  In your present state of health, do you have any difficulty performing the following activities: 03/12/2019 12/06/2018  Hearing? Y N  Comment - declines hearing aids  Vision? Y N  Comment sometimes blurry wears glasses  Difficulty concentrating or making decisions? N N  Walking or climbing stairs? Y N  Comment pain in knees at times -  Dressing or bathing?  N N  Doing errands, shopping? N N  Preparing Food and eating ? N N  Using the Toilet? N N  In the past six months, have you accidently leaked urine? N N  Do you have problems with loss of bowel control? N N  Managing your Medications? N N  Managing your Finances? N N  Housekeeping or managing your Housekeeping? N N  Some recent data might be hidden    Fall/Depression Screening: Fall Risk  03/12/2019 01/19/2019 12/22/2018  Falls in the past year? 0 0 0  Comment - - -  Number falls in past yr: 0 0 0  Injury with Fall? 0 0 0  Comment - - -  Risk Factor Category  Low Risk (0 Points) - -  Comment - - -  Risk for fall due to : Impaired balance/gait;Impaired mobility;History of fall(s) - -  Risk for fall due to: Comment - - -  Follow up Falls evaluation completed;Education provided Falls evaluation completed Falls evaluation completed   PHQ 2/9 Scores 05/01/2019 03/16/2019 03/12/2019 01/19/2019 12/22/2018 12/06/2018 07/25/2018  PHQ - 2 Score 0 2 0 0 0 0 0  PHQ- 9 Score 4 6 - - - - -     Plan: RN CM will make outreach attempt to patient within one month.  [name], RN,BSN,CCM [place] Telephonic Care Management Coordinator Direct Phone: [phone] Toll Free: [phone] Fax: [phone]

## 2019-05-12 DIAGNOSIS — L600 Ingrowing nail: Secondary | ICD-10-CM | POA: Diagnosis not present

## 2019-05-14 ENCOUNTER — Ambulatory Visit: Payer: Self-pay | Admitting: *Deleted

## 2019-05-14 ENCOUNTER — Ambulatory Visit: Payer: Self-pay

## 2019-05-18 ENCOUNTER — Other Ambulatory Visit: Payer: Self-pay

## 2019-05-18 DIAGNOSIS — R21 Rash and other nonspecific skin eruption: Secondary | ICD-10-CM

## 2019-05-18 MED ORDER — TRIAMCINOLONE ACETONIDE 0.1 % EX CREA: TOPICAL_CREAM | Freq: Two times a day (BID) | CUTANEOUS | 1 refills | Status: DC

## 2019-05-23 DIAGNOSIS — M48062 Spinal stenosis, lumbar region with neurogenic claudication: Secondary | ICD-10-CM | POA: Diagnosis not present

## 2019-06-01 ENCOUNTER — Other Ambulatory Visit: Payer: Self-pay | Admitting: Internal Medicine

## 2019-06-01 ENCOUNTER — Other Ambulatory Visit: Payer: Self-pay

## 2019-06-01 ENCOUNTER — Encounter: Payer: Self-pay | Admitting: Internal Medicine

## 2019-06-01 ENCOUNTER — Ambulatory Visit (INDEPENDENT_AMBULATORY_CARE_PROVIDER_SITE_OTHER): Payer: Medicare PPO | Admitting: Internal Medicine

## 2019-06-01 VITALS — Temp 98.4°F | Ht 64.0 in | Wt 158.4 lb

## 2019-06-01 DIAGNOSIS — J0100 Acute maxillary sinusitis, unspecified: Secondary | ICD-10-CM

## 2019-06-01 MED ORDER — AMOXICILLIN 875 MG PO TABS: 875.0000 mg | ORAL_TABLET | Freq: Two times a day (BID) | ORAL | 0 refills | Status: AC

## 2019-06-01 NOTE — Progress Notes (Signed)
Date:  06/01/2019   Name:  Sheila Sandoval   DOB:  03-Dec-1945   MRN:  [id number]   Chief Complaint: Sinusitis (Blood sugar- 155. Drainage in throat. Sinus congestion- feels like when she tries to swallow her food it gets stuck in her throat. Woke up this morning feeling weak and having hard time getting out of bed. Cough- hurts in your chest ( soreness ) )  Cough This is a new problem. The current episode started in the past 7 days. The problem has been gradually worsening. The problem occurs every few minutes. The cough is non-productive. Associated symptoms include chills, nasal congestion, postnasal drip, rhinorrhea and wheezing. Pertinent negatives include no chest pain, fever, headaches or sore throat (but throat feels swollen). Exacerbated by: more pain in chest when leaning over.    Review of Systems  Constitutional: Positive for chills. Negative for fever.  HENT: Positive for congestion, postnasal drip, rhinorrhea and voice change (hoarseness). Negative for sore throat (but throat feels swollen).   Respiratory: Positive for cough and wheezing.   Cardiovascular: Negative for chest pain and palpitations.  Gastrointestinal: Positive for constipation. Negative for diarrhea, nausea and vomiting.  Neurological: Negative for dizziness, light-headedness and headaches.    Patient Active Problem List   Diagnosis Date Noted  . Type II diabetes mellitus with complication ([place]) 81/19/1478  . Anatomical narrow angle glaucoma with borderline intraocular pressure 07/15/2017  . Vitamin D deficiency 03/11/2017  . Hypercalcemia 10/02/2016  . Pernicious anemia 04/11/2015  . Carpal tunnel syndrome 01/14/2015  . Hyperlipidemia associated with type 2 diabetes mellitus ([place]) 01/14/2015  . Essential hypertension 01/14/2015  . Climacteric 01/14/2015  . Muscle fatigue 01/14/2015  . Arthritis of hand, degenerative 01/14/2015  . GERD (gastroesophageal reflux disease) 10/29/2011  .  Spondylolisthesis 10/29/2011    Allergies  Allergen Reactions  . Atorvastatin     chest tightness  . Tramadol Hcl Nausea And Vomiting    Past Surgical History:  Procedure Laterality Date  . ABDOMINAL HYSTERECTOMY  1981  . APPENDECTOMY  1981  . REPAIR DURAL / CSF LEAK  2002    Social History   Tobacco Use  . Smoking status: Never Smoker  . Smokeless tobacco: Never Used  . Tobacco comment: smoking cessation material not required  Substance Use Topics  . Alcohol use: No    Alcohol/week: 0.0 standard drinks  . Drug use: No     Medication list has been reviewed and updated.  Current Meds  Medication Sig  . Accu-Chek Softclix Lancets lancets Use as instructed  . ammonium lactate (AMLACTIN) 12 % cream APPLY TOPICALLY AS NEEDED FOR DRY SKIN.  Marland Kitchen aspirin 81 MG tablet Take 1 tablet by mouth daily.  . Aspirin Buf,CaCarb-MgCarb-MgO, 81 MG TABS daily  . Blood Glucose Monitoring Suppl (ACCU-CHEK AVIVA PLUS) w/Device KIT USE TWICE DAILY  . Cholecalciferol 2000 units CAPS Take 1 capsule by mouth daily.  . diclofenac sodium (VOLTAREN) 1 % GEL Apply 2 g topically 4 (four) times daily.  Marland Kitchen estradiol (ESTRACE) 0.5 MG tablet TAKE 1 TABLET EVERY OTHER DAY  . Ferrous Gluconate 325 (36 FE) MG TABS Take by mouth.  . fluticasone (FLONASE) 50 MCG/ACT nasal spray Place 2 sprays into both nostrils daily.  Marland Kitchen glimepiride (AMARYL) 2 MG tablet TAKE 1 TABLET EVERY DAY WITH BREAKFAST  . glucose blood (ACCU-CHEK AVIVA) test strip Use as instructed  . JANUMET 50-1000 MG tablet TAKE 1 TABLET TWICE DAILY WITH MEALS  . Lancet Devices (ACCU-CHEK SOFTCLIX) lancets  1 each by Other route 2 (two) times daily.  . rosuvastatin (CRESTOR) 5 MG tablet TAKE 1 TABLET EVERY DAY  . triamcinolone cream (KENALOG) 0.1 % Apply topically 2 (two) times daily.  . [DISCONTINUED] amLODipine (NORVASC) 10 MG tablet TAKE 1 TABLET EVERY DAY  . [DISCONTINUED] ezetimibe (ZETIA) 10 MG tablet TAKE 1 TABLET EVERY DAY  . [DISCONTINUED]  JARDIANCE 25 MG TABS tablet TAKE 1 TABLET EVERY DAY  . [DISCONTINUED] ramipril (ALTACE) 5 MG capsule TAKE 1 CAPSULE EVERY DAY  . [DISCONTINUED] triamterene-hydrochlorothiazide (MAXZIDE-25) 37.5-25 MG tablet TAKE 1 TABLET EVERY DAY   Current Facility-Administered Medications for the 06/01/19 encounter (Office Visit) with Glean Hess, MD  Medication  . cyanocobalamin ((VITAMIN B-12)) injection 1,000 mcg    PHQ 2/9 Scores 06/01/2019 05/01/2019 03/16/2019 03/12/2019  PHQ - 2 Score 3 0 2 0  PHQ- 9 Score 5 4 6  -    BP Readings from Last 3 Encounters:  05/01/19 134/74  03/16/19 138/80  01/19/19 118/78    Physical Exam Pulmonary:     Comments: No cough or dyspnea noted during the call Voice is raspy and has a nasal quality Neurological:     Mental Status: She is alert.  Psychiatric:        Attention and Perception: Attention normal.        Mood and Affect: Mood normal.        Speech: Speech normal.        Cognition and Memory: Cognition normal.     Wt Readings from Last 3 Encounters:  06/01/19 158 lb 6.4 oz (71.8 kg)  05/01/19 165 lb (74.8 kg)  03/16/19 162 lb (73.5 kg)    Temp 98.4 F (36.9 C) (Oral)   Ht 5' 4"  (1.626 m)   Wt 158 lb 6.4 oz (71.8 kg)   BMI 27.19 kg/m   Assessment and Plan: 1. Acute non-recurrent maxillary sinusitis Continue Flonase, increase fluid intake Follow up if no improvement - amoxicillin (AMOXIL) 875 MG tablet; Take 1 tablet (875 mg total) by mouth 2 (two) times daily for 10 days.  Dispense: 20 tablet; Refill: 0  I spend 10 minutes on this encounter. Partially dictated using Editor, commissioning. Any errors are unintentional.  Halina Maidens, MD [facility]  06/01/2019

## 2019-06-05 NOTE — Telephone Encounter (Signed)
This encounter was created in error - please disregard.

## 2019-06-06 ENCOUNTER — Telehealth: Payer: Self-pay

## 2019-06-06 NOTE — Telephone Encounter (Signed)
She should get Delsym over the counter as needed.

## 2019-06-06 NOTE — Telephone Encounter (Signed)
Patient requesting advice from Dr. Army Melia.   Called saying she has been taking her abx prescribed and it is helping but she has now developed a cough. The cough causes her ribs to feel sore, and when she breathe's in cold air it causes her to become hoarse in her voice.   Is there anything we can prescribe for patients cough with abx - amoxicillin?  Benedict Needy, CMA

## 2019-06-06 NOTE — Telephone Encounter (Signed)
Called and informed patient to continue amoxicillin and to get some Delsym OTC.

## 2019-06-07 ENCOUNTER — Ambulatory Visit: Payer: Self-pay

## 2019-06-07 ENCOUNTER — Other Ambulatory Visit: Payer: Self-pay

## 2019-06-07 ENCOUNTER — Ambulatory Visit: Payer: Self-pay | Admitting: *Deleted

## 2019-06-07 NOTE — Patient Outreach (Addendum)
[facility]) Care Management  06/07/2019  Sheila Sandoval 10-Jun-1946 [id number]   Telephone Assessment   Outreach call back to patient per her previous request. Spoke with patient who denies any acute issues or concerns at present. She shares that she is currently on day 5 out 10 day abx therapy. She was having some resp sxs-including sore throat and cramping/bloating. She voices since starting antibiotic therapy she can already tell an improvement in her sxs and is feeling better. RN Cm discussed with pt importance of completing abx therapy even though she is feeling better and she voiced understanding. Also reviewed with patient how sickness/stress on the boy impacts cbgs. Patient stet that lately her cbgs have been lower and more normal. Blood sugar this morning was reported to be 148. Appetite per pt report is "too good." She voices adhering to diabetic diet and shares that she does not like desserts like most people. RN CM discussed with patient upcoming holidays and importance of adhering to diet restrictions. She reports that she did go to back clinic to be evaluated. She was given steroids to take to help but states she never took them. She voices that sxs improved on their won and she doesn't like to take steroids so she didn't take any. She reports she foes for PCP follow up appt on 06/28/2019 but is aware to contact MD for any worsening and/or unrelieved sxs. She denies any RN CM needs or concerns at this time.   Medications Reviewed Today    Reviewed by Hayden Pedro, RN (Registered Nurse) on 06/07/19 at 1242  Med List Status: <None>  Medication Order Taking? Sig Documenting Provider Last Dose Status Informant  Accu-Chek Softclix Lancets lancets [id number] No Use as instructed Glean Hess, MD Taking Active   amLODipine (NORVASC) 10 MG tablet [id number]  TAKE 1 TABLET EVERY DAY Glean Hess, MD  Active   ammonium lactate (AMLACTIN) 12 % cream  [id number] No APPLY TOPICALLY AS NEEDED FOR DRY SKIN. Glean Hess, MD Taking Active   amoxicillin (AMOXIL) 875 MG tablet [id number]  Take 1 tablet (875 mg total) by mouth 2 (two) times daily for 10 days. Glean Hess, MD  Active   aspirin 81 MG tablet [id number] No Take 1 tablet by mouth daily. [provider] Taking Active            Med Note Quentin Cornwall, DONNA L   Tue Jan 14, 2015 11:02 AM) Received from: [place]: Take by mouth.  Aspirin Buf,CaCarb-MgCarb-MgO, 81 MG TABS [id number] No daily [provider] Taking Active   Blood Glucose Monitoring Suppl (ACCU-CHEK AVIVA PLUS) w/Device KIT [id number] No USE TWICE DAILY Glean Hess, MD Taking Active   Cholecalciferol 2000 units CAPS [id number] No Take 1 capsule by mouth daily. [provider] Taking Active   cyanocobalamin ((VITAMIN B-12)) injection 1,000 mcg [id number]   Juline Patch, MD  Active   diclofenac sodium (VOLTAREN) 1 % GEL [id number] No Apply 2 g topically 4 (four) times daily. Glean Hess, MD Taking Active   estradiol (ESTRACE) 0.5 MG tablet [id number] No TAKE 1 TABLET EVERY OTHER DAY Glean Hess, MD Taking Active   ezetimibe (ZETIA) 10 MG tablet [id number]  TAKE 1 TABLET EVERY DAY Glean Hess, MD  Active   Ferrous Gluconate 325 (36 FE) MG TABS [id number] No Take by mouth. [provider] Taking Active  Med Note [facility], ELISABETH A   Wed Sep 07, 2017 11:13 AM) Taking 1 tablet by mouth once daily  fluticasone (FLONASE) 50 MCG/ACT nasal spray [id number] No Place 2 sprays into both nostrils daily. Glean Hess, MD Taking Active   glimepiride (AMARYL) 2 MG tablet [id number] No TAKE 1 TABLET EVERY DAY WITH BREAKFAST Glean Hess, MD Taking Active   glucose blood (ACCU-CHEK AVIVA) test strip [id number] No Use as instructed Glean Hess, MD Taking Active   JANUMET 50-1000 MG tablet [id number] No TAKE 1 TABLET TWICE DAILY WITH  MEALS Glean Hess, MD Taking Active   JARDIANCE 25 MG TABS tablet [id number]  TAKE 1 TABLET EVERY DAY Glean Hess, MD  Active   Lancet Devices [facility]) lancets [id number] No 1 each by Other route 2 (two) times daily. Glean Hess, MD Taking Active   ramipril (ALTACE) 5 MG capsule [id number]  TAKE 1 CAPSULE EVERY DAY Glean Hess, MD  Active   rosuvastatin (CRESTOR) 5 MG tablet [id number] No TAKE 1 TABLET EVERY DAY Glean Hess, MD Taking Active   triamcinolone cream (KENALOG) 0.1 % [id number] No Apply topically 2 (two) times daily. Glean Hess, MD Taking Active   triamterene-hydrochlorothiazide [facility]) 37.5-25 MG tablet [id number]  TAKE 1 TABLET EVERY DAY Glean Hess, MD  Active          [facility] CM Care Plan Problem One     Most Recent Value  Care Plan Problem One  Knowledge deficit related to disease process and mgmt of Diabetes.  Role Documenting the Problem One  Care Management Telephonic [place] for Problem One  Active  [facility] Long Term Goal   Patient will report a deccease in A1C level from 8.1 within the next 90 days.  [facility] Long Term Goal Start Date  05/11/19  [facility] CM Short Term Goal #1   Patient will report monitoring and recording cbgs as ordered by MD in the home over the next 30 days.  [facility] CM Short Term Goal #1 Start Date  05/11/19  [facility] CM Short Term Goal #1 Met Date  06/07/19  [facility] CM Short Term Goal #2   Patient will report adhering to diabetic diet at least 85-100% of the time over the next 30 days.  [facility]N CM Short Term Goal #2 Start Date  05/11/19  [facility] CM Short Term Goal #2 Met Date  06/07/19    [facility] CM Care Plan Problem Two     Most Recent Value  Care Plan Problem Two  Patient experiencing pain to back at times.  Role Documenting the Problem Two  Care Management [place] for Problem Two  Active  THN CM Short Term Goal #1   Patient will report having made an appt with back specialist over the next  30 days.  [facility]N CM Short Term Goal #1 Start Date  05/11/19  [facility] CM Short Term Goal #1 Met Date   06/07/19    [facility] CM Care Plan Problem Three     Most Recent Value  Care Plan Problem Three  Patient with resp infection.  Role Documenting the Problem Three  Care Management Telephonic Coordinator  Care Plan for Problem Three  Active  THN CM Short Term Goal #1   Patient will report completing antibiotic therapy and have resolution of symptoms over the next 30 days.  [facility]N CM Short Term Goal #1 Start Date  06/07/19  Interventions for Short Term Goal #  1  RN CM discussed s/s of worsening condition and when to seek medical attention. RNCM educated pt. on importance of completing abx therapy. RNCM discussed with pt sickness/infection and how it relates to possible changes in cbgs.      Plan: RN CM discussed with patient next outreach within a month. Patient gave verbal consent and in agreement with RN CM follow up timeframe. Patient aware that they may contact RN CM sooner for any issues or concerns. RN CM will send quarterly update to PCP.  [name], RN,BSN,CCM [place] Management Telephonic Care Management Coordinator Direct Phone: [phone] Toll Free: [phone] Fax: [phone]

## 2019-06-07 NOTE — Patient Outreach (Signed)
[facility]) Care Management  06/07/2019  Sheila Sandoval 07-05-1946 [id number]   Telephone Assessment    Outreach attempt #1 to patient. Spoke with patient who reported she could not talk at present and requested call back within an hour or so.      Plan: RN CM will make outreach attempt to patient later today.   [name], RN,BSN,CCM [place] Management Telephonic Care Management Coordinator Direct Phone: [phone] Toll Free: [phone] Fax: [phone]

## 2019-06-11 ENCOUNTER — Ambulatory Visit: Payer: Self-pay

## 2019-06-15 DIAGNOSIS — H527 Unspecified disorder of refraction: Secondary | ICD-10-CM | POA: Diagnosis not present

## 2019-06-15 DIAGNOSIS — H2513 Age-related nuclear cataract, bilateral: Secondary | ICD-10-CM | POA: Diagnosis not present

## 2019-06-15 DIAGNOSIS — H18519 Endothelial corneal dystrophy, unspecified eye: Secondary | ICD-10-CM | POA: Diagnosis not present

## 2019-06-15 DIAGNOSIS — E119 Type 2 diabetes mellitus without complications: Secondary | ICD-10-CM | POA: Diagnosis not present

## 2019-06-15 LAB — HM DIABETES EYE EXAM

## 2019-06-16 ENCOUNTER — Encounter: Payer: Self-pay | Admitting: Internal Medicine

## 2019-06-18 ENCOUNTER — Other Ambulatory Visit: Payer: Self-pay

## 2019-06-18 ENCOUNTER — Ambulatory Visit (INDEPENDENT_AMBULATORY_CARE_PROVIDER_SITE_OTHER): Payer: Medicare PPO

## 2019-06-18 DIAGNOSIS — E538 Deficiency of other specified B group vitamins: Secondary | ICD-10-CM

## 2019-06-19 ENCOUNTER — Other Ambulatory Visit: Payer: Self-pay | Admitting: Internal Medicine

## 2019-07-02 ENCOUNTER — Telehealth: Payer: Self-pay

## 2019-07-02 ENCOUNTER — Other Ambulatory Visit: Payer: Self-pay

## 2019-07-02 NOTE — Telephone Encounter (Signed)
Pt called stating she wants to know if she can have metaxalone sent in for her muscle spasms in her back. Explained I do not see where Dr Army Melia have prescribed this before and that we would need to see her in the clinic before Dr Army Melia can prescribe anything new.   She said she will wait it out and if it gets worse she will callback and schedule an appt.   Benedict Needy, CMA

## 2019-07-03 ENCOUNTER — Ambulatory Visit: Payer: Medicare PPO | Admitting: Internal Medicine

## 2019-07-04 ENCOUNTER — Other Ambulatory Visit: Payer: Self-pay

## 2019-07-04 NOTE — Patient Outreach (Signed)
[facility]) Care Management  07/04/2019  TRIA DUCOMMUN 08-23-1945 [id number]   Telephone Assessment    Outreach attempt #1 to patient. No answer. RN CM left HIPAA compliant voicemail message along with contact info.     Plan: RN CM will make outreach attempt to patient within the month of January if no return call from patient.   [name], RN,BSN,CCM [place] Management Telephonic Care Management Coordinator Direct Phone: [phone] Toll Free: [phone] Fax: [phone]

## 2019-07-05 ENCOUNTER — Ambulatory Visit: Payer: Self-pay

## 2019-07-11 ENCOUNTER — Other Ambulatory Visit: Payer: Self-pay | Admitting: Internal Medicine

## 2019-07-11 DIAGNOSIS — R21 Rash and other nonspecific skin eruption: Secondary | ICD-10-CM

## 2019-07-18 ENCOUNTER — Other Ambulatory Visit: Payer: Self-pay | Admitting: Internal Medicine

## 2019-07-18 DIAGNOSIS — E782 Mixed hyperlipidemia: Secondary | ICD-10-CM

## 2019-07-30 ENCOUNTER — Telehealth: Payer: Self-pay

## 2019-07-30 NOTE — Telephone Encounter (Signed)
Pt called saying she is having issues with her skin peeling off the bottoms of her feet. She said she called her podiatrist and they told her to call her PCP.  Please advise???

## 2019-07-30 NOTE — Telephone Encounter (Signed)
I can look at her feet the next time she comes in.  It does not sound like a real issue unless she has bleeding.

## 2019-07-30 NOTE — Telephone Encounter (Signed)
Called and left VM informing patient of this. Told her to callback and make an appt if she is having peeling and bleeding to be seen sooner than her f/up.

## 2019-08-03 DIAGNOSIS — Z803 Family history of malignant neoplasm of breast: Secondary | ICD-10-CM | POA: Diagnosis not present

## 2019-08-03 DIAGNOSIS — Z1231 Encounter for screening mammogram for malignant neoplasm of breast: Secondary | ICD-10-CM | POA: Diagnosis not present

## 2019-08-06 ENCOUNTER — Other Ambulatory Visit: Payer: Self-pay

## 2019-08-06 NOTE — Patient Outreach (Signed)
[name]) Care Management  08/06/2019  Sheila Sandoval [birth year] [id number]   Telephone Assessment    Outreach attempt #2 to patient. No answer at present. RN CM left HIPAA compliant voicemail message along with contact info.     Plan: RN CM will make outreach attempt to patient within the month of February.    [name], RN,BSN,CCM [place] Management Telephonic Care Management Coordinator Direct Phone: [phone] Toll Free: [phone] Fax: [phone]

## 2019-08-08 ENCOUNTER — Ambulatory Visit: Payer: Self-pay

## 2019-08-30 ENCOUNTER — Ambulatory Visit: Payer: Self-pay | Admitting: Internal Medicine

## 2019-09-03 ENCOUNTER — Other Ambulatory Visit: Payer: Self-pay

## 2019-09-03 NOTE — Patient Outreach (Signed)
[facility]) Care Management  09/03/2019  Sheila Sandoval [birth year] [id number]   Telephone Assessment    Outreach attempt to patient. Spoke with patient. She states she has been doing fairly well. Patient voices that she thinks she may be suffering from arthritis to her legs. She complains of some occasional stiffness. She voices that she plans to make an appt with MD to be evaluated. Appetite remains good for patient. She reports cbgs are WNL. She has not had a chance yet to check blood sugar. Patient reports that she received the Moderna COVID-vaccine on 08/30/2019. She denies any adverse side effects.    THN CM Care Plan Problem One     Most Recent Value  Care Plan Problem One  Knowledge deficit related to disease process and mgmt of Diabetes.  Role Documenting the Problem One  Care Management Telephonic [place] for Problem One  Active  [facility] Long Term Goal   Patient will report a deccease in A1C level from 8.1 within the next 90 days.  [facility]N Long Term Goal Start Date  05/11/19  Interventions for Problem One Long Term Goal  RN CM assessed for cbg monitoring and recording. RN CM discussed value with pt. RN CM reinforced diabetic education to pt.    THN CM Care Plan Problem Three     Most Recent Value  Care Plan for Problem Three  Not Active  [facility]N CM Short Term Goal #1   Patient will report completing antibiotic therapy and have resolution of symptoms over the next 30 days.  [facility]N CM Short Term Goal #1 Start Date  06/07/19  [facility] CM Short Term Goal #1 Met Date  09/03/19      Plan: RN CM discussed with patient next outreach within the month of March. Patient gave verbal consent and in agreement with RN CM follow up and timeframe. Patient aware that they may contact RN CM sooner for any issues or concerns.    [name], RN,BSN,CCM [place] Management Telephonic Care Management Coordinator Direct Phone: [phone] Toll Free:  [phone] Fax: [phone]

## 2019-09-05 ENCOUNTER — Ambulatory Visit: Payer: Self-pay

## 2019-09-14 ENCOUNTER — Other Ambulatory Visit: Payer: Self-pay | Admitting: Internal Medicine

## 2019-09-19 ENCOUNTER — Other Ambulatory Visit: Payer: Self-pay

## 2019-09-19 ENCOUNTER — Ambulatory Visit (INDEPENDENT_AMBULATORY_CARE_PROVIDER_SITE_OTHER): Payer: Medicare PPO

## 2019-09-19 DIAGNOSIS — E538 Deficiency of other specified B group vitamins: Secondary | ICD-10-CM | POA: Diagnosis not present

## 2019-09-19 MED ORDER — CYANOCOBALAMIN 1000 MCG/ML IJ SOLN
1000.0000 ug | Freq: Once | INTRAMUSCULAR | Status: AC
  Administered 2019-09-19: 1000 ug via INTRAMUSCULAR

## 2019-09-24 ENCOUNTER — Telehealth: Payer: Self-pay

## 2019-09-24 NOTE — Telephone Encounter (Signed)
Patient called saying she sprayed some perfume on her chest this morning and broke out in a rash on her chest. No swelling. Just a itching rash.  Told her to take a antihistamine over the counter to help with itching and reaction. If it does not go away told her to call me back and let me know.  CM

## 2019-10-03 ENCOUNTER — Other Ambulatory Visit: Payer: Self-pay

## 2019-10-03 NOTE — Patient Outreach (Signed)
[facility]) Care Management  10/03/2019  Sheila Sandoval 10/07/1945 [id number]   Telephone Assessment     Outreach attempt # 1 to patient. No answer at present.   Plan: RN CM will make outreach attempt to patient within the month of April if no return call.    [name], RN,BSN,CCM [place] Management Telephonic Care Management Coordinator Direct Phone: [phone] Toll Free: [phone] Fax: [phone]

## 2019-10-04 ENCOUNTER — Other Ambulatory Visit: Payer: Self-pay | Admitting: Internal Medicine

## 2019-10-04 ENCOUNTER — Ambulatory Visit: Payer: Self-pay

## 2019-10-09 ENCOUNTER — Telehealth: Payer: Self-pay

## 2019-10-09 NOTE — Telephone Encounter (Signed)
There is no lower dose or similar alternative that will be covered.  I recommend taper the current supply to every third day until gone, then stop.

## 2019-10-09 NOTE — Telephone Encounter (Signed)
Patient called saying her Sheila Sandoval called to let her know she can no longer take estradiol per Insurance due to her age. They stopped her coverage for this and was told to call Dr B and ask for a lower dose or a different medication in place of this.  Please advise.  CM

## 2019-10-09 NOTE — Telephone Encounter (Signed)
Tried calling pt and VM is not yet set up yet. Will try again.

## 2019-10-10 NOTE — Telephone Encounter (Signed)
Spoke with patient. She has already stopped taking estradiol. She is going to try over the counter black cohosh to help with hot flashes. She also mentioned she has tremors on her right side and her right hand.   Told her we can discuss it at her upcoming appt per Dr Army Melia but if I gets worse before then to come in sooner to be evaluated.  CM

## 2019-10-17 ENCOUNTER — Telehealth: Payer: Self-pay

## 2019-10-17 NOTE — Telephone Encounter (Signed)
Patient called saying she is having difficulty getting out of bed in the morning and her joints are stiff. Wondered if this could come from stopping estradiol.  Patient informed stopping estradiol could not cause these symptoms. Told her if this gets worse come in and we may need to do a arthritis work up on her.  She verbalized understanding.  CM

## 2019-11-05 ENCOUNTER — Other Ambulatory Visit: Payer: Self-pay

## 2019-11-05 NOTE — Patient Outreach (Signed)
[facility]) Care Management  11/05/2019  Sheila Sandoval 05/28/1946 [id number]   Telephone Assessment    Outreach attempt to patient. Spoke briefly with patient as she reported she was on another important call and requested call back at another time.       Plan: RN CM will make outreach attempt to patient within the month of May.     [name], RN,BSN,CCM [place] Management Telephonic Care Management Coordinator Direct Phone: [phone] Toll Free: [phone] Fax: [phone]

## 2019-11-09 ENCOUNTER — Ambulatory Visit: Payer: Medicare PPO | Admitting: Internal Medicine

## 2019-11-09 ENCOUNTER — Ambulatory Visit: Payer: Self-pay

## 2019-11-13 ENCOUNTER — Encounter: Payer: Self-pay | Admitting: Internal Medicine

## 2019-11-13 ENCOUNTER — Other Ambulatory Visit: Payer: Self-pay

## 2019-11-13 ENCOUNTER — Ambulatory Visit: Payer: Medicare PPO | Admitting: Internal Medicine

## 2019-11-13 VITALS — BP 140/78 | HR 106 | Temp 98.4°F | Ht 64.0 in | Wt 164.0 lb

## 2019-11-13 DIAGNOSIS — M4316 Spondylolisthesis, lumbar region: Secondary | ICD-10-CM | POA: Diagnosis not present

## 2019-11-13 DIAGNOSIS — E538 Deficiency of other specified B group vitamins: Secondary | ICD-10-CM | POA: Diagnosis not present

## 2019-11-13 DIAGNOSIS — E118 Type 2 diabetes mellitus with unspecified complications: Secondary | ICD-10-CM

## 2019-11-13 DIAGNOSIS — R21 Rash and other nonspecific skin eruption: Secondary | ICD-10-CM

## 2019-11-13 DIAGNOSIS — E785 Hyperlipidemia, unspecified: Secondary | ICD-10-CM | POA: Diagnosis not present

## 2019-11-13 DIAGNOSIS — G5603 Carpal tunnel syndrome, bilateral upper limbs: Secondary | ICD-10-CM

## 2019-11-13 DIAGNOSIS — I10 Essential (primary) hypertension: Secondary | ICD-10-CM

## 2019-11-13 DIAGNOSIS — E1169 Type 2 diabetes mellitus with other specified complication: Secondary | ICD-10-CM

## 2019-11-13 LAB — POCT URINALYSIS DIPSTICK
Bilirubin, UA: NEGATIVE
Blood, UA: NEGATIVE
Glucose, UA: POSITIVE — AB
Ketones, UA: NEGATIVE
Leukocytes, UA: NEGATIVE
Nitrite, UA: NEGATIVE
Protein, UA: NEGATIVE
Spec Grav, UA: 1.015 (ref 1.010–1.025)
Urobilinogen, UA: 0.2 E.U./dL
pH, UA: 6.5 (ref 5.0–8.0)

## 2019-11-13 MED ORDER — NABUMETONE 750 MG PO TABS: 750.0000 mg | ORAL_TABLET | Freq: Every evening | ORAL | 1 refills | Status: DC | PRN

## 2019-11-13 MED ORDER — CYANOCOBALAMIN 1000 MCG/ML IJ SOLN
1000.0000 ug | Freq: Once | INTRAMUSCULAR | Status: AC
  Administered 2019-11-13: 1000 ug via INTRAMUSCULAR

## 2019-11-13 MED ORDER — TRIAMCINOLONE ACETONIDE 0.1 % EX CREA: TOPICAL_CREAM | Freq: Two times a day (BID) | CUTANEOUS | 1 refills | Status: DC

## 2019-11-13 NOTE — Progress Notes (Signed)
Date:  11/13/2019   Name:  Sheila Sandoval   DOB:  04-10-1946   MRN:  [id number]   Chief Complaint: Diabetes (4 month follow up. B12), Urinary Tract Infection (x2-3 weeks frequency at night and pressure and sore pelvic area), and Tremors (right hand shaking more than usual )  Diabetes She presents for her follow-up diabetic visit. She has type 2 diabetes mellitus. Her disease course has been stable. Hypoglycemia symptoms include tremors (intention tremor in right hand intermittent). Pertinent negatives for hypoglycemia include no headaches. Pertinent negatives for diabetes include no chest pain, no fatigue, no polydipsia and no polyuria. She monitors blood glucose at home 1-2 x per week. Her breakfast blood glucose is taken between 6-7 am. Her breakfast blood glucose range is generally 130-140 mg/dl. An ACE inhibitor/angiotensin II receptor blocker is being taken. Eye exam is current.  Hypertension This is a chronic problem. The problem is controlled. Associated symptoms include palpitations (rapid heart beat 20-30 minutes triggered by activity). Pertinent negatives include no chest pain, headaches or shortness of breath.  Hyperlipidemia This is a chronic problem. Associated symptoms include myalgias. Pertinent negatives include no chest pain or shortness of breath. Current antihyperlipidemic treatment includes statins and ezetimibe.  Urinary Tract Infection  Associated symptoms include frequency. Pertinent negatives include no hematuria.    Lab Results  Component Value Date   CREATININE 1.01 (H) 05/01/2019   BUN 16 05/01/2019   NA 140 05/01/2019   K 4.5 05/01/2019   CL 101 05/01/2019   CO2 24 05/01/2019   Lab Results  Component Value Date   CHOL 106 12/22/2018   HDL 46 12/22/2018   LDLCALC 44 12/22/2018   TRIG 81 12/22/2018   CHOLHDL 2.3 12/22/2018   Lab Results  Component Value Date   TSH 1.710 12/22/2018   Lab Results  Component Value Date   HGBA1C 8.0 (H) 05/01/2019    Lab Results  Component Value Date   WBC 11.9 (H) 03/16/2019   HGB 13.5 03/16/2019   HCT 43.9 03/16/2019   MCV 72 (L) 03/16/2019   PLT 318 03/16/2019   Lab Results  Component Value Date   ALT 18 12/22/2018   AST 16 12/22/2018   ALKPHOS 77 12/22/2018   BILITOT 0.4 12/22/2018     Review of Systems  Constitutional: Negative for appetite change, fatigue, fever and unexpected weight change.  HENT: Negative for tinnitus and trouble swallowing.   Eyes: Negative for visual disturbance.  Respiratory: Negative for cough, chest tightness and shortness of breath.   Cardiovascular: Positive for palpitations (rapid heart beat 20-30 minutes triggered by activity). Negative for chest pain and leg swelling.  Gastrointestinal: Negative for abdominal pain.  Endocrine: Negative for polydipsia and polyuria.  Genitourinary: Positive for frequency. Negative for dysuria and hematuria.  Musculoskeletal: Positive for back pain and myalgias. Negative for arthralgias.  Neurological: Positive for tremors (intention tremor in right hand intermittent). Negative for numbness and headaches.  Psychiatric/Behavioral: Negative for dysphoric mood.    Patient Active Problem List   Diagnosis Date Noted  . Type II diabetes mellitus with complication ([place]) 53/64/6803  . Anatomical narrow angle glaucoma with borderline intraocular pressure 07/15/2017  . Vitamin D deficiency 03/11/2017  . Hypercalcemia 10/02/2016  . Pernicious anemia 04/11/2015  . Carpal tunnel syndrome 01/14/2015  . Hyperlipidemia associated with type 2 diabetes mellitus ([place]) 01/14/2015  . Essential hypertension 01/14/2015  . Climacteric 01/14/2015  . Muscle fatigue 01/14/2015  . Arthritis of hand, degenerative 01/14/2015  . GERD (  gastroesophageal reflux disease) 10/29/2011  . Spondylolisthesis 10/29/2011    Allergies  Allergen Reactions  . Atorvastatin     chest tightness  . Tramadol Hcl Nausea And Vomiting    Past Surgical  History:  Procedure Laterality Date  . ABDOMINAL HYSTERECTOMY  1981  . APPENDECTOMY  1981  . REPAIR DURAL / CSF LEAK  2002    Social History   Tobacco Use  . Smoking status: Never Smoker  . Smokeless tobacco: Never Used  . Tobacco comment: smoking cessation material not required  Substance Use Topics  . Alcohol use: No    Alcohol/week: 0.0 standard drinks  . Drug use: No     Medication list has been reviewed and updated.  Current Meds  Medication Sig  . Accu-Chek Softclix Lancets lancets Use as instructed  . amLODipine (NORVASC) 10 MG tablet TAKE 1 TABLET EVERY DAY  . aspirin 81 MG tablet Take 1 tablet by mouth daily.  . Blood Glucose Monitoring Suppl (ACCU-CHEK AVIVA PLUS) w/Device KIT USE TWICE DAILY  . Cholecalciferol 2000 units CAPS Take 1 capsule by mouth daily.  . diclofenac Sodium (VOLTAREN) 1 % GEL APPLY 2 GRAMS TOPICALLY 4 (FOUR) TIMES DAILY.  Marland Kitchen ezetimibe (ZETIA) 10 MG tablet TAKE 1 TABLET EVERY DAY  . Ferrous Gluconate 325 (36 FE) MG TABS Take by mouth.  . fluticasone (FLONASE) 50 MCG/ACT nasal spray USE 2 SPRAYS INTO BOTH NOSTRILS DAILY.  Marland Kitchen glimepiride (AMARYL) 2 MG tablet TAKE 1 TABLET EVERY DAY WITH BREAKFAST  . glucose blood (ACCU-CHEK AVIVA) test strip Use as instructed  . JANUMET 50-1000 MG tablet TAKE 1 TABLET TWICE DAILY WITH MEALS  . JARDIANCE 25 MG TABS tablet TAKE 1 TABLET EVERY DAY  . Lancet Devices (ACCU-CHEK SOFTCLIX) lancets 1 each by Other route 2 (two) times daily.  . ramipril (ALTACE) 5 MG capsule TAKE 1 CAPSULE EVERY DAY  . rosuvastatin (CRESTOR) 5 MG tablet TAKE 1 TABLET EVERY DAY  . triamcinolone cream (KENALOG) 0.1 % Apply topically 2 (two) times daily.  Marland Kitchen triamterene-hydrochlorothiazide (MAXZIDE-25) 37.5-25 MG tablet TAKE 1 TABLET EVERY DAY   Current Facility-Administered Medications for the 11/13/19 encounter (Office Visit) with Glean Hess, MD  Medication  . cyanocobalamin ((VITAMIN B-12)) injection 1,000 mcg    PHQ 2/9 Scores  11/13/2019 06/01/2019 05/01/2019 03/16/2019  PHQ - 2 Score 0 3 0 2  PHQ- 9 Score 0 _0 BP Readings from Last 3 Encounters:  11/13/19 140/78  05/01/19 134/74  03/16/19 138/80    Physical Exam Vitals and nursing note reviewed.  Constitutional:      General: She is not in acute distress.    Appearance: Normal appearance. She is well-developed.  HENT:     Head: Normocephalic and atraumatic.  Cardiovascular:     Rate and Rhythm: Normal rate and regular rhythm.     Pulses: Normal pulses.     Heart sounds: No murmur.  Pulmonary:     Effort: Pulmonary effort is normal. No respiratory distress.     Breath sounds: No wheezing or rhonchi.  Musculoskeletal:        General: Normal range of motion.     Cervical back: Normal range of motion.     Right lower leg: No edema.     Left lower leg: No edema.  Lymphadenopathy:     Cervical: No cervical adenopathy.  Skin:    General: Skin is warm and dry.     Findings: No rash.  Neurological:  Mental Status: She is alert and oriented to person, place, and time.     Cranial Nerves: Cranial nerves are intact.     Sensory: Sensation is intact.     Motor: Motor function is intact. No tremor or abnormal muscle tone.     Coordination: Coordination is intact. Finger-Nose-Finger Test normal.     Gait: Gait is intact.  Psychiatric:        Behavior: Behavior normal.        Thought Content: Thought content normal.     Wt Readings from Last 3 Encounters:  11/13/19 164 lb (74.4 kg)  06/01/19 158 lb 6.4 oz (71.8 kg)  05/01/19 165 lb (74.8 kg)    BP 140/78   Pulse (!) 106   Temp 98.4 F (36.9 C) (Oral)   Ht _0  (1.626 m)   Wt 164 lb (74.4 kg)   SpO2 97%   BMI 28.15 kg/m   Assessment and Plan: 1. Type II diabetes mellitus with complication (HCC) Clinically stable by exam and report without s/s of hypoglycemia. DM complicated by HTN. Foot exam normal Eye exam scheduled Tolerating medications Jardiance, Janumet and amaryl well  without side effects or other concerns. - Comprehensive metabolic panel - Hemoglobin A1c - POCT urinalysis dipstick  2. Essential hypertension Clinically stable exam with well controlled BP on Maxzide, altace, amlodipine. Tolerating medications without side effects at this time. Pt to continue current regimen and low sodium diet; benefits of regular exercise as able discussed.  3. Hyperlipidemia associated with type 2 diabetes mellitus ([place]) Tolerating statin medication without side effects at this time LDL is at goal of < 70 on current dose of high dose statin Continue same therapy without change at this time. - Lipid panel  4. Spondylolisthesis of lumbar region Worsening low back pain; seen in the past by Orthopedics Will add Relafen until she can be seen - Ambulatory referral to Orthopedic Surgery - nabumetone (RELAFEN) 750 MG tablet; Take 1 tablet (750 mg total) by mouth at bedtime as needed.  Dispense: 30 tablet; Refill: 1  5. Bilateral carpal tunnel syndrome Hx of same but not severe at this time May need to see Dr. Astrid Divine again  6. Rash and nonspecific skin eruption Intermittent rash with pruritis  - triamcinolone cream (KENALOG) 0.1 %; Apply topically 2 (two) times daily.  Dispense: 80 g; Refill: 1   Partially dictated using Editor, commissioning. Any errors are unintentional.  Halina Maidens, MD [facility]  11/13/2019

## 2019-11-13 NOTE — Patient Instructions (Signed)
Dr. Rob Hickman at [facility] for Carpal tunnel symptoms

## 2019-11-14 LAB — COMPREHENSIVE METABOLIC PANEL
ALT: 19 IU/L (ref 0–32)
AST: 14 IU/L (ref 0–40)
Albumin/Globulin Ratio: 2.2 (ref 1.2–2.2)
Albumin: 4.4 g/dL (ref 3.7–4.7)
Alkaline Phosphatase: 78 IU/L (ref 39–117)
BUN/Creatinine Ratio: 17 (ref 12–28)
BUN: 20 mg/dL (ref 8–27)
Bilirubin Total: 0.4 mg/dL (ref 0.0–1.2)
CO2: 22 mmol/L (ref 20–29)
Calcium: 10.6 mg/dL — ABNORMAL HIGH (ref 8.7–10.3)
Chloride: 99 mmol/L (ref 96–106)
Creatinine, Ser: 1.16 mg/dL — ABNORMAL HIGH (ref 0.57–1.00)
GFR calc Af Amer: 54 mL/min/{1.73_m2} — ABNORMAL LOW (ref >59)
GFR calc non Af Amer: 47 mL/min/{1.73_m2} — ABNORMAL LOW (ref >59)
Globulin, Total: 2 g/dL (ref 1.5–4.5)
Glucose: 244 mg/dL — ABNORMAL HIGH (ref 65–99)
Potassium: 4.1 mmol/L (ref 3.5–5.2)
Sodium: 139 mmol/L (ref 134–144)
Total Protein: 6.4 g/dL (ref 6.0–8.5)

## 2019-11-14 LAB — LIPID PANEL
Chol/HDL Ratio: 2.2 ratio (ref 0.0–4.4)
Cholesterol, Total: 116 mg/dL (ref 100–199)
HDL: 52 mg/dL (ref >39)
LDL Chol Calc (NIH): 37 mg/dL (ref 0–99)
Triglycerides: 167 mg/dL — ABNORMAL HIGH (ref 0–149)
VLDL Cholesterol Cal: 27 mg/dL (ref 5–40)

## 2019-11-14 LAB — HEMOGLOBIN A1C
Est. average glucose Bld gHb Est-mCnc: 192 mg/dL
Hgb A1c MFr Bld: 8.3 % — ABNORMAL HIGH (ref 4.8–5.6)

## 2019-11-15 ENCOUNTER — Ambulatory Visit: Payer: Self-pay

## 2019-11-15 NOTE — Telephone Encounter (Signed)
Returned call to patient  She has talked with pharmacy and told to call her physician about her new medication Nabumetone 750 mg. Patient states that she takes ASA 81 mg daily and Tylenol extra strength 2 tabs 2 times per day. Patient states she has not started medication.  She has no symptoms. Patient advised not to start medication until advised. Per protocol note will be routed to office for evaluation of information. Pease advise patient  Reason for Disposition . [1] Caller has NON-URGENT medication question about med that PCP prescribed AND [2] triager unable to answer question  Answer Assessment - Initial Assessment Questions 1.   NAME of MEDICATION: "What medicine are you calling about?"     nabumetone 2.   QUESTION: "What is your question?"    Patient taking asa and tylenol 2 times per day 3.   PRESCRIBING HCP: "Who prescribed it?" Reason: if prescribed by specialist, call should be referred to that group.    [place] 4. SYMPTOMS: "Do you have any symptoms?"     None patient has not started taking 5. SEVERITY: If symptoms are present, ask "Are they mild, moderate or severe?"    None 6.  PREGNANCY:  "Is there any chance that you are pregnant?" "When was your last menstrual period?"    N/A  Protocols used: MEDICATION QUESTION CALL-A-AH

## 2019-11-16 NOTE — Telephone Encounter (Signed)
Patient informed she can take aspirin 81, tylenol as needed, and Relafen as needed at bedtime per Dr. Army Melia.   CM

## 2019-11-19 ENCOUNTER — Encounter: Payer: Self-pay | Admitting: Internal Medicine

## 2019-11-22 ENCOUNTER — Other Ambulatory Visit: Payer: Self-pay | Admitting: Internal Medicine

## 2019-11-22 NOTE — Telephone Encounter (Signed)
Requested Prescriptions  Pending Prescriptions Disp Refills  . ACCU-CHEK AVIVA PLUS test strip [Pharmacy Med Name: ACCU-CHEK AVIVA PLUS   Strip] 300 strip 1    Sig: USE AS INSTRUCTED     Endocrinology: Diabetes - Testing Supplies Passed - 11/22/2019  2:17 PM      Passed - Valid encounter within last 12 months    Recent Outpatient Visits          1 week ago Type II diabetes mellitus with complication [facility])   [facility] Glean Hess, MD   5 months ago Acute non-recurrent maxillary sinusitis   [facility] Glean Hess, MD   6 months ago Type II diabetes mellitus with complication [facility])   [facility] Glean Hess, MD   8 months ago Chest pain, atypical   [facility] Glean Hess, MD   10 months ago Pernicious anemia   [facility] Glean Hess, MD      Future Appointments            In 3 months Army Melia Jesse Sans, MD [facility], [place]           . Accu-Chek Softclix Lancets lancets [Pharmacy Med Name: ACCU-CHEK SOFTCLIX LANCETS   ] 300 each 1    Sig: USE AS INSTRUCTED     Endocrinology: Diabetes - Testing Supplies Passed - 11/22/2019  2:17 PM      Passed - Valid encounter within last 12 months    Recent Outpatient Visits          1 week ago Type II diabetes mellitus with complication [facility])   [facility] Glean Hess, MD   5 months ago Acute non-recurrent maxillary sinusitis   [facility] Glean Hess, MD   6 months ago Type II diabetes mellitus with complication [facility])   [facility] Glean Hess, MD   8 months ago Chest pain, atypical   [facility] Glean Hess, MD   10 months ago Pernicious anemia   [facility] Glean Hess, MD      Future Appointments            In 3 months Army Melia Jesse Sans, MD [facility], [facility]

## 2019-12-04 DIAGNOSIS — M48062 Spinal stenosis, lumbar region with neurogenic claudication: Secondary | ICD-10-CM | POA: Diagnosis not present

## 2019-12-04 DIAGNOSIS — G5603 Carpal tunnel syndrome, bilateral upper limbs: Secondary | ICD-10-CM | POA: Diagnosis not present

## 2019-12-04 DIAGNOSIS — M4310 Spondylolisthesis, site unspecified: Secondary | ICD-10-CM | POA: Diagnosis not present

## 2019-12-05 ENCOUNTER — Telehealth: Payer: Self-pay | Admitting: Internal Medicine

## 2019-12-05 NOTE — Telephone Encounter (Unsigned)
Copied from [place] [id number]. Topic: General - Call Back - No Documentation >> Dec 05, 2019 11:08 AM Erick Blinks wrote: Pt is requesting a call back from Nurse, she says that she has been asked to start taking tramadol by her orthopedic dr. Noreta Solar to know if this is safe with current medications, she says she will not start taking this Rx until office contacts her with approval.  Best contact: [phone]

## 2019-12-05 NOTE — Telephone Encounter (Signed)
Patient informed she has an allergy in her chart to tramadol- nausea and vomiting. Told her she can take 1 tablet and if she has side effects to stop it and call orthopedics for a different medication.  She verbalized understanding.  CM

## 2019-12-10 ENCOUNTER — Other Ambulatory Visit: Payer: Self-pay

## 2019-12-10 ENCOUNTER — Ambulatory Visit (INDEPENDENT_AMBULATORY_CARE_PROVIDER_SITE_OTHER): Payer: Medicare PPO

## 2019-12-10 DIAGNOSIS — Z598 Other problems related to housing and economic circumstances: Secondary | ICD-10-CM

## 2019-12-10 DIAGNOSIS — Z599 Problem related to housing and economic circumstances, unspecified: Secondary | ICD-10-CM

## 2019-12-10 DIAGNOSIS — Z0000 Encounter for general adult medical examination without abnormal findings: Secondary | ICD-10-CM | POA: Diagnosis not present

## 2019-12-10 NOTE — Patient Instructions (Signed)
Sheila Sandoval , Thank you for taking time to come for your Medicare Wellness Visit. I appreciate your ongoing commitment to your health goals. Please review the following plan we discussed and let me know if I can assist you in the future.   Screening recommendations/referrals: Colonoscopy: done 09/09/15 Mammogram: done 08/03/19 Bone Density: done 05/27/17 Recommended yearly ophthalmology/optometry visit for glaucoma screening and checkup Recommended yearly dental visit for hygiene and checkup  Vaccinations: Influenza vaccine: done 05/01/19 Pneumococcal vaccine: done 03/27/19 Tdap vaccine: done 09/04/10 Shingles vaccine: done 10/13/17 & 01/05/18   Covid-19: done 08/30/19 & 09/28/19  Advanced directives: Advance directive discussed with you today. I have provided a copy for you to complete at home and have notarized. Once this is complete please bring a copy in to our office so we can scan it into your chart.  Conditions/risks identified: Recommend increasing physical activity   Next appointment: Please follow up in one year for your Medicare Annual Wellness visit.     Preventive Care 74 Years and Older, Female Preventive care refers to lifestyle choices and visits with your health care provider that can promote health and wellness. What does preventive care include?  A yearly physical exam. This is also called an annual well check.  Dental exams once or twice a year.  Routine eye exams. Ask your health care provider how often you should have your eyes checked.  Personal lifestyle choices, including:  Daily care of your teeth and gums.  Regular physical activity.  Eating a healthy diet.  Avoiding tobacco and drug use.  Limiting alcohol use.  Practicing safe sex.  Taking low-dose aspirin every day.  Taking vitamin and mineral supplements as recommended by your health care provider. What happens during an annual well check? The services and screenings done by your health care  provider during your annual well check will depend on your age, overall health, lifestyle risk factors, and family history of disease. Counseling  Your health care provider may ask you questions about your:  Alcohol use.  Tobacco use.  Drug use.  Emotional well-being.  Home and relationship well-being.  Sexual activity.  Eating habits.  History of falls.  Memory and ability to understand (cognition).  Work and work Statistician.  Reproductive health. Screening  You may have the following tests or measurements:  Height, weight, and BMI.  Blood pressure.  Lipid and cholesterol levels. These may be checked every 5 years, or more frequently if you are over 46 years old.  Skin check.  Lung cancer screening. You may have this screening every year starting at age 74 if you have a 30-pack-year history of smoking and currently smoke or have quit within the past 15 years.  Fecal occult blood test (FOBT) of the stool. You may have this test every year starting at age 74.  Flexible sigmoidoscopy or colonoscopy. You may have a sigmoidoscopy every 5 years or a colonoscopy every 10 years starting at age 74.  Hepatitis C blood test.  Hepatitis B blood test.  Sexually transmitted disease (STD) testing.  Diabetes screening. This is done by checking your blood sugar (glucose) after you have not eaten for a while (fasting). You may have this done every 1-3 years.  Bone density scan. This is done to screen for osteoporosis. You may have this done starting at age 74.  Mammogram. This may be done every 1-2 years. Talk to your health care provider about how often you should have regular mammograms. Talk with your health care  provider about your test results, treatment options, and if necessary, the need for more tests. Vaccines  Your health care provider may recommend certain vaccines, such as:  Influenza vaccine. This is recommended every year.  Tetanus, diphtheria, and acellular  pertussis (Tdap, Td) vaccine. You may need a Td booster every 10 years.  Zoster vaccine. You may need this after age 74.  Pneumococcal 13-valent conjugate (PCV13) vaccine. One dose is recommended after age 74.  Pneumococcal polysaccharide (PPSV23) vaccine. One dose is recommended after age 74. Talk to your health care provider about which screenings and vaccines you need and how often you need them. This information is not intended to replace advice given to you by your health care provider. Make sure you discuss any questions you have with your health care provider. Document Released: 08/08/2015 Document Revised: 03/31/2016 Document Reviewed: 05/13/2015 Elsevier Interactive Patient Education  2017 [employer] Prevention in the Home Falls can cause injuries. They can happen to people of all ages. There are many things you can do to make your home safe and to help prevent falls. What can I do on the outside of my home?  Regularly fix the edges of walkways and driveways and fix any cracks.  Remove anything that might make you trip as you walk through a door, such as a raised step or threshold.  Trim any bushes or trees on the path to your home.  Use bright outdoor lighting.  Clear any walking paths of anything that might make someone trip, such as rocks or tools.  Regularly check to see if handrails are loose or broken. Make sure that both sides of any steps have handrails.  Any raised decks and porches should have guardrails on the edges.  Have any leaves, snow, or ice cleared regularly.  Use sand or salt on walking paths during winter.  Clean up any spills in your garage right away. This includes oil or grease spills. What can I do in the bathroom?  Use night lights.  Install grab bars by the toilet and in the tub and shower. Do not use towel bars as grab bars.  Use non-skid mats or decals in the tub or shower.  If you need to sit down in the shower, use a plastic,  non-slip stool.  Keep the floor dry. Clean up any water that spills on the floor as soon as it happens.  Remove soap buildup in the tub or shower regularly.  Attach bath mats securely with double-sided non-slip rug tape.  Do not have throw rugs and other things on the floor that can make you trip. What can I do in the bedroom?  Use night lights.  Make sure that you have a light by your bed that is easy to reach.  Do not use any sheets or blankets that are too big for your bed. They should not hang down onto the floor.  Have a firm chair that has side arms. You can use this for support while you get dressed.  Do not have throw rugs and other things on the floor that can make you trip. What can I do in the kitchen?  Clean up any spills right away.  Avoid walking on wet floors.  Keep items that you use a lot in easy-to-reach places.  If you need to reach something above you, use a strong step stool that has a grab bar.  Keep electrical cords out of the way.  Do not use floor polish  or wax that makes floors slippery. If you must use wax, use non-skid floor wax.  Do not have throw rugs and other things on the floor that can make you trip. What can I do with my stairs?  Do not leave any items on the stairs.  Make sure that there are handrails on both sides of the stairs and use them. Fix handrails that are broken or loose. Make sure that handrails are as long as the stairways.  Check any carpeting to make sure that it is firmly attached to the stairs. Fix any carpet that is loose or worn.  Avoid having throw rugs at the top or bottom of the stairs. If you do have throw rugs, attach them to the floor with carpet tape.  Make sure that you have a light switch at the top of the stairs and the bottom of the stairs. If you do not have them, ask someone to add them for you. What else can I do to help prevent falls?  Wear shoes that:  Do not have high heels.  Have rubber  bottoms.  Are comfortable and fit you well.  Are closed at the toe. Do not wear sandals.  If you use a stepladder:  Make sure that it is fully opened. Do not climb a closed stepladder.  Make sure that both sides of the stepladder are locked into place.  Ask someone to hold it for you, if possible.  Clearly mark and make sure that you can see:  Any grab bars or handrails.  First and last steps.  Where the edge of each step is.  Use tools that help you move around (mobility aids) if they are needed. These include:  Canes.  Walkers.  Scooters.  Crutches.  Turn on the lights when you go into a dark area. Replace any light bulbs as soon as they burn out.  Set up your furniture so you have a clear path. Avoid moving your furniture around.  If any of your floors are uneven, fix them.  If there are any pets around you, be aware of where they are.  Review your medicines with your doctor. Some medicines can make you feel dizzy. This can increase your chance of falling. Ask your doctor what other things that you can do to help prevent falls. This information is not intended to replace advice given to you by your health care provider. Make sure you discuss any questions you have with your health care provider. Document Released: 05/08/2009 Document Revised: 12/18/2015 Document Reviewed: 08/16/2014 Elsevier Interactive Patient Education  2017 [employer].

## 2019-12-10 NOTE — Progress Notes (Signed)
Subjective:   Sheila Sandoval is a 74 y.o. female who presents for Medicare Annual (Subsequent) preventive examination.  Virtual Visit via Telephone Note  I connected with  Sheila Sandoval on 12/10/19 at 11:20 AM EDT by telephone and verified that I am speaking with the correct person using two identifiers.  Medicare Annual Wellness visit completed telephonically due to Covid-19 pandemic.   Location: Patient: home Provider: office   I discussed the limitations, risks, security and privacy concerns of performing an evaluation and management service by telephone and the availability of in person appointments. The patient expressed understanding and agreed to proceed.  Unable to perform video visit due to patient does not have video capability.   Some vital signs may be absent or patient reported.   Sheila Marker, LPN    Review of Systems:   Cardiac Risk Factors include: advanced age (>38mn, >>53women);diabetes mellitus;dyslipidemia;hypertension;obesity (BMI >30kg/m2);family history of premature cardiovascular disease;sedentary lifestyle     Objective:     Vitals: There were no vitals taken for this visit.  There is no height or weight on file to calculate BMI.  Advanced Directives 12/10/2019 03/12/2019 12/06/2018 01/02/2018 11/30/2017 11/05/2016 11/05/2016  Does Patient Have a Medical Advance Directive? No No No No No No No  Would patient like information on creating a medical advance directive? Yes (MAU/Ambulatory/Procedural Areas - Information given) No - Patient declined Yes (MAU/Ambulatory/Procedural Areas - Information given) Yes (MAU/Ambulatory/Procedural Areas - Information given) Yes (MAU/Ambulatory/Procedural Areas - Information given) - -    Tobacco Social History   Tobacco Use  Smoking Status Never Smoker  Smokeless Tobacco Never Used  Tobacco Comment   smoking cessation material not required     Counseling given: Not Answered Comment: smoking cessation  material not required   Clinical Intake:  Pre-visit preparation completed: Yes  Pain : 0-10 Pain Score: 5  Pain Type: Chronic pain Pain Location: Back Pain Descriptors / Indicators: Aching, Discomfort, Sore Pain Onset: More than a month ago Pain Frequency: Constant     Nutritional Risks: None Diabetes: Yes CBG done?: No Did pt. bring in CBG monitor from home?: No   Nutrition Risk Assessment:  Has the patient had any N/V/D within the last 2 months?  No  Does the patient have any non-healing wounds?  No  Has the patient had any unintentional weight loss or weight gain?  No   Diabetes:  Is the patient diabetic?  Yes  If diabetic, was a CBG obtained today?  No  Did the patient bring in their glucometer from home?  No  How often do you monitor your CBG's? Pt does not check blood sugar regularly.   Financial Strains and Diabetes Management:  Are you having any financial strains with the device, your supplies or your medication? No.  Does the patient want to be seen by Chronic Care Management for management of their diabetes?  Yes  - outreach scheduled for tomorrow Would the patient like to be referred to a Nutritionist or for Diabetic Management?  No   Diabetic Exams:  Diabetic Eye Exam: Completed 06/15/19 negative retinopathy.   Diabetic Foot Exam: Completed 11/13/19.   How often do you need to have someone help you when you read instructions, pamphlets, or other written materials from your doctor or pharmacy?: 1 - Never  Interpreter Needed?: No  Information entered by :: Sheila Sandoval  Past Medical History:  Diagnosis Date  . Arthritis   . B12 deficiency   . Diabetes  mellitus without complication ([place])   . GERD (gastroesophageal reflux disease)   . Hypercalcemia   . Hyperlipidemia   . Hypertension   . Vitamin D deficiency    Past Surgical History:  Procedure Laterality Date  . ABDOMINAL HYSTERECTOMY  1981  . APPENDECTOMY  1981  . REPAIR DURAL / CSF  LEAK  2002   Family History  Problem Relation Age of Onset  . Diabetes Mother   . Cancer Father        prostate  . Breast cancer Sister   . Cancer Brother        prostate  . Other Sister        fall   Social History   Socioeconomic History  . Marital status: Widowed    Spouse name: Not on file  . Number of children: 3  . Years of education: Not on file  . Highest education level: 11th grade  Occupational History  . Occupation: Retired  Tobacco Use  . Smoking status: Never Smoker  . Smokeless tobacco: Never Used  . Tobacco comment: smoking cessation material not required  Substance and Sexual Activity  . Alcohol use: No    Alcohol/week: 0.0 standard drinks  . Drug use: No  . Sexual activity: Not Currently  Other Topics Concern  . Not on file  Social History Narrative   Pt lives alone.    Social Determinants of Health   Financial Resource Strain: High Risk  . Difficulty of Paying Living Expenses: Hard  Food Insecurity: Food Insecurity Present  . Worried About Charity fundraiser in the Last Year: Sometimes true  . Ran Out of Food in the Last Year: Never true  Transportation Needs: No Transportation Needs  . Lack of Transportation (Medical): No  . Lack of Transportation (Non-Medical): No  Physical Activity: Inactive  . Days of Exercise per Week: 0 days  . Minutes of Exercise per Session: 0 min  Stress: No Stress Concern Present  . Feeling of Stress : Not at all  Social Connections: Slightly Isolated  . Frequency of Communication with Friends and Family: More than three times a week  . Frequency of Social Gatherings with Friends and Family: More than three times a week  . Attends Religious Services: More than 4 times per year  . Active Member of Clubs or Organizations: Yes  . Attends Archivist Meetings: More than 4 times per year  . Marital Status: Widowed    Outpatient Encounter Medications as of 12/10/2019  Medication Sig  . ACCU-CHEK AVIVA PLUS  test strip USE AS INSTRUCTED  . Accu-Chek Softclix Lancets lancets USE AS INSTRUCTED  . amLODipine (NORVASC) 10 MG tablet TAKE 1 TABLET EVERY DAY  . ammonium lactate (AMLACTIN) 12 % cream APPLY TOPICALLY AS NEEDED FOR DRY SKIN.  Marland Kitchen aspirin 81 MG tablet Take 1 tablet by mouth daily.  . Cholecalciferol 2000 units CAPS Take 1 capsule by mouth daily.  Marland Kitchen ezetimibe (ZETIA) 10 MG tablet TAKE 1 TABLET EVERY DAY  . Ferrous Gluconate 325 (36 FE) MG TABS Take by mouth.  . fluticasone (FLONASE) 50 MCG/ACT nasal spray USE 2 SPRAYS INTO BOTH NOSTRILS DAILY.  Marland Kitchen glimepiride (AMARYL) 2 MG tablet TAKE 1 TABLET EVERY DAY WITH BREAKFAST  . JANUMET 50-1000 MG tablet TAKE 1 TABLET TWICE DAILY WITH MEALS  . JARDIANCE 25 MG TABS tablet TAKE 1 TABLET EVERY DAY  . Lancet Devices (ACCU-CHEK SOFTCLIX) lancets 1 each by Other route 2 (two) times daily.  Marland Kitchen  ramipril (ALTACE) 5 MG capsule TAKE 1 CAPSULE EVERY DAY  . rosuvastatin (CRESTOR) 5 MG tablet TAKE 1 TABLET EVERY DAY  . traMADol (ULTRAM) 50 MG tablet 50 mg every 6 (six) hours as needed.   . triamcinolone cream (KENALOG) 0.1 % Apply topically 2 (two) times daily.  Marland Kitchen triamterene-hydrochlorothiazide (MAXZIDE-25) 37.5-25 MG tablet TAKE 1 TABLET EVERY DAY  . Blood Glucose Monitoring Suppl (ACCU-CHEK AVIVA PLUS) w/Device KIT USE TWICE DAILY  . diclofenac Sodium (VOLTAREN) 1 % GEL APPLY 2 GRAMS TOPICALLY 4 (FOUR) TIMES DAILY. (Patient not taking: Reported on 12/10/2019)  . nabumetone (RELAFEN) 750 MG tablet Take 1 tablet (750 mg total) by mouth at bedtime as needed. (Patient not taking: Reported on 12/10/2019)  . [DISCONTINUED] Aspirin Buf,CaCarb-MgCarb-MgO, 81 MG TABS daily  . [DISCONTINUED] estradiol (ESTRACE) 0.5 MG tablet TAKE 1 TABLET EVERY OTHER DAY (Patient not taking: Reported on 11/13/2019)   Facility-Administered Encounter Medications as of 12/10/2019  Medication  . cyanocobalamin ((VITAMIN B-12)) injection 1,000 mcg    Activities of Daily Living In your present  state of health, do you have any difficulty performing the following activities: 12/10/2019 03/12/2019  Hearing? N Y  Comment declines hearing aids -  Vision? N Y  Comment - sometimes blurry  Difficulty concentrating or making decisions? N N  Walking or climbing stairs? N Y  Comment - pain in knees at times  Dressing or bathing? N N  Doing errands, shopping? N N  Preparing Food and eating ? N N  Using the Toilet? N N  In the past six months, have you accidently leaked urine? N N  Do you have problems with loss of bowel control? N N  Managing your Medications? N N  Managing your Finances? N N  Housekeeping or managing your Housekeeping? N N  Some recent data might be hidden    Patient Care Team: Glean Hess, MD as PCP - General (Internal Medicine) Sherie Don, MD as Referring Physician (Ophthalmology) Dr Johnathan Hausen (Dermatology) Florance, Tomasa Blase, RN as [facility] Management Clenton Pare, MD as Referring Physician (Orthopedic Surgery)    Assessment:   This is a routine wellness examination for [place].  Exercise Activities and Dietary recommendations Current Exercise Habits: The patient does not participate in regular exercise at present, Exercise limited by: orthopedic condition(s)  Goals    . DIET - INCREASE WATER INTAKE     Recommend to drink at least 6-8 8oz glasses of water per day.    . Patient Stated     Patient states she would like to maintain healthy weight and lower A1c    . Prevent falls     Recommend to remove any items from the home that may cause slips or trips. Also recommend to add a non-slid pad to her shower to reduce risk for fall.        Fall Risk Fall Risk  12/10/2019 11/13/2019 05/11/2019 03/12/2019 01/19/2019  Falls in the past year? 0 0 0 0 0  Comment - - - - -  Number falls in past yr: 0 0 0 0 0  Injury with Fall? 0 0 0 0 0  Comment - - - - -  Risk Factor Category  - - - Low Risk (0 Points) -   Comment - - - - -  Risk for fall due to : No Fall Risks No Fall Risks - Impaired balance/gait;Impaired mobility;History of fall(s) -  Risk for fall due to: Comment - - - - -  Follow up Falls prevention discussed Falls evaluation completed Falls evaluation completed;Education provided Falls evaluation completed;Education provided Falls evaluation completed   FALL RISK PREVENTION PERTAINING TO THE HOME:  Any stairs in or around the home? No  If so, do they handrails? No   Home free of loose throw rugs in walkways, pet beds, electrical cords, etc? Yes  Adequate lighting in your home to reduce risk of falls? Yes   ASSISTIVE DEVICES UTILIZED TO PREVENT FALLS:  Life alert? No  Use of a cane, walker or w/c? No  Grab bars in the bathroom? Yes  Shower chair or bench in shower? Yes  Elevated toilet seat or a handicapped toilet? Yes   DME ORDERS:  DME order needed?  No   TIMED UP AND GO:  Was the test performed? No . Telephonic visit.   Education: Fall risk prevention has been discussed.  Intervention(s) required? No   Depression Screen PHQ 2/9 Scores 12/10/2019 11/13/2019 06/01/2019 05/01/2019  PHQ - 2 Score 0 0 3 0  PHQ- 9 Score - 0 5 4     Cognitive Function     6CIT Screen 12/10/2019 12/06/2018 11/30/2017 11/05/2016  What Year? 0 points 0 points 0 points 0 points  What month? 0 points 0 points 0 points 0 points  What time? 0 points 0 points 0 points 0 points  Count back from 20 0 points 0 points 0 points 0 points  Months in reverse 0 points 0 points 4 points 0 points  Repeat phrase 2 points 2 points 4 points 0 points  Total Score 2 2 8  0    Immunization History  Administered Date(s) Administered  . Fluad Quad(high Dose 65+) 05/01/2019  . Influenza, High Dose Seasonal PF 04/07/2018  . Influenza,inj,Quad PF,6+ Mos 07/25/2013, 04/11/2015, 06/04/2016, 08/09/2016, 05/04/2017  . Influenza,inj,quad, With Preservative 03/27/2019  . Influenza-Unspecified 08/20/2014  . Moderna  SARS-COVID-2 Vaccination 08/30/2019, 09/28/2019  . Pneumococcal Conjugate-13 08/02/2014  . Pneumococcal Polysaccharide-23 09/04/2010, 09/17/2016  . Pneumococcal-Unspecified 07/26/2000, 09/04/2010, 03/27/2019  . Tdap 09/04/2010  . Unspecified SARS-COV-2 Vaccination 09/19/2019, 09/28/2019  . Zoster Recombinat (Shingrix) 10/13/2017, 01/05/2018    Qualifies for Shingles Vaccine? Shingrix series completed.   Tdap: Up to date  Flu Vaccine: Up to date  Pneumococcal Vaccine: Up to date  Covid-19 Vaccine: Up to date   Screening Tests Health Maintenance  Topic Date Due  . INFLUENZA VACCINE  02/24/2020  . HEMOGLOBIN A1C  05/14/2020  . OPHTHALMOLOGY EXAM  06/14/2020  . MAMMOGRAM  08/02/2020  . TETANUS/TDAP  09/04/2020  . FOOT EXAM  11/12/2020  . COLONOSCOPY  09/08/2025  . DEXA SCAN  Completed  . COVID-19 Vaccine  Completed  . Hepatitis C Screening  Completed  . PNA vac Low Risk Adult  Completed    Cancer Screenings:  Colorectal Screening: Completed 09/09/15. Repeat every 10 years;   Mammogram: Completed 08/03/19. Repeat every year;   Bone Density: Completed 05/27/17. Results reflect NORMAL. Repeat every 2 years.   Lung Cancer Screening: (Low Dose CT Chest recommended if Age 62-80 years, 30 pack-year currently smoking OR have quit w/in 15years.) does not qualify.   Additional Screening:  Hepatitis C Screening: does qualify; Completed 11/05/16  Vision Screening: Recommended annual ophthalmology exams for early detection of glaucoma and other disorders of the eye. Is the patient up to date with their annual eye exam?  Yes  Who is the provider or what is the name of the office in which the pt attends annual eye exams? Dr. Brigitte Pulse  Dental Screening: Recommended annual dental exams for proper oral hygiene  Community Resource Referral:  CRR required this visit?  No      Plan:     I have personally reviewed and addressed the Medicare Annual Wellness questionnaire and have noted the  following in the patient's chart:  A. Medical and social history B. Use of alcohol, tobacco or illicit drugs  C. Current medications and supplements D. Functional ability and status E.  Nutritional status F.  Physical activity G. Advance directives H. List of other physicians I.  Hospitalizations, surgeries, and ER visits in previous 12 months J.  [place] such as hearing and vision if needed, cognitive and depression L. Referrals and appointments   In addition, I have reviewed and discussed with patient certain preventive protocols, quality metrics, and best practice recommendations. A written personalized care plan for preventive services as well as general preventive health recommendations were provided to patient.   Signed,  Sheila Marker, LPN Nurse Health Advisor   Nurse Notes: pt recently prescribed tramadol from ortho and not taking nabumetone at this time. Tramadol on allergy list but pt says she is tolerating it at this time.

## 2019-12-10 NOTE — Patient Outreach (Signed)
[facility]) Care Management  12/10/2019  Sheila Sandoval December 30, 1945 [id number]   Telephone Assessment Annual Assessment   Outreach attempt to patient. Spoke with patient who denies any acute issues or concerns at present. She reports that she continues to have issues with pain in her pain due to chronic back issues. She has been taking meds as prescribed and reports relief with using meds. Patient voices she is taking Tramadol for pain as ordered. However, med noted on allergy list. Paitnr stte that in the apst med caused GI upset. However, since starting med this time she is eating prior to taking med and has had no GI upset and feels like she is not truly allergic to med. Patient reports MD is aware that med listed on allergy list and that she is tolerating med this time around. Patient continues to be independent with ADLs/IADLs. Appetite remains "too good" per pt report. She admits that hs had stopped checking cbgs for the past several months. She voices she spoke with nurse at MD office earlier today and plans to restart checking cbgs at least 2x/day as ordered. Last A1C on file was 8.3(April 20201). Patient able to verbalize her A1C goal is 7 or less. She voices she would like the [employer] but was told that her insurance would not cover it. She denies any RN CM needs or concerns at this time.    Medications Reviewed Today    Reviewed by Hayden Pedro, RN (Registered Nurse) on 12/10/19 at 1408  Med List Status: <None>  Medication Order Taking? Sig Documenting Provider Last Dose Status Informant  ACCU-CHEK AVIVA PLUS test strip [id number] No USE AS INSTRUCTED Glean Hess, MD Taking Active   Accu-Chek Softclix Lancets lancets [id number] No USE AS INSTRUCTED Glean Hess, MD Taking Active   amLODipine (NORVASC) 10 MG tablet [id number] No TAKE 1 TABLET EVERY DAY Glean Hess, MD Taking Active   ammonium lactate (AMLACTIN) 12 % cream  [id number] No APPLY TOPICALLY AS NEEDED FOR DRY SKIN. Glean Hess, MD Taking Active   aspirin 81 MG tablet [id number] No Take 1 tablet by mouth daily. [provider] Taking Active            Med Note Romana Juniper Dec 10, 2019 11:31 AM)    Blood Glucose Monitoring Suppl (ACCU-CHEK AVIVA PLUS) w/Device KIT [id number] No USE TWICE DAILY Glean Hess, MD Taking Active   Cholecalciferol 2000 units CAPS [id number] No Take 1 capsule by mouth daily. [provider] Taking Active   cyanocobalamin ((VITAMIN B-12)) injection 1,000 mcg [id number]   Juline Patch, MD  Active   diclofenac Sodium (VOLTAREN) 1 % GEL [id number] No APPLY 2 GRAMS TOPICALLY 4 (FOUR) TIMES DAILY.  Patient not taking: Reported on 12/10/2019   Glean Hess, MD Not Taking Active   ezetimibe (ZETIA) 10 MG tablet [id number] No TAKE 1 TABLET EVERY DAY Glean Hess, MD Taking Active   Ferrous Gluconate 325 (36 FE) MG TABS [id number] No Take by mouth. [provider] Taking Active            Med Note Winfield Cunas, ELISABETH A   Wed Sep 07, 2017 11:13 AM) Taking 1 tablet by mouth once daily  fluticasone (FLONASE) 50 MCG/ACT nasal spray [id number] No USE 2 SPRAYS INTO BOTH NOSTRILS DAILY. Glean Hess, MD Taking Active   glimepiride (AMARYL) 2 MG tablet [id number] No TAKE 1 TABLET EVERY DAY WITH BREAKFAST  Glean Hess, MD Taking Active   JANUMET 50-1000 MG tablet [id number] No TAKE 1 TABLET TWICE DAILY WITH MEALS Glean Hess, MD Taking Active   JARDIANCE 25 MG TABS tablet [id number] No TAKE 1 TABLET EVERY DAY Glean Hess, MD Taking Active   Lancet Devices [facility]) lancets [id number] No 1 each by Other route 2 (two) times daily. Glean Hess, MD Taking Active   nabumetone (RELAFEN) 750 MG tablet [id number] No Take 1 tablet (750 mg total) by mouth at bedtime as needed.  Patient not taking: Reported on 12/10/2019   Glean Hess, MD Not Taking Active   ramipril  (ALTACE) 5 MG capsule [id number] No TAKE 1 CAPSULE EVERY DAY Glean Hess, MD Taking Active   rosuvastatin (CRESTOR) 5 MG tablet [id number] No TAKE 1 TABLET EVERY DAY Glean Hess, MD Taking Active   traMADol (ULTRAM) 50 MG tablet [id number] No 50 mg every 6 (six) hours as needed.  [provider] Taking Active   triamcinolone cream (KENALOG) 0.1 % [id number] No Apply topically 2 (two) times daily. Glean Hess, MD Taking Active   triamterene-hydrochlorothiazide [facility]) 37.5-25 MG tablet [id number] No TAKE 1 TABLET EVERY DAY Glean Hess, MD Taking Active             Patient Outreach Telephone from 12/10/2019 in [place]  PHQ-2 Total Score  0     Fall Risk  12/10/2019 12/10/2019 11/13/2019 05/11/2019 03/12/2019  Falls in the past year? 1 0 0 0 0  Comment - - - - -  Number falls in past yr: 0 0 0 0 0  Injury with Fall? 0 0 0 0 0  Comment - - - - -  Risk Factor Category  - - - - Low Risk (0 Points)  Comment - - - - -  Risk for fall due to : History of fall(s);Medication side effect No Fall Risks No Fall Risks - Impaired balance/gait;Impaired mobility;History of fall(s)  Risk for fall due to: Comment - - - - -  Follow up Falls evaluation completed;Education provided Falls prevention discussed Falls evaluation completed Falls evaluation completed;Education provided Falls evaluation completed;Education provided   SDOH Screenings   Alcohol Screen: Low Risk   . Last Alcohol Screening Score (AUDIT): 0  Depression (PHQ2-9): Low Risk   . PHQ-2 Score: 0  Financial Resource Strain: High Risk  . Difficulty of Paying Living Expenses: Hard  Food Insecurity: Food Insecurity Present  . Worried About Charity fundraiser in the Last Year: Sometimes true  . Ran Out of Food in the Last Year: Never true  Housing: Low Risk   . Last Housing Risk Score: 0  Physical Activity: Inactive  . Days of Exercise per Week: 0 days  . Minutes of Exercise per Session: 0 min   Social Connections: Slightly Isolated  . Frequency of Communication with Friends and Family: More than three times a week  . Frequency of Social Gatherings with Friends and Family: More than three times a week  . Attends Religious Services: More than 4 times per year  . Active Member of Clubs or Organizations: Yes  . Attends Archivist Meetings: More than 4 times per year  . Marital Status: Widowed  Stress: No Stress Concern Present  . Feeling of Stress : Not at all  Tobacco Use: Low Risk   . Smoking Tobacco Use: Never Smoker  . Smokeless Tobacco Use: Never Used  Transportation Needs: No Transportation Needs  .  Lack of Transportation (Medical): No  . Lack of Transportation (Non-Medical): No   [facility] CM Care Plan Problem One     Most Recent Value  Care Plan Problem One  Knowledge deficit related to disease process and mgmt of Diabetes.  Role Documenting the Problem One  Care Management Telephonic [place] for Problem One  Active  [facility] Long Term Goal   Patient will report a deccease in A1C level from 8.1 within the next 90 days.  [facility] Long Term Goal Start Date  05/11/19  Interventions for Problem One Long Term Goal  RNCM reviewed and discussed most recent A1C level of 8.3. RN CM educated pt on diabeitc diet and reinforcement. RN CM discussed with pt. importance of home cbg monitoring as ordered by MD. RN CM confirmed pt has cbg meter in the home and knows how to use it.    [facility] CM Care Plan Problem Two     Most Recent Value  Care Plan Problem Two  Patient with chronic back pain  Role Documenting the Problem Two  Care Management Telephonic Coordinator  Care Plan for Problem Two  Active  Interventions for Problem Two Long Term Goal   RNCM completed pain assessment. RN CM discussed and reviewed ways to amange pain. RN CM discussed SEs of pain meds. RN CM reinforced importance of seeking medical attention for worsening and/or unresolved pain issues.   [facility] Long Term Goal   Patient will report an acceptable pain level of 3 or less over the next  60 days.  [facility] Long Term Goal Start Date  12/10/19       Plan: RN CM discussed with patient next outreach within the month of July. Patient gave verbal consent and in agreement with RN CM follow up and timeframe. Patient aware that they may contact RN CM sooner for any issues or concerns. RN CM will send quarterly update to PCP.  [name], RN,BSN,CCM [place] Management Telephonic Care Management Coordinator Direct Phone: [phone] Toll Free: [phone] Fax: [phone]

## 2019-12-13 ENCOUNTER — Ambulatory Visit: Payer: Self-pay

## 2019-12-18 DIAGNOSIS — H18519 Endothelial corneal dystrophy, unspecified eye: Secondary | ICD-10-CM | POA: Diagnosis not present

## 2019-12-18 DIAGNOSIS — H18513 Endothelial corneal dystrophy, bilateral: Secondary | ICD-10-CM | POA: Diagnosis not present

## 2019-12-18 DIAGNOSIS — H04123 Dry eye syndrome of bilateral lacrimal glands: Secondary | ICD-10-CM | POA: Diagnosis not present

## 2019-12-18 DIAGNOSIS — H2513 Age-related nuclear cataract, bilateral: Secondary | ICD-10-CM | POA: Diagnosis not present

## 2019-12-31 ENCOUNTER — Telehealth: Payer: Self-pay

## 2019-12-31 NOTE — Telephone Encounter (Signed)
Copied from [place] [id number]. Topic: Referral - Status >> Dec 31, 2019 05:10 AM Simone Curia D wrote: 01/24/24 Unable to leave a message regarding referral for utility assistance voicemail not set-up, will call again 01/01/20. Ambrose Mantle [phone]

## 2020-01-01 ENCOUNTER — Telehealth: Payer: Self-pay

## 2020-01-01 DIAGNOSIS — M48062 Spinal stenosis, lumbar region with neurogenic claudication: Secondary | ICD-10-CM | POA: Diagnosis not present

## 2020-01-01 NOTE — Telephone Encounter (Signed)
Copied from [place] [phone]. Topic: Referral - Status >> Jan 01, 2020 14:78 PM Simone Curia D wrote: [birth year] Left message on voicemail for patient to return my call regarding financial assistance for utilities.  Ambrose Mantle [phone]

## 2020-01-02 ENCOUNTER — Telehealth: Payer: Self-pay

## 2020-01-02 NOTE — Telephone Encounter (Signed)
Copied from [place] [id number]. Topic: Referral - Status >> Jan 02, 2020 00:37 PM Simone Curia D wrote: 0/4/88 3rd attempt to contact patient.  Left message on voicemail for patient to return my call regarding financial assistance for utilities.  After several attempts I have been unable to reach this patient. Human resources officer with [employer] application to [employer] to mail out to patient. Closing referral.   Ambrose Mantle [phone]

## 2020-01-05 ENCOUNTER — Other Ambulatory Visit: Payer: Self-pay | Admitting: Internal Medicine

## 2020-01-05 DIAGNOSIS — R21 Rash and other nonspecific skin eruption: Secondary | ICD-10-CM

## 2020-01-05 NOTE — Telephone Encounter (Signed)
Requested Prescriptions  Pending Prescriptions Disp Refills  . triamcinolone cream (KENALOG) 0.1 % [Pharmacy Med Name: TRIAMCINOLONE ACETONIDE 0.1 % Cream] 80 g 1    Sig: APPLY TOPICALLY TWICE DAILY     Dermatology:  Corticosteroids Passed - 01/05/2020  4:06 PM      Passed - Valid encounter within last 12 months    Recent Outpatient Visits          1 month ago Type II diabetes mellitus with complication [facility])   [facility] Glean Hess, [facility]   7 months ago Acute non-recurrent maxillary sinusitis   [facility] Glean Hess, MD   8 months ago Type II diabetes mellitus with complication [facility])   [facility] Glean Hess, MD   9 months ago Chest pain, atypical   [facility] Glean Hess, MD   11 months ago Pernicious anemia   [facility] Glean Hess, MD      Future Appointments            In 2 months Army Melia Jesse Sans, MD [facility], [facility]

## 2020-01-08 DIAGNOSIS — M5136 Other intervertebral disc degeneration, lumbar region: Secondary | ICD-10-CM | POA: Diagnosis not present

## 2020-01-24 ENCOUNTER — Telehealth: Payer: Self-pay | Admitting: Internal Medicine

## 2020-01-24 NOTE — Telephone Encounter (Signed)
Copied from [place] [id number]. Topic: General - Other >> Jan 24, 2020  3:05 PM Wynetta Emery, [place] C wrote: Reason for CRM: pt called in to speak with CMA about getting a B-12 shot. Pt would like to discuss with her further before scheduling.

## 2020-01-25 ENCOUNTER — Ambulatory Visit (INDEPENDENT_AMBULATORY_CARE_PROVIDER_SITE_OTHER): Payer: Medicare PPO

## 2020-01-25 ENCOUNTER — Other Ambulatory Visit: Payer: Self-pay

## 2020-01-25 DIAGNOSIS — E538 Deficiency of other specified B group vitamins: Secondary | ICD-10-CM

## 2020-01-25 MED ORDER — CYANOCOBALAMIN 1000 MCG/ML IJ SOLN
1000.0000 ug | Freq: Once | INTRAMUSCULAR | Status: AC
  Administered 2020-01-25: 1000 ug via INTRAMUSCULAR

## 2020-01-25 NOTE — Telephone Encounter (Signed)
Scheduled b12 injection this afternoon.  CM

## 2020-02-07 ENCOUNTER — Telehealth: Payer: Self-pay | Admitting: Internal Medicine

## 2020-02-07 ENCOUNTER — Other Ambulatory Visit: Payer: Self-pay

## 2020-02-07 NOTE — Telephone Encounter (Signed)
Copied from [place] [id number]. Topic: General - Other >> Feb 07, 2020 12:40 PM Alanda Slim E wrote: Reason for CRM: Pt called to speak with Dr. Oneal Deputy nurse about medications / please advise

## 2020-02-07 NOTE — Patient Outreach (Signed)
[facility]) Care Management  02/07/2020  Sheila Sandoval [birth year] [id number]   Telephone Assessment    Outreach attempt to patient. Spoke with patient who reports she is doing and feeling better since previous call. She continues to have occasional back pain but it is controlled with med regimen. Appetite remains WNL for patient. She reports cbgs have been ranging in the mid 100's. Patient states she has intermittent periods of constipation related to pain med. RN CM discussed ways to treat and manage. Also, encouraged patient to discuss with PCP about what meds safe for her to take as well. Patient reports she would like tot alk with MD about stopping some of her meds as she feels like she is on too many meds. He will follow up with MD. She saw PCP last in April and sees about every 6 months. She denies any RN CM needs or concerns at this time.       Plan: RN CM discussed with patient next outreach within the month of October. Patient gave verbal consent and in agreement with RN CM follow up and timeframe. Patient aware that they may contact RN CM sooner for any issues or concerns.   [name], RN,BSN,CCM [place] [facility] Telephonic Care Management Coordinator Direct Phone: [phone] Toll Free: [phone] Fax: [phone]

## 2020-02-07 NOTE — Telephone Encounter (Signed)
Spoke to pt charted call on previous telephone encounter.  KP

## 2020-02-07 NOTE — Telephone Encounter (Signed)
Copied from [place] [id number]. Topic: General - Other >> Feb 07, 2020 12:40 PM Alanda Slim E wrote: Reason for CRM: Pt called to speak with Dr. Oneal Deputy nurse about medications / please advise >> Feb 07, 2020  2:11 PM Keene Breath wrote: Patient called to speak with the nurse 2x.  She is still waiting for a response. Please call patient back.

## 2020-02-07 NOTE — Telephone Encounter (Signed)
Called pt she told me that she has been constipated. She thinks its because she is taking a lot of medications. Advised pt to get miralax or colace 100mg  (X2 a day) OTC to help and if she is still having problems after trying something OTC to give [place] a call. Pt verbalized understanding.  KP

## 2020-02-13 ENCOUNTER — Ambulatory Visit: Payer: Self-pay

## 2020-02-13 NOTE — Telephone Encounter (Signed)
There is nothing stronger in the same class.  She should be limiting her use of nsaids due to renal damage.

## 2020-02-13 NOTE — Telephone Encounter (Signed)
Please Advise.  KP

## 2020-02-13 NOTE — Telephone Encounter (Signed)
Pt called in and state the nabumetone (RELAFEN) 750 MG tablet [[id number]] Is not strong enough or is not working for her.  She would like to know if there is something strong she can take or can be called in?  . She stated she is needing something during day . [facility]  [phone]

## 2020-02-14 NOTE — Telephone Encounter (Signed)
Called pt back left VM that there is nothing stronger in the same class that she should limit her use of nsaids due to kidney damage. Pts name was stated on VM.  KP

## 2020-02-20 ENCOUNTER — Other Ambulatory Visit: Payer: Self-pay | Admitting: Internal Medicine

## 2020-02-21 ENCOUNTER — Telehealth: Payer: Self-pay

## 2020-02-21 NOTE — Telephone Encounter (Signed)
Pt called saying se is having numbness in her thumb and thought it was from "two new medications that she is taking". The two new medications that she named was Tramadol and Relafen. Told pt to hold off on taking Relafen until we reach out to her tomorrow. Told pt that Dr. Army Melia did not prescribe Tramadol to her that she would need to reach out to who prescribe that for her. Pt verbalized understanding and said she would call the doctor that prescribe tramadol.  KP

## 2020-02-22 ENCOUNTER — Other Ambulatory Visit: Payer: Self-pay | Admitting: Internal Medicine

## 2020-02-22 NOTE — Telephone Encounter (Signed)
Requested medication (s) are due for refill today: n/a  Requested medication (s) are on the active medication list: No  Last refill:  n/a  Future visit scheduled: Yes  Notes to clinic:  Pt. States she has been off medication x 1 year and would like to go back on it. Please advise.    Requested Prescriptions  Pending Prescriptions Disp Refills   estradiol (ESTRACE) 0.5 MG tablet [Pharmacy Med Name: ESTRADIOL 0.5 MG Tablet] 45 tablet     Sig: TAKE 1 TABLET EVERY OTHER DAY      OB/GYN:  Estrogens Failed - 02/22/2020  2:04 PM      Failed - Last BP in normal range    BP Readings from Last 1 Encounters:  11/13/19 140/78          Passed - Mammogram is up-to-date per Health Maintenance      Passed - Valid encounter within last 12 months    Recent Outpatient Visits           3 months ago Type II diabetes mellitus with complication [facility])   [facility] Glean [facility], MD   8 months ago Acute non-recurrent maxillary sinusitis   [facility] Glean Hess, MD   9 months ago Type II diabetes mellitus with complication [facility])   [facility] Glean Hess, MD   11 months ago Chest pain, atypical   [facility] Glean Hess, MD   1 year ago Pernicious anemia   [facility] Glean Hess, MD       Future Appointments             In 3 weeks Army Melia Jesse Sans, MD [facility], [facility]

## 2020-02-22 NOTE — Telephone Encounter (Signed)
I feel confident saying that neither relafen nor tramadol will cause numbness in her thumb.

## 2020-03-19 ENCOUNTER — Ambulatory Visit: Payer: Medicare PPO | Admitting: Internal Medicine

## 2020-04-04 ENCOUNTER — Ambulatory Visit: Payer: Medicare PPO | Admitting: Internal Medicine

## 2020-04-14 ENCOUNTER — Telehealth: Payer: Self-pay | Admitting: Internal Medicine

## 2020-04-14 ENCOUNTER — Other Ambulatory Visit: Payer: Self-pay

## 2020-04-14 MED ORDER — BLOOD GLUCOSE METER KIT: PACK | 0 refills | Status: DC

## 2020-04-14 NOTE — Telephone Encounter (Signed)
Medication: ACCU-Check Aviva  Glucose Meter  Has the patient contacted their pharmacy? YES  (Agent: If no, request that the patient contact the pharmacy for the refill.) (Agent: If yes, when and what did the pharmacy advise?)  Preferred Pharmacy (with phone number or street name): The Pharmcy is requesting a call back at [place], [place]  Phone:  [phone] Fax:  [phone]     Agent: Please be advised that RX refills may take up to 3 business days. We ask that you follow-up with your pharmacy.

## 2020-04-14 NOTE — Telephone Encounter (Signed)
Called pt left VM that blood glucose kit was sent in to pharmacy. Pts name stated on VM.  KP

## 2020-04-16 ENCOUNTER — Other Ambulatory Visit: Payer: Self-pay | Admitting: Internal Medicine

## 2020-04-16 MED ORDER — BLOOD GLUCOSE METER KIT: PACK | 0 refills | Status: DC

## 2020-04-16 NOTE — Telephone Encounter (Signed)
Medication Refill - Medication: blood glucose meter kit and supplies Single use alcohol swabs     Preferred Pharmacy (with phone number or street name):  [place], [place] Phone:  [phone]  Fax:  [phone]       Agent: Please be advised that RX refills may take up to 3 business days. We ask that you follow-up with your pharmacy.

## 2020-04-16 NOTE — Telephone Encounter (Signed)
Switching pharmacy, Rx sent on 04/14/20 to the wrong pharmacy.

## 2020-04-21 ENCOUNTER — Other Ambulatory Visit: Payer: Self-pay

## 2020-04-21 ENCOUNTER — Ambulatory Visit (INDEPENDENT_AMBULATORY_CARE_PROVIDER_SITE_OTHER): Payer: Medicare PPO | Admitting: Internal Medicine

## 2020-04-21 ENCOUNTER — Encounter: Payer: Self-pay | Admitting: Internal Medicine

## 2020-04-21 VITALS — BP 106/64 | HR 70 | Temp 98.1°F | Ht 64.0 in | Wt 160.0 lb

## 2020-04-21 DIAGNOSIS — E785 Hyperlipidemia, unspecified: Secondary | ICD-10-CM

## 2020-04-21 DIAGNOSIS — I10 Essential (primary) hypertension: Secondary | ICD-10-CM

## 2020-04-21 DIAGNOSIS — R399 Unspecified symptoms and signs involving the genitourinary system: Secondary | ICD-10-CM | POA: Diagnosis not present

## 2020-04-21 DIAGNOSIS — E1169 Type 2 diabetes mellitus with other specified complication: Secondary | ICD-10-CM

## 2020-04-21 DIAGNOSIS — M4316 Spondylolisthesis, lumbar region: Secondary | ICD-10-CM | POA: Diagnosis not present

## 2020-04-21 DIAGNOSIS — G5603 Carpal tunnel syndrome, bilateral upper limbs: Secondary | ICD-10-CM | POA: Diagnosis not present

## 2020-04-21 DIAGNOSIS — E538 Deficiency of other specified B group vitamins: Secondary | ICD-10-CM

## 2020-04-21 DIAGNOSIS — N1831 Chronic kidney disease, stage 3a: Secondary | ICD-10-CM | POA: Diagnosis not present

## 2020-04-21 DIAGNOSIS — E118 Type 2 diabetes mellitus with unspecified complications: Secondary | ICD-10-CM

## 2020-04-21 DIAGNOSIS — Z23 Encounter for immunization: Secondary | ICD-10-CM | POA: Diagnosis not present

## 2020-04-21 LAB — POCT URINALYSIS DIPSTICK
Bilirubin, UA: NEGATIVE
Blood, UA: NEGATIVE
Glucose, UA: POSITIVE — AB
Ketones, UA: NEGATIVE
Leukocytes, UA: NEGATIVE
Nitrite, UA: NEGATIVE
Protein, UA: NEGATIVE
Spec Grav, UA: <=1.005 — AB (ref 1.010–1.025)
Urobilinogen, UA: 0.2 E.U./dL
pH, UA: 6.5 (ref 5.0–8.0)

## 2020-04-21 MED ORDER — NABUMETONE 750 MG PO TABS: 750.0000 mg | ORAL_TABLET | Freq: Every evening | ORAL | 3 refills | Status: DC | PRN

## 2020-04-21 NOTE — Progress Notes (Signed)
Date:  04/21/2020   Name:  Sheila Sandoval   DOB:  28-Sep-1945   MRN:  [id number]   Chief Complaint: Hypertension (follow up ), Diabetes, Hyperlipidemia, Flu Vaccine, and Urinary Tract Infection (X1 month, urine has a onion smell, lower back pain, stomach pain )  Diabetes She presents for her follow-up diabetic visit. She has type 2 diabetes mellitus. Her disease course has been worsening. Pertinent negatives for hypoglycemia include no headaches or tremors. Pertinent negatives for diabetes include no chest pain, no fatigue, no polydipsia and no polyuria. Symptoms are stable. Current diabetic treatments: jardiance, janumet, glimepiride. She is compliant with treatment most of the time. An ACE inhibitor/angiotensin II receptor blocker is being taken.  Hypertension This is a chronic problem. The problem is controlled. Pertinent negatives include no chest pain, headaches, palpitations or shortness of breath. Past treatments include ACE inhibitors and calcium channel blockers. The current treatment provides significant improvement. There are no compliance problems.   Hyperlipidemia This is a chronic problem. The problem is controlled. Pertinent negatives include no chest pain or shortness of breath. Current antihyperlipidemic treatment includes statins and ezetimibe. The current treatment provides significant improvement of lipids.  Back Pain This is a chronic problem. The problem has been waxing and waning since onset. The pain is present in the lumbar spine. The pain does not radiate. The pain is moderate. Pertinent negatives include no abdominal pain, chest pain, dysuria, fever, headaches or numbness. She has tried analgesics and NSAIDs (Relafen and gabapentin) for the symptoms. The treatment provided significant relief.  Stage 3 CKD - last GFR 54.  She takes Tylenol as needed and Relafen as needed (about 1-2 times per week)  Lab Results  Component Value Date   CREATININE 1.16 (H) 11/13/2019     BUN 20 11/13/2019   NA 139 11/13/2019   K 4.1 11/13/2019   CL 99 11/13/2019   CO2 22 11/13/2019   Lab Results  Component Value Date   CHOL 116 11/13/2019   HDL 52 11/13/2019   LDLCALC 37 11/13/2019   TRIG 167 (H) 11/13/2019   CHOLHDL 2.2 11/13/2019   Lab Results  Component Value Date   TSH 1.710 12/22/2018   Lab Results  Component Value Date   HGBA1C 8.3 (H) 11/13/2019   Lab Results  Component Value Date   WBC 11.9 (H) 03/16/2019   HGB 13.5 03/16/2019   HCT 43.9 03/16/2019   MCV 72 (L) 03/16/2019   PLT 318 03/16/2019   Lab Results  Component Value Date   ALT 19 11/13/2019   AST 14 11/13/2019   ALKPHOS 78 11/13/2019   BILITOT 0.4 11/13/2019     Review of Systems  Constitutional: Negative for appetite change, fatigue, fever and unexpected weight change.  HENT: Negative for tinnitus and trouble swallowing.   Eyes: Negative for visual disturbance.  Respiratory: Negative for cough, chest tightness and shortness of breath.   Cardiovascular: Negative for chest pain, palpitations and leg swelling.  Gastrointestinal: Negative for abdominal pain.  Endocrine: Negative for polydipsia and polyuria.  Genitourinary: Negative for dysuria and hematuria.  Musculoskeletal: Positive for back pain. Negative for arthralgias.  Neurological: Negative for tremors, numbness and headaches.  Psychiatric/Behavioral: Negative for dysphoric mood.    Patient Active Problem List   Diagnosis Date Noted  . Stage 3a chronic kidney disease ([place]) 04/21/2020  . Type II diabetes mellitus with complication ([place]) 82/80/0349  . Anatomical narrow angle glaucoma with borderline intraocular pressure 07/15/2017  . Vitamin D deficiency  03/11/2017  . Hypercalcemia 10/02/2016  . Pernicious anemia 04/11/2015  . Carpal tunnel syndrome 01/14/2015  . Hyperlipidemia associated with type 2 diabetes mellitus ([place]) 01/14/2015  . Essential hypertension 01/14/2015  . Climacteric 01/14/2015  . Muscle fatigue  01/14/2015  . Arthritis of hand, degenerative 01/14/2015  . GERD (gastroesophageal reflux disease) 10/29/2011  . Spondylolisthesis 10/29/2011    Allergies  Allergen Reactions  . Atorvastatin     chest tightness  . Tramadol Hcl Nausea And Vomiting    Past Surgical History:  Procedure Laterality Date  . ABDOMINAL HYSTERECTOMY  1981  . APPENDECTOMY  1981  . REPAIR DURAL / CSF LEAK  2002    Social History   Tobacco Use  . Smoking status: Never Smoker  . Smokeless tobacco: Never Used  . Tobacco comment: smoking cessation material not required  Vaping Use  . Vaping Use: Never used  Substance Use Topics  . Alcohol use: No    Alcohol/week: 0.0 standard drinks  . Drug use: No     Medication list has been reviewed and updated.  Current Meds  Medication Sig  . ACCU-CHEK AVIVA PLUS test strip USE AS INSTRUCTED  . Accu-Chek Softclix Lancets lancets USE AS INSTRUCTED  . amLODipine (NORVASC) 10 MG tablet TAKE 1 TABLET EVERY DAY  . ammonium lactate (AMLACTIN) 12 % cream APPLY TOPICALLY AS NEEDED FOR DRY SKIN.  Marland Kitchen aspirin 81 MG tablet Take 1 tablet by mouth daily.  . blood glucose meter kit and supplies Dispense based on patient and insurance preference. Use up to four times daily as directed. (FOR ICD-10 E10.9, E11.9).  Marland Kitchen Blood Glucose Monitoring Suppl (ACCU-CHEK AVIVA PLUS) w/Device KIT USE TWICE DAILY  . Cholecalciferol 2000 units CAPS Take 1 capsule by mouth daily.  . diclofenac Sodium (VOLTAREN) 1 % GEL APPLY 2 GRAMS TOPICALLY 4 (FOUR) TIMES DAILY.  Marland Kitchen ezetimibe (ZETIA) 10 MG tablet TAKE 1 TABLET EVERY DAY  . Ferrous Gluconate 325 (36 FE) MG TABS Take by mouth.  . fluticasone (FLONASE) 50 MCG/ACT nasal spray USE 2 SPRAYS INTO BOTH NOSTRILS DAILY.  Marland Kitchen gabapentin (NEURONTIN) 300 MG capsule Take 1 capsule by mouth at bedtime.  Marland Kitchen glimepiride (AMARYL) 2 MG tablet TAKE 1 TABLET EVERY DAY WITH BREAKFAST  . JANUMET 50-1000 MG tablet TAKE 1 TABLET TWICE DAILY WITH MEALS  . JARDIANCE 25  MG TABS tablet TAKE 1 TABLET EVERY DAY  . Lancet Devices (ACCU-CHEK SOFTCLIX) lancets 1 each by Other route 2 (two) times daily.  . ramipril (ALTACE) 5 MG capsule TAKE 1 CAPSULE EVERY DAY  . rosuvastatin (CRESTOR) 5 MG tablet TAKE 1 TABLET EVERY DAY  . traMADol (ULTRAM) 50 MG tablet 50 mg every 6 (six) hours as needed.   . triamcinolone cream (KENALOG) 0.1 % APPLY TOPICALLY TWICE DAILY  . triamterene-hydrochlorothiazide (MAXZIDE-25) 37.5-25 MG tablet TAKE 1 TABLET EVERY DAY   Current Facility-Administered Medications for the 04/21/20 encounter (Office Visit) with Glean Hess, MD  Medication  . cyanocobalamin ((VITAMIN B-12)) injection 1,000 mcg    PHQ 2/9 Scores 12/10/2019 12/10/2019 11/13/2019 06/01/2019  PHQ - 2 Score 0 0 0 3  PHQ- 9 Score - - 0 5    GAD 7 : Generalized Anxiety Score 11/13/2019  Nervous, Anxious, on Edge 0  Control/stop worrying 0  Worry too much - different things 0  Trouble relaxing 0  Restless 0  Easily annoyed or irritable 0  Afraid - awful might happen 0  Total GAD 7 Score 0  Anxiety Difficulty Not  difficult at all    BP Readings from Last 3 Encounters:  04/21/20 106/64  11/13/19 140/78  05/01/19 134/74    Physical Exam Vitals and nursing note reviewed.  Constitutional:      General: She is not in acute distress.    Appearance: She is well-developed.  HENT:     Head: Normocephalic and atraumatic.  Neck:     Vascular: No carotid bruit.  Cardiovascular:     Rate and Rhythm: Normal rate and regular rhythm.     Pulses: Normal pulses.     Heart sounds: No murmur heard.   Pulmonary:     Effort: Pulmonary effort is normal. No respiratory distress.     Breath sounds: No wheezing or rhonchi.  Musculoskeletal:        General: Normal range of motion.     Cervical back: Normal range of motion.     Right lower leg: No edema.     Left lower leg: No edema.  Lymphadenopathy:     Cervical: No cervical adenopathy.  Skin:    General: Skin is warm and  dry.     Capillary Refill: Capillary refill takes less than 2 seconds.     Findings: No rash.  Neurological:     General: No focal deficit present.     Mental Status: She is alert and oriented to person, place, and time.     Sensory: Sensory deficit (subjective decrease sensation of fingers on both hands) present.     Gait: Gait normal.  Psychiatric:        Behavior: Behavior normal.        Thought Content: Thought content normal.     Wt Readings from Last 3 Encounters:  04/21/20 160 lb (72.6 kg)  11/13/19 164 lb (74.4 kg)  06/01/19 158 lb 6.4 oz (71.8 kg)    BP 106/64 (BP Location: Right Arm, Patient Position: Sitting)   Pulse 70   Temp 98.1 F (36.7 C) (Oral)   Ht 5' 4"  (1.626 m)   Wt 160 lb (72.6 kg)   SpO2 98%   BMI 27.46 kg/m   Assessment and Plan: 1. Essential hypertension Clinically stable exam with well controlled BP. Tolerating medications without side effects at this time. Pt to continue current regimen and low sodium diet; benefits of regular exercise as able discussed. - CBC with Differential/Platelet - TSH  2. Type II diabetes mellitus with complication (HCC) Clinically stable by exam and report without s/s of hypoglycemia. A1C not optimal but patient is on 4 agents.  DM complicated by HTN. Tolerating medications  well without side effects or other concerns. Pt is getting a new meter and needs to begin checking BS daily - Comprehensive metabolic panel - Hemoglobin A1c  3. Hyperlipidemia associated with type 2 diabetes mellitus ([place]) Tolerating statin medication without side effects at this time LDL is at goal of < 70 on current dose Continue same therapy without change at this time.  4. Stage 3a chronic kidney disease (HCC) Continue to limit use of NSAIDS and monitor renal function - VITAMIN D 25 Hydroxy (Vit-D Deficiency, Fractures)  5. Spondylolisthesis of lumbar region Seen by Ortho - using Relafen intermittently with good relief - nabumetone  (RELAFEN) 750 MG tablet; Take 1 tablet (750 mg total) by mouth at bedtime as needed.  Dispense: 30 tablet; Refill: 3  6. Bilateral carpal tunnel syndrome Likely the cause of numbness without pain If sx worsen or become intolerable, I recommend follow up with [facility]  Dr. Astrid Divine   Partially dictated using Dragon software. Any errors are unintentional.  Halina Maidens, MD [facility]  04/21/2020

## 2020-04-22 LAB — CBC WITH DIFFERENTIAL/PLATELET
Basophils Absolute: 0 10*3/uL (ref 0.0–0.2)
Basos: 1 %
EOS (ABSOLUTE): 0.2 10*3/uL (ref 0.0–0.4)
Eos: 4 %
Hematocrit: 42 % (ref 34.0–46.6)
Hemoglobin: 12.8 g/dL (ref 11.1–15.9)
Immature Grans (Abs): 0 10*3/uL (ref 0.0–0.1)
Immature Granulocytes: 0 %
Lymphocytes Absolute: 1.8 10*3/uL (ref 0.7–3.1)
Lymphs: 36 %
MCH: 22.6 pg — ABNORMAL LOW (ref 26.6–33.0)
MCHC: 30.5 g/dL — ABNORMAL LOW (ref 31.5–35.7)
MCV: 74 fL — ABNORMAL LOW (ref 79–97)
Monocytes Absolute: 0.6 10*3/uL (ref 0.1–0.9)
Monocytes: 12 %
Neutrophils Absolute: 2.5 10*3/uL (ref 1.4–7.0)
Neutrophils: 47 %
Platelets: 242 10*3/uL (ref 150–450)
RBC: 5.67 x10E6/uL — ABNORMAL HIGH (ref 3.77–5.28)
RDW: 15.4 % (ref 11.7–15.4)
WBC: 5.2 10*3/uL (ref 3.4–10.8)

## 2020-04-22 LAB — COMPREHENSIVE METABOLIC PANEL
ALT: 22 IU/L (ref 0–32)
AST: 18 IU/L (ref 0–40)
Albumin/Globulin Ratio: 2.1 (ref 1.2–2.2)
Albumin: 4.5 g/dL (ref 3.7–4.7)
Alkaline Phosphatase: 86 IU/L (ref 44–121)
BUN/Creatinine Ratio: 18 (ref 12–28)
BUN: 20 mg/dL (ref 8–27)
Bilirubin Total: <0.2 mg/dL (ref 0.0–1.2)
CO2: 23 mmol/L (ref 20–29)
Calcium: 10.3 mg/dL (ref 8.7–10.3)
Chloride: 101 mmol/L (ref 96–106)
Creatinine, Ser: 1.14 mg/dL — ABNORMAL HIGH (ref 0.57–1.00)
GFR calc Af Amer: 55 mL/min/{1.73_m2} — ABNORMAL LOW (ref >59)
GFR calc non Af Amer: 47 mL/min/{1.73_m2} — ABNORMAL LOW (ref >59)
Globulin, Total: 2.1 g/dL (ref 1.5–4.5)
Glucose: 163 mg/dL — ABNORMAL HIGH (ref 65–99)
Potassium: 4.1 mmol/L (ref 3.5–5.2)
Sodium: 138 mmol/L (ref 134–144)
Total Protein: 6.6 g/dL (ref 6.0–8.5)

## 2020-04-22 LAB — HEMOGLOBIN A1C
Est. average glucose Bld gHb Est-mCnc: 197 mg/dL
Hgb A1c MFr Bld: 8.5 % — ABNORMAL HIGH (ref 4.8–5.6)

## 2020-04-22 LAB — VITAMIN D 25 HYDROXY (VIT D DEFICIENCY, FRACTURES): Vit D, 25-Hydroxy: 41.1 ng/mL (ref 30.0–100.0)

## 2020-04-22 LAB — TSH: TSH: 1.42 u[IU]/mL (ref 0.450–4.500)

## 2020-04-24 ENCOUNTER — Other Ambulatory Visit: Payer: Self-pay | Admitting: Internal Medicine

## 2020-04-24 NOTE — Telephone Encounter (Signed)
Requested Prescriptions  Pending Prescriptions Disp Refills  . Accu-Chek Softclix Lancets lancets [Pharmacy Med Name: ACCU-CHEK SOFTCLIX LANCETS] 300 each 1    Sig: USE AS INSTRUCTED     Endocrinology: Diabetes - Testing Supplies Passed - 04/24/2020  3:54 PM      Passed - Valid encounter within last 12 months    Recent Outpatient Visits          3 days ago Essential hypertension   [facility]an [facility], MD   5 months ago Type II diabetes mellitus with complication [facility])   [facility] Glean Hess, MD   10 months ago Acute non-recurrent maxillary sinusitis   [facility]an [facility], [facility]   11 months ago Type II diabetes mellitus with complication [facility])   [facility] Glean Hess, MD   1 year ago Chest pain, atypical   [facility] Glean Hess, MD      Future Appointments            In 3 months Army Melia Jesse Sans, MD [facility], [facility]

## 2020-04-25 ENCOUNTER — Other Ambulatory Visit: Payer: Self-pay

## 2020-04-25 NOTE — Patient Outreach (Signed)
[facility] [facility]) Care Management  04/25/2020  PRESTON WEILL 1946/01/23 [id number]   Telephone Assessment   Outreach attempt to patient. No answer. RN CM left HIPAA compliant voicemail message along with contact info.      Plan: RN CM will make outreach attempt to patient within the month of November if no return call.   [name], RN,BSN,CCM [place] [facility] Telephonic Care Management Coordinator Direct Phone: [phone] Toll Free: [phone] Fax: [phone]

## 2020-04-29 ENCOUNTER — Other Ambulatory Visit: Payer: Self-pay

## 2020-04-29 ENCOUNTER — Other Ambulatory Visit: Payer: Self-pay | Admitting: Internal Medicine

## 2020-04-29 NOTE — Telephone Encounter (Signed)
Requested Prescriptions  Pending Prescriptions Disp Refills   triamterene-hydrochlorothiazide (MAXZIDE-25) 37.5-25 MG tablet [Pharmacy Med Name: TRIAMTERENE/HYDROCHLOROTHIAZIDE 37.5-25 MG Tablet] 90 tablet 1    Sig: TAKE 1 TABLET EVERY DAY     Cardiovascular: Diuretic Combos Failed - 04/29/2020 10:36 PM      Failed - Cr in normal range and within 360 days    Creatinine, Ser  Date Value Ref Range Status  04/21/2020 1.14 (H) 0.57 - 1.00 mg/dL Final         Passed - K in normal range and within 360 days    Potassium  Date Value Ref Range Status  04/21/2020 4.1 3.5 - 5.2 mmol/L Final         Passed - Na in normal range and within 360 days    Sodium  Date Value Ref Range Status  04/21/2020 138 134 - 144 mmol/L Final         Passed - Ca in normal range and within 360 days    Calcium  Date Value Ref Range Status  04/21/2020 10.3 8.7 - 10.3 mg/dL Final         Passed - Last BP in normal range    BP Readings from Last 1 Encounters:  04/21/20 106/64         Passed - Valid encounter within last 6 months    Recent Outpatient Visits          1 week ago Essential hypertension   [facility] Glean Hess, MD   5 months ago Type II diabetes mellitus with complication [facility])   [facility] Glean Hess, MD   11 months ago Acute non-recurrent maxillary sinusitis   [facility] Glean Hess, MD   12 months ago Type II diabetes mellitus with complication [facility])   [facility] Glean [facility], MD   1 year ago Chest pain, atypical   [facility] Glean [facility], MD      Future Appointments            In 3 weeks Army Melia Jesse Sans, MD [facility], [place]   In 3 months Glean Hess, MD [facility], [facility]            amLODipine ([place]) 10 MG tablet [Pharmacy Med Name: AMLODIPINE BESYLATE 10 MG Tablet] 90 tablet 0    Sig: TAKE 1 TABLET EVERY DAY     Cardiovascular:  Calcium Channel Blockers Passed  - 04/29/2020 10:36 PM      Passed - Last BP in normal range    BP Readings from Last 1 Encounters:  04/21/20 106/64         Passed - Valid encounter within last 6 months    Recent Outpatient Visits          1 week ago Essential hypertension   [facility] Glean [facility], MD   5 months ago Type II diabetes mellitus with complication [facility])   [facility] Glean Hess, MD   11 months ago Acute non-recurrent maxillary sinusitis   [facility] Glean Hess, MD   12 months ago Type II diabetes mellitus with complication [facility])   [facility] Glean [facility], MD   1 year ago Chest pain, atypical   [facility] Glean Hess, MD      Future Appointments            In 3 weeks Glean Hess,  MD [facility], [place]   In 3 months Army Melia, Jesse Sans, MD [facility], [facility]            JARDIANCE 25 MG TABS tablet [Pharmacy Med Name: JARDIANCE 25 MG Tablet] 90 tablet 0    Sig: TAKE 1 TABLET EVERY DAY     Endocrinology:  Diabetes - SGLT2 Inhibitors Failed - 04/29/2020 10:36 PM      Failed - Cr in normal range and within 360 days    Creatinine, Ser  Date Value Ref Range Status  04/21/2020 1.14 (H) 0.57 - 1.00 mg/dL Final         Failed - LDL in normal range and within 360 days    LDL Chol Calc (NIH)  Date Value Ref Range Status  11/13/2019 37 0 - 99 mg/dL Final         Failed - HBA1C is between 0 and 7.9 and within 180 days    Hgb A1c MFr Bld  Date Value Ref Range Status  04/21/2020 8.5 (H) 4.8 - 5.6 % Final    Comment:             Prediabetes: 5.7 - 6.4          Diabetes: >6.4          Glycemic control for adults with diabetes: <7.0          Failed - eGFR in normal range and within 360 days    GFR calc Af Amer  Date Value Ref Range Status  04/21/2020 55 (L) >59 mL/min/1.73 Final    Comment:    **[facility] currently reports eGFR in compliance with the current**   recommendations of the [facility]. [facility] will   update reporting as new guidelines are published from the NKF-ASN   Task force.    GFR calc non Af Amer  Date Value Ref Range Status  04/21/2020 47 (L) >59 mL/min/1.73 Final         Passed - Valid encounter within last 6 months    Recent Outpatient Visits          1 week ago Essential hypertension   [facility], MD   5 months ago Type II diabetes mellitus with complication [facility])   [facility] Glean [facility], MD   11 months ago Acute non-recurrent maxillary sinusitis   [facility] Glean Hess, MD   12 months ago Type II diabetes mellitus with complication [facility])   [facility] Glean Hess, MD   1 year ago Chest pain, atypical   [facility] Glean Hess, MD      Future Appointments            In 3 weeks Glean Hess, MD [facility], [place]   In 3 months Glean Hess, MD [facility]            ramipril (ALTACE) 5 MG capsule [Pharmacy Med Name: RAMIPRIL 5 MG Capsule] 90 capsule 0    Sig: TAKE 1 CAPSULE EVERY DAY     Cardiovascular:  ACE Inhibitors Failed - 04/29/2020 10:36 PM      Failed - Cr in normal range and within 180 days    Creatinine, Ser  Date Value Ref Range Status  04/21/2020 1.14 (H) 0.57 - 1.00 mg/dL Final         Passed - K in normal range and within 180 days  Potassium  Date Value Ref Range Status  04/21/2020 4.1 3.5 - 5.2 mmol/L Final         Passed - Patient is not pregnant      Passed - Last BP in normal range    BP Readings from Last 1 Encounters:  04/21/20 106/64         Passed - Valid encounter within last 6 months    Recent Outpatient Visits          1 week ago Essential hypertension   [facility], MD   5 months ago Type II diabetes mellitus with complication [facility])   [facility] Glean Hess, MD   11 months ago Acute non-recurrent maxillary  sinusitis   [facility], MD   12 months ago Type II diabetes mellitus with complication [facility])   [facility] Glean Hess, MD   1 year ago Chest pain, atypical   [facility] Glean Hess, MD      Future Appointments            In 3 weeks Army Melia Jesse Sans, MD [facility], [place]   In 3 months Army Melia, Jesse Sans, MD [facility], [facility]

## 2020-04-29 NOTE — Patient Outreach (Signed)
[facility]) Care Management  04/29/2020  Sheila Sandoval November 05, 1945 [id number]   Telephone Assessment Quarterly Call    Voicemail message received from patient returning RN CM call. Return call placed to patient.Spoke with patient who states that he has bene doing fairly well. She shares that MD is considering putting her on insulin as her A1C has gone up. She reports that she does not like the idea of having to administer insulin to herself and is willing to do what it takes to not gone on medication. Patient's most recent A1C level 8.5- previous one 8.3. Patient admits that she has been "overdoing it with he sweet and candy." She reports that she will change her food habits and try to do better. She is due for annual eye exam this month dn has an appt. She also goes to se PCP this month as well. She reports back problems remain about the same. She is taking meds to help. She denies any RN CM needs or concerns at this time.   Medications Reviewed Today    Reviewed by Hayden Pedro, RN (Registered Nurse) on 04/29/20 at 1125  Med List Status: <None>  Medication Order Taking? Sig Documenting Provider Last Dose Status Informant  ACCU-CHEK AVIVA PLUS test strip [id number] No USE AS INSTRUCTED Glean Hess, MD Taking Active   Accu-Chek Softclix Lancets lancets [id number]  USE AS INSTRUCTED Glean Hess, MD  Active   amLODipine (NORVASC) 10 MG tablet [id number] No TAKE 1 TABLET EVERY DAY Glean Hess, MD Taking Active   ammonium lactate (AMLACTIN) 12 % cream [id number] No APPLY TOPICALLY AS NEEDED FOR DRY SKIN. Glean Hess, MD Taking Active   aspirin 81 MG tablet [id number] No Take 1 tablet by mouth daily. [provider] Taking Active            Med Note Romana Juniper Dec 10, 2019 11:31 AM)    blood glucose meter kit and supplies [id number] No Dispense based on patient and insurance preference. Use up to four times daily as  directed. (FOR ICD-10 E10.9, E11.9). Glean Hess, MD Taking Active   Blood Glucose Monitoring Suppl (ACCU-CHEK AVIVA PLUS) w/Device Drucie Opitz [id number] No USE TWICE DAILY Glean Hess, MD Taking Active   Cholecalciferol 2000 units CAPS [id number] No Take 1 capsule by mouth daily. [provider] Taking Active   cyanocobalamin ((VITAMIN B-12)) injection 1,000 mcg [id number]   Juline Patch, MD  Active   diclofenac Sodium (VOLTAREN) 1 % GEL [id number] No APPLY 2 GRAMS TOPICALLY 4 (FOUR) TIMES DAILY. Glean Hess, MD Taking Active   ezetimibe (ZETIA) 10 MG tablet [id number] No TAKE 1 TABLET EVERY DAY Glean Hess, MD Taking Active   Ferrous Gluconate 325 (36 FE) MG TABS [id number] No Take by mouth. [provider] Taking Active            Med Note Winfield Cunas, ELISABETH A   Wed Sep 07, 2017 11:13 AM) Taking 1 tablet by mouth once daily  fluticasone (FLONASE) 50 MCG/ACT nasal spray [id number] No USE 2 SPRAYS INTO BOTH NOSTRILS DAILY. Glean Hess, MD Taking Active   gabapentin (NEURONTIN) 300 MG capsule [id number] No Take 1 capsule by mouth 3 (three) times daily. As needed [provider] Taking Active Self  glimepiride (AMARYL) 2 MG tablet [id number] No TAKE 1 TABLET EVERY DAY WITH BREAKFAST Glean Hess, MD Taking Active   JANUMET 50-1000 MG tablet [id number] No TAKE  1 TABLET TWICE DAILY WITH MEALS Glean Hess, MD Taking Active   JARDIANCE 25 MG TABS tablet [id number] No TAKE 1 TABLET EVERY DAY Glean Hess, MD Taking Active   Lancet Devices [facility]) lancets [id number] No 1 each by Other route 2 (two) times daily. Glean Hess, MD Taking Active   nabumetone (RELAFEN) 750 MG tablet [id number]  Take 1 tablet (750 mg total) by mouth at bedtime as needed. Glean Hess, MD  Active   ramipril (ALTACE) 5 MG capsule [id number] No TAKE 1 CAPSULE EVERY DAY Glean Hess, MD Taking Active   rosuvastatin (CRESTOR) 5 MG tablet [id number] No  TAKE 1 TABLET EVERY DAY Glean Hess, MD Taking Active   traMADol (ULTRAM) 50 MG tablet [id number] No 50 mg every 6 (six) hours as needed.  [provider] Taking Active   triamcinolone cream (KENALOG) 0.1 % [id number] No APPLY TOPICALLY TWICE DAILY Glean Hess, MD Taking Active   triamterene-hydrochlorothiazide [facility]) 37.5-25 MG tablet [id number] No TAKE 1 TABLET EVERY DAY Glean Hess, MD Taking Active           Goals Addressed            This Visit's Progress   . Monitor and Manage My Blood Sugar       Follow Up Date Jan 2022   - check blood sugar at prescribed times - check blood sugar if I feel it is too high or too low - enter blood sugar readings and medication or insulin into daily log - take the blood sugar meter to all doctor visits    Why is this important?   Checking your blood sugar at home helps to keep it from getting very high or very low.  Writing the results in a diary or log helps the doctor know how to care for you.  Your blood sugar log should have the time, date and the results.  Also, write down the amount of insulin or other medicine that you take.  Other information, like what you ate, exercise done and how you were feeling, will also be helpful.     Notes:     . Obtain Eye Exam       Follow Up Date Jan 2022   - keep appointment with eye doctor    Why is this important?   Eye check-ups are important when you have diabetes.  Vision loss can be prevented.    Notes: Patient will complete annual diabetic eye exam    . Set My Target A1C       Follow Up Date Jan 2022   - set target A1C Patient will discuss frequency and A1C goal level with PCP at next appt    Why is this important?   Your target A1C is decided together by you and your doctor.  It is based on several things like your age and other health issues.    Notes:       Plan: RN CM discussed with patient next outreach within the month of Jan . Patient gave  verbal consent and in agreement with RN CM follow up and timeframe. Patient aware that they may contact RN CM sooner for any issues or concerns. RN CM will send quarterly update to PCP.   [name], RN,BSN,CCM [facility] Telephonic Care Management Coordinator Direct Phone: [phone] Toll Free: [phone] Fax: [phone]

## 2020-04-30 ENCOUNTER — Other Ambulatory Visit: Payer: Self-pay | Admitting: Internal Medicine

## 2020-04-30 MED ORDER — ACCU-CHEK AVIVA PLUS VI STRP: ORAL_STRIP | 1 refills | Status: DC

## 2020-04-30 NOTE — Telephone Encounter (Signed)
Requested Prescriptions  Pending Prescriptions Disp Refills  . glucose blood (ACCU-CHEK AVIVA PLUS) test strip 300 strip 1    Sig: USE AS INSTRUCTED     Endocrinology: Diabetes - Testing Supplies Passed - 04/30/2020 12:53 PM      Passed - Valid encounter within last 12 months    Recent Outpatient Visits          1 week ago Essential hypertension   [facility] Glean Hess, MD   5 months ago Type II diabetes mellitus with complication [facility])   [facility] Glean Hess, MD   11 months ago Acute non-recurrent maxillary sinusitis   [facility] Glean Hess, MD   1 year ago Type II diabetes mellitus with complication [facility])   [facility] Glean Hess, MD   1 year ago Chest pain, atypical   [facility] Glean Hess, MD      Future Appointments            In 3 weeks Army Melia Jesse Sans, MD [facility], [place]   In 3 months Army Melia Jesse Sans, MD [facility], [facility]

## 2020-04-30 NOTE — Telephone Encounter (Signed)
Medication Refill - Medication: ACCU-CHEK AVIVA PLUS test strip BD Swab regular pad     Preferred Pharmacy (with phone number or street name):  [place] [place], [place] Phone:  [phone]  Fax:  [phone]       Agent: Please be advised that RX refills may take up to 3 business days. We ask that you follow-up with your pharmacy.

## 2020-05-01 ENCOUNTER — Other Ambulatory Visit: Payer: Self-pay | Admitting: Internal Medicine

## 2020-05-01 DIAGNOSIS — R21 Rash and other nonspecific skin eruption: Secondary | ICD-10-CM

## 2020-05-07 ENCOUNTER — Telehealth: Payer: Self-pay

## 2020-05-07 NOTE — Telephone Encounter (Signed)
Pt wanted to know if she should stop taking Asprin. Pt said she seen something on tv that stated to stop taking it. Told pt that we are unaware of any changes for Asprin that she can continue taking it. Pt verbalized understanding.  KP

## 2020-05-07 NOTE — Telephone Encounter (Unsigned)
Copied from [place] [id number]. Topic: General - Other >> May 07, 2020  1:31 PM Celene Kras wrote: Reason for CRM: Pt called and is requesting to speak with nurse regarding her medications. Please advise.

## 2020-05-23 ENCOUNTER — Ambulatory Visit: Payer: Self-pay | Admitting: Internal Medicine

## 2020-05-29 ENCOUNTER — Ambulatory Visit: Payer: Self-pay | Admitting: Internal Medicine

## 2020-06-02 ENCOUNTER — Ambulatory Visit: Payer: Self-pay

## 2020-06-13 ENCOUNTER — Ambulatory Visit (INDEPENDENT_AMBULATORY_CARE_PROVIDER_SITE_OTHER): Payer: Medicare PPO

## 2020-06-13 ENCOUNTER — Other Ambulatory Visit: Payer: Self-pay

## 2020-06-13 DIAGNOSIS — E538 Deficiency of other specified B group vitamins: Secondary | ICD-10-CM | POA: Diagnosis not present

## 2020-06-13 MED ORDER — CYANOCOBALAMIN 1000 MCG/ML IJ SOLN
1000.0000 ug | Freq: Once | INTRAMUSCULAR | Status: AC
  Administered 2020-06-13: 1000 ug via INTRAMUSCULAR

## 2020-06-17 ENCOUNTER — Other Ambulatory Visit: Payer: Self-pay

## 2020-06-17 ENCOUNTER — Encounter: Payer: Self-pay | Admitting: Internal Medicine

## 2020-06-17 ENCOUNTER — Ambulatory Visit (INDEPENDENT_AMBULATORY_CARE_PROVIDER_SITE_OTHER): Payer: Medicare PPO | Admitting: Internal Medicine

## 2020-06-17 VITALS — BP 122/76 | HR 73 | Temp 98.0°F | Ht 64.0 in | Wt 156.0 lb

## 2020-06-17 DIAGNOSIS — E118 Type 2 diabetes mellitus with unspecified complications: Secondary | ICD-10-CM | POA: Diagnosis not present

## 2020-06-17 NOTE — Progress Notes (Signed)
Date:  06/17/2020   Name:  Sheila Sandoval   DOB:  03/15/1946   MRN:  [id number]   Chief Complaint: Diabetes (f/u- injection samples, last reading- 157 )  Diabetes She presents for her follow-up diabetic visit. She has type 2 diabetes mellitus. Her disease course has been worsening. Pertinent negatives for hypoglycemia include no dizziness or headaches. Pertinent negatives for diabetes include no chest pain and no fatigue. Symptoms are improving. Current diabetic treatments: janumet, jardiance, glimepiride. She is compliant with treatment all of the time. She monitors blood glucose at home 1-2 x per day. Her breakfast blood glucose is taken between 7-8 am. Her breakfast blood glucose range is generally 110-130 mg/dl.  Her A1C is consistently > 8 but she is very hesitant to start insulin.  She wants to work on diet and weight loss - has made significant changes since the last visit.  She has cut out all the bad carbs and is exercising several days per week.  She has lost 4 lbs.  Lab Results  Component Value Date   CREATININE 1.14 (H) 04/21/2020   BUN 20 04/21/2020   NA 138 04/21/2020   K 4.1 04/21/2020   CL 101 04/21/2020   CO2 23 04/21/2020   Lab Results  Component Value Date   CHOL 116 11/13/2019   HDL 52 11/13/2019   LDLCALC 37 11/13/2019   TRIG 167 (H) 11/13/2019   CHOLHDL 2.2 11/13/2019   Lab Results  Component Value Date   TSH 1.420 04/21/2020   Lab Results  Component Value Date   HGBA1C 8.5 (H) 04/21/2020   Lab Results  Component Value Date   WBC 5.2 04/21/2020   HGB 12.8 04/21/2020   HCT 42.0 04/21/2020   MCV 74 (L) 04/21/2020   PLT 242 04/21/2020   Lab Results  Component Value Date   ALT 22 04/21/2020   AST 18 04/21/2020   ALKPHOS 86 04/21/2020   BILITOT <0.2 04/21/2020     Review of Systems  Constitutional: Negative for chills, fatigue and fever.  Eyes: Negative for visual disturbance.  Respiratory: Negative for cough, chest tightness and  shortness of breath.   Cardiovascular: Negative for chest pain and leg swelling.  Skin: Negative for color change and rash.  Neurological: Negative for dizziness and headaches.  Psychiatric/Behavioral: Negative for sleep disturbance.    Patient Active Problem List   Diagnosis Date Noted  . Stage 3a chronic kidney disease ([place]) 04/21/2020  . Type II diabetes mellitus with complication ([place]) 07/62/2633  . Anatomical narrow angle glaucoma with borderline intraocular pressure 07/15/2017  . Vitamin D deficiency 03/11/2017  . Hypercalcemia 10/02/2016  . Pernicious anemia 04/11/2015  . Carpal tunnel syndrome 01/14/2015  . Hyperlipidemia associated with type 2 diabetes mellitus ([place]) 01/14/2015  . Essential hypertension 01/14/2015  . Climacteric 01/14/2015  . Muscle fatigue 01/14/2015  . Arthritis of hand, degenerative 01/14/2015  . GERD (gastroesophageal reflux disease) 10/29/2011  . Spondylolisthesis 10/29/2011    Allergies  Allergen Reactions  . Atorvastatin     chest tightness  . Tramadol Hcl Nausea And Vomiting    Past Surgical History:  Procedure Laterality Date  . ABDOMINAL HYSTERECTOMY  1981  . APPENDECTOMY  1981  . REPAIR DURAL / CSF LEAK  2002    Social History   Tobacco Use  . Smoking status: Never Smoker  . Smokeless tobacco: Never Used  . Tobacco comment: smoking cessation material not required  Vaping Use  . Vaping Use: Never used  Substance Use Topics  . Alcohol use: No    Alcohol/week: 0.0 standard drinks  . Drug use: No     Medication list has been reviewed and updated.  Current Meds  Medication Sig  . Accu-Chek Softclix Lancets lancets USE AS INSTRUCTED  . amLODipine (NORVASC) 10 MG tablet TAKE 1 TABLET EVERY DAY  . ammonium lactate (AMLACTIN) 12 % cream APPLY TOPICALLY AS NEEDED FOR DRY SKIN.  Marland Kitchen aspirin 81 MG tablet Take 1 tablet by mouth daily.  . blood glucose meter kit and supplies Dispense based on patient and insurance preference. Use up  to four times daily as directed. (FOR ICD-10 E10.9, E11.9).  Marland Kitchen Blood Glucose Monitoring Suppl (ACCU-CHEK AVIVA PLUS) w/Device KIT USE TWICE DAILY  . Cholecalciferol 2000 units CAPS Take 1 capsule by mouth daily.  . diclofenac Sodium (VOLTAREN) 1 % GEL APPLY 2 GRAMS TOPICALLY 4 (FOUR) TIMES DAILY.  Marland Kitchen ezetimibe (ZETIA) 10 MG tablet TAKE 1 TABLET EVERY DAY  . Ferrous Gluconate 325 (36 FE) MG TABS Take by mouth.  . fluticasone (FLONASE) 50 MCG/ACT nasal spray USE 2 SPRAYS INTO BOTH NOSTRILS DAILY.  Marland Kitchen gabapentin (NEURONTIN) 300 MG capsule Take 1 capsule by mouth 3 (three) times daily. As needed  . glimepiride (AMARYL) 2 MG tablet TAKE 1 TABLET EVERY DAY WITH BREAKFAST  . glucose blood (ACCU-CHEK AVIVA PLUS) test strip USE AS INSTRUCTED  . JANUMET 50-1000 MG tablet TAKE 1 TABLET TWICE DAILY WITH MEALS  . JARDIANCE 25 MG TABS tablet TAKE 1 TABLET EVERY DAY  . Lancet Devices (ACCU-CHEK SOFTCLIX) lancets 1 each by Other route 2 (two) times daily.  . nabumetone (RELAFEN) 750 MG tablet Take 1 tablet (750 mg total) by mouth at bedtime as needed.  . ramipril (ALTACE) 5 MG capsule TAKE 1 CAPSULE EVERY DAY  . rosuvastatin (CRESTOR) 5 MG tablet TAKE 1 TABLET EVERY DAY  . triamcinolone cream (KENALOG) 0.1 % APPLY TOPICALLY TWICE DAILY  . triamterene-hydrochlorothiazide (MAXZIDE-25) 37.5-25 MG tablet TAKE 1 TABLET EVERY DAY   Current Facility-Administered Medications for the 06/17/20 encounter (Office Visit) with Glean Hess, MD  Medication  . cyanocobalamin ((VITAMIN B-12)) injection 1,000 mcg    PHQ 2/9 Scores 06/17/2020 12/10/2019 12/10/2019 11/13/2019  PHQ - 2 Score 0 0 0 0  PHQ- 9 Score 0 - - 0    GAD 7 : Generalized Anxiety Score 06/17/2020 11/13/2019  Nervous, Anxious, on Edge 0 0  Control/stop worrying 0 0  Worry too much - different things 0 0  Trouble relaxing 0 0  Restless 0 0  Easily annoyed or irritable 0 0  Afraid - awful might happen 0 0  Total GAD 7 Score 0 0  Anxiety Difficulty  - Not difficult at all    BP Readings from Last 3 Encounters:  06/17/20 122/76  04/21/20 106/64  11/13/19 140/78    Physical Exam Vitals and nursing note reviewed.  Constitutional:      General: She is not in acute distress.    Appearance: Normal appearance. She is well-developed.  HENT:     Head: Normocephalic and atraumatic.  Cardiovascular:     Rate and Rhythm: Normal rate and regular rhythm.  Pulmonary:     Effort: Pulmonary effort is normal. No respiratory distress.     Breath sounds: No wheezing or rhonchi.  Musculoskeletal:        General: Normal range of motion.     Right lower leg: No edema.     Left lower leg: No  edema.  Skin:    General: Skin is warm and dry.     Findings: No rash.  Neurological:     Mental Status: She is alert and oriented to person, place, and time.  Psychiatric:        Mood and Affect: Mood normal.     Wt Readings from Last 3 Encounters:  06/17/20 156 lb (70.8 kg)  04/21/20 160 lb (72.6 kg)  11/13/19 164 lb (74.4 kg)    BP 122/76   Pulse 73   Temp 98 F (36.7 C) (Oral)   Ht 5' 4"  (1.626 m)   Wt 156 lb (70.8 kg)   SpO2 97%   BMI 26.78 kg/m   Assessment and Plan: 1. Type II diabetes mellitus with complication [facility]) Long discussion about the natural course of diabetes, regardless of diet changes and exercise. However, she has lost some weight and seen her glucoses decrease.  Recommend that she continue with a goal weight of 150 lbs here. Monitor BS twice a day.  Increase exercise to daily. Will recheck in 2 months and if still not under 8, will discuss insulin again.   Partially dictated using Editor, commissioning. Any errors are unintentional.  Halina Maidens, MD [facility]  06/17/2020

## 2020-06-18 ENCOUNTER — Other Ambulatory Visit: Payer: Self-pay | Admitting: Internal Medicine

## 2020-06-18 DIAGNOSIS — R21 Rash and other nonspecific skin eruption: Secondary | ICD-10-CM

## 2020-07-09 ENCOUNTER — Other Ambulatory Visit: Payer: Self-pay | Admitting: Internal Medicine

## 2020-07-12 ENCOUNTER — Other Ambulatory Visit: Payer: Self-pay | Admitting: Internal Medicine

## 2020-07-12 DIAGNOSIS — E782 Mixed hyperlipidemia: Secondary | ICD-10-CM

## 2020-07-12 NOTE — Telephone Encounter (Signed)
Requested Prescriptions  Pending Prescriptions Disp Refills  . rosuvastatin (CRESTOR) 5 MG tablet [Pharmacy Med Name: ROSUVASTATIN CALCIUM 5 MG Tablet] 90 tablet 3    Sig: TAKE 1 TABLET EVERY DAY     Cardiovascular:  Antilipid - Statins Failed - 07/12/2020  2:13 PM      Failed - LDL in normal range and within 360 days    LDL Chol Calc (NIH)  Date Value Ref Range Status  11/13/2019 37 0 - 99 mg/dL Final         Failed - Triglycerides in normal range and within 360 days    Triglycerides  Date Value Ref Range Status  11/13/2019 167 (H) 0 - 149 mg/dL Final         Passed - Total Cholesterol in normal range and within 360 days    Cholesterol, Total  Date Value Ref Range Status  11/13/2019 116 100 - 199 mg/dL Final         Passed - HDL in normal range and within 360 days    HDL  Date Value Ref Range Status  11/13/2019 52 >39 mg/dL Final         Passed - Patient is not pregnant      Passed - Valid encounter within last 12 months    Recent Outpatient Visits          3 weeks ago Type II diabetes mellitus with complication [facility])   [facility] Glean [facility], MD   2 months ago Essential hypertension   Stigler, Laura H, MD   8 months ago Type II diabetes mellitus with complication [facility])   [facility] Glean Hess, MD   1 year ago Acute non-recurrent maxillary sinusitis   [facility] Glean Hess, MD   1 year ago Type II diabetes mellitus with complication [facility])   [facility] Glean Hess, MD      Future Appointments            In 1 month Army Melia, Jesse Sans, MD [facility], [facility]

## 2020-08-04 ENCOUNTER — Other Ambulatory Visit: Payer: Self-pay

## 2020-08-04 NOTE — Patient Outreach (Signed)
[facility]) Care Management  08/04/2020  Sheila Sandoval 14-Mar-1946 [id number]   Telephone Assessment    Outreach attempt to patient. No answer after multiple rings and voicemail not set up.    Plan: RN CM will make outreach attempt to patient within the month of March.   [name], RN,BSN,CCM [facility] Telephonic Care Management Coordinator Direct Phone: [phone] Toll Free: [phone] Fax: [phone]

## 2020-08-06 ENCOUNTER — Other Ambulatory Visit: Payer: Self-pay | Admitting: Internal Medicine

## 2020-08-06 DIAGNOSIS — R21 Rash and other nonspecific skin eruption: Secondary | ICD-10-CM

## 2020-08-15 ENCOUNTER — Encounter: Payer: Medicare PPO | Admitting: Internal Medicine

## 2020-08-21 DIAGNOSIS — M170 Bilateral primary osteoarthritis of knee: Secondary | ICD-10-CM | POA: Diagnosis not present

## 2020-08-25 ENCOUNTER — Other Ambulatory Visit: Payer: Self-pay | Admitting: Internal Medicine

## 2020-09-15 ENCOUNTER — Other Ambulatory Visit: Payer: Self-pay | Admitting: Internal Medicine

## 2020-09-18 ENCOUNTER — Other Ambulatory Visit: Payer: Self-pay | Admitting: Internal Medicine

## 2020-09-18 ENCOUNTER — Other Ambulatory Visit: Payer: Self-pay

## 2020-09-18 ENCOUNTER — Telehealth: Payer: Self-pay | Admitting: Internal Medicine

## 2020-09-18 DIAGNOSIS — M4316 Spondylolisthesis, lumbar region: Secondary | ICD-10-CM

## 2020-09-18 MED ORDER — EZETIMIBE 10 MG PO TABS: 10.0000 mg | ORAL_TABLET | Freq: Every day | ORAL | 0 refills | Status: DC

## 2020-09-18 MED ORDER — NABUMETONE 750 MG PO TABS: 750.0000 mg | ORAL_TABLET | Freq: Every evening | ORAL | 3 refills | Status: AC | PRN

## 2020-09-18 NOTE — Telephone Encounter (Signed)
Copied from [place] [id number]. Topic: Quick Communication - Rx Refill/Question >> Sep 18, 2020  2:34 PM Leward Quan A wrote: Medication: nabumetone (RELAFEN) 750 MG tablet Per patient would like refill today please  Has the patient contacted their pharmacy? Yes.   (Agent: If no, request that the patient contact the pharmacy for the refill.) (Agent: If yes, when and what did the pharmacy advise?)  Preferred Pharmacy (with phone number or street name): [place], [place]: Please be advised that RX refills may take up to 3 business days. We ask that you follow-up with your pharmacy.

## 2020-09-18 NOTE — Telephone Encounter (Signed)
Refill was sent in. (90 day supply)  KP

## 2020-09-18 NOTE — Telephone Encounter (Signed)
Sheila Sandoval with [facility] is unable to e-scribe that medication they are not 100% able to e-scribe . Sheila Sandoval is calling an patient needs a refill on ezetimibe 10 mg a 90 day supply

## 2020-09-26 ENCOUNTER — Other Ambulatory Visit: Payer: Self-pay | Admitting: Internal Medicine

## 2020-09-26 DIAGNOSIS — R21 Rash and other nonspecific skin eruption: Secondary | ICD-10-CM

## 2020-09-26 DIAGNOSIS — H527 Unspecified disorder of refraction: Secondary | ICD-10-CM | POA: Diagnosis not present

## 2020-09-26 DIAGNOSIS — E119 Type 2 diabetes mellitus without complications: Secondary | ICD-10-CM | POA: Diagnosis not present

## 2020-09-26 DIAGNOSIS — H18513 Endothelial corneal dystrophy, bilateral: Secondary | ICD-10-CM | POA: Diagnosis not present

## 2020-09-26 DIAGNOSIS — H2513 Age-related nuclear cataract, bilateral: Secondary | ICD-10-CM | POA: Diagnosis not present

## 2020-09-26 DIAGNOSIS — H04123 Dry eye syndrome of bilateral lacrimal glands: Secondary | ICD-10-CM | POA: Diagnosis not present

## 2020-10-08 ENCOUNTER — Other Ambulatory Visit: Payer: Self-pay | Admitting: Internal Medicine

## 2020-10-08 ENCOUNTER — Other Ambulatory Visit: Payer: Self-pay

## 2020-10-08 NOTE — Patient Outreach (Signed)
[facility] Care Management  10/08/2020  Sheila Sandoval 1946-05-10 [id number]   Telephone Assessment Quarterly Call Annual Assessment   Successful outreach to patient. Spoke with patient regarding being unable to reach her for the past several months. She admits to not always answering her phone and returning calls at times. She voices that she is doing fairly well. She remains independent with ADLs/IADLs. No recent falls. She is not using any DME at present. She continues to drive herself to medical appts. She has not seen PCP this year and due to make an appt. She denies any RN CM needs or concerns at this time.    Medications Reviewed Today    Reviewed by Hayden Pedro, RN (Registered Nurse) on 10/08/20 at 1100  Med List Status: <None>  Medication Order Taking? Sig Documenting Provider Last Dose Status Informant  Accu-Chek Softclix Lancets lancets [id number]  USE AS INSTRUCTED Glean Hess, MD  Active   amLODipine (NORVASC) 10 MG tablet [id number] Yes TAKE 1 TABLET EVERY DAY Glean Hess, MD Taking Active   ammonium lactate (AMLACTIN) 12 % cream [id number] Yes APPLY TOPICALLY AS NEEDED FOR DRY SKIN. Glean Hess, MD Taking Active   aspirin 81 MG tablet [id number] Yes Take 1 tablet by mouth daily. [provider] Taking Active            Med Note Romana Juniper Dec 10, 2019 11:31 AM)    blood glucose meter kit and supplies [id number]  Dispense based on patient and insurance preference. Use up to four times daily as directed. (FOR ICD-10 E10.9, E11.9). Glean Hess, MD  Active   Blood Glucose Monitoring Suppl (ACCU-CHEK AVIVA PLUS) w/Device Drucie Opitz [id number] Yes USE TWICE DAILY Glean Hess, MD Taking Active   Cholecalciferol 2000 units CAPS [id number] Yes Take 1 capsule by mouth daily. [provider] Taking Active   cyanocobalamin ((VITAMIN B-12)) injection 1,000 mcg [id number]   Juline Patch, MD  Active    diclofenac Sodium (VOLTAREN) 1 % GEL [id number] Yes APPLY 2 GRAMS TOPICALLY 4 (FOUR) TIMES DAILY. Glean Hess, MD Taking Active   ezetimibe (ZETIA) 10 MG tablet [id number] Yes Take 1 tablet (10 mg total) by mouth daily. Glean Hess, MD Taking Active   Ferrous Gluconate 325 (36 FE) MG TABS [id number] Yes Take by mouth. [provider] Taking Active            Med Note Winfield Cunas, ELISABETH A   Wed Sep 07, 2017 11:13 AM) Taking 1 tablet by mouth once daily  fluticasone (FLONASE) 50 MCG/ACT nasal spray [id number] Yes USE 2 SPRAYS INTO BOTH NOSTRILS DAILY. Glean Hess, MD Taking Active   gabapentin (NEURONTIN) 300 MG capsule [id number] Yes Take 1 capsule by mouth 3 (three) times daily. As needed [provider] Taking Active Self  glimepiride (AMARYL) 2 MG tablet [id number] Yes TAKE 1 TABLET EVERY DAY WITH BREAKFAST Glean Hess, MD Taking Active   glucose blood (ACCU-CHEK AVIVA PLUS) test strip [id number]  USE AS INSTRUCTED Glean Hess, MD  Active   JANUMET 50-1000 MG tablet [id number] Yes TAKE 1 TABLET TWICE DAILY WITH MEALS Glean Hess, MD Taking Active   JARDIANCE 25 MG TABS tablet [id number] Yes TAKE 1 TABLET EVERY DAY Glean Hess, MD Taking Active   Lancet Devices ([place]) lancets [id number]  1 each by Other route 2 (two) times daily. Glean Hess, MD  Active   nabumetone (  RELAFEN) 750 MG tablet [id number] Yes Take 1 tablet (750 mg total) by mouth at bedtime as needed. Glean Hess, MD Taking Active   ramipril (ALTACE) 5 MG capsule [id number] Yes TAKE 1 CAPSULE EVERY DAY Glean Hess, MD Taking Active   rosuvastatin (CRESTOR) 5 MG tablet [id number] Yes TAKE 1 TABLET EVERY DAY Glean Hess, MD Taking Active   triamcinolone (KENALOG) 0.1 % [id number] Yes APPLY TOPICALLY TWICE DAILY Glean Hess, MD Taking Active   triamterene-hydrochlorothiazide [facility]) 37.5-25 MG tablet [id number] Yes TAKE 1 TABLET EVERY DAY  Glean Hess, MD Taking Active           Fall Risk  06/17/2020 04/29/2020 12/10/2019 12/10/2019 11/13/2019  Falls in the past year? 0 1 1 0 0  Comment - - - - -  Number falls in past yr: - 0 0 0 0  Injury with Fall? - 0 0 0 0  Comment - - - - -  Risk Factor Category  - - - - -  Comment - - - - -  Risk for fall due to : - History of fall(s);Medication side effect History of fall(s);Medication side effect No Fall Risks No Fall Risks  Risk for fall due to: Comment - - - - -  Follow up Falls evaluation completed Falls evaluation completed;Education provided Falls evaluation completed;Education provided Falls prevention discussed Falls evaluation completed   Depression screen [facility] 2/9 10/08/2020 06/17/2020 12/10/2019 12/10/2019 11/13/2019  Decreased Interest 0 0 0 0 0  Down, Depressed, Hopeless 0 0 0 0 0  PHQ - 2 Score 0 0 0 0 0  Altered sleeping - 0 - - 0  Tired, decreased energy - 0 - - 0  Change in appetite - 0 - - 0  Feeling bad or failure about yourself  - 0 - - 0  Trouble concentrating - 0 - - 0  Moving slowly or fidgety/restless - 0 - - 0  Suicidal thoughts - 0 - - 0  PHQ-9 Score - 0 - - 0  Difficult doing work/chores - - - - Not difficult at all  Some recent data might be hidden   SDOH Screenings   Alcohol Screen: Low Risk   . Last Alcohol Screening Score (AUDIT): 0  Depression (PHQ2-9): Low Risk   . PHQ-2 Score: 0  Financial Resource Strain: High Risk  . Difficulty of Paying Living Expenses: Hard  Food Insecurity: No Food Insecurity  . Worried About Charity fundraiser in the Last Year: Never true  . Ran Out of Food in the Last Year: Never true  Housing: Low Risk   . Last Housing Risk Score: 0  Physical Activity: Inactive  . Days of Exercise per Week: 0 days  . Minutes of Exercise per Session: 0 min  Social Connections: Moderately Integrated  . Frequency of Communication with Friends and Family: More than three times a week  . Frequency of Social Gatherings with  Friends and Family: More than three times a week  . Attends Religious Services: More than 4 times per year  . Active Member of Clubs or Organizations: Yes  . Attends Archivist Meetings: More than 4 times per year  . Marital Status: Widowed  Stress: No Stress Concern Present  . Feeling of Stress : Not at all  Tobacco Use: Low Risk   . Smoking Tobacco Use: Never Smoker  . Smokeless Tobacco Use: Never Used  Transportation Needs: No Transportation Needs  . Lack of Transportation (  Medical): No  . Lack of Transportation (Non-Medical): No   Goals Addressed              This Visit's Progress   .  COMPLETED: (THN)Monitor and Manage My Blood Sugar (pt-stated)        Timeframe:  Long-Range Goal Priority:  High Start Date:   04/29/20                          Expected End Date: Jan 2022                      Follow Up Date Jan 2022   - check blood sugar at prescribed times - check blood sugar if I feel it is too high or too low - enter blood sugar readings and medication or insulin into daily log - take the blood sugar meter to all doctor visits    Why is this important?   Checking your blood sugar at home helps to keep it from getting very high or very low.  Writing the results in a diary or log helps the doctor know how to care for you.  Your blood sugar log should have the time, date and the results.  Also, write down the amount of insulin or other medicine that you take.  Other information, like what you ate, exercise done and how you were feeling, will also be helpful.     Notes:  10/08/20-Patient reports consistently checking cbgs in the home.     .  (THN)Set My Target A1C (pt-stated)        Timeframe:  Long-Range Goal Priority:  High Start Date:  04/29/20                           Expected End Date:  June 2022                    Follow Up Date June 2022   - set target A1C Patient will discuss frequency and A1C goal level with PCP at next appt    Why is this  important?   Your target A1C is decided together by you and your doctor.  It is based on several things like your age and other health issues.    Notes: 10/08/2020-Patient due for PCP appt and labwork. Last A1C on file 8.5(Sept 2021). She reports she is monitoring cbgs in the home about 3-4x/day. Blood sugars ranging in the 130s-150s per pt report.     .  (THN)Track and Manage My Blood Pressure-Hypertension (pt-stated)        Timeframe:  Long-Range Goal Priority:  High Start Date:   10/08/2020                          Expected End Date:    June 2022                   Follow Up Date June 2022   - check blood pressure daily - write blood pressure results in a log or diary    Why is this important?    You won't feel high blood pressure, but it can still hurt your blood vessels.   High blood pressure can cause heart or kidney problems. It can also cause a stroke.   Making lifestyle changes like losing a little weight or eating less salt  will help.   Checking your blood pressure at home and at different times of the day can help to control blood pressure.   If the doctor prescribes medicine remember to take it the way the doctor ordered.   Call the office if you cannot afford the medicine or if there are questions about it.     Notes:  10/08/2020-Patient admits tat she has not been consistently checking BP in the home although she has machine. She was encouraged t do so. She voices med adherence to BP meds.     [facility] Eye Exam (pt-stated)        Timeframe:  Long-Range Goal Priority:  Medium Start Date:   04/29/20                          Expected End Date:  June 2022                     Follow Up Date June 2022   -patient will make and keep annual eye exam appt    Why is this important?   Eye check-ups are important when you have diabetes.  Vision loss can be prevented.    Notes:  10/08/2020-Patient due for annual preventive exams and needs to make appts.           Plan: RN CM discussed with patient next outreach within the month of June. Patient gave verbal consent and in agreement with RN CM follow up and timeframe. Patient aware that they may contact RN CM sooner for any issues or concerns. RN CM reviewed goals and plan of care with patient. Patient agrees to care plan and follow up. RN CM will send quarterly update to PCP.  [name], RN,BSN,CCM [place] Management Telephonic Care Management Coordinator Direct Phone: [phone] Toll Free: [phone] Fax: [phone]

## 2020-10-10 ENCOUNTER — Ambulatory Visit: Payer: Medicare PPO | Admitting: Internal Medicine

## 2020-10-28 DIAGNOSIS — Z1231 Encounter for screening mammogram for malignant neoplasm of breast: Secondary | ICD-10-CM | POA: Diagnosis not present

## 2020-10-28 DIAGNOSIS — Z803 Family history of malignant neoplasm of breast: Secondary | ICD-10-CM | POA: Diagnosis not present

## 2020-10-29 ENCOUNTER — Ambulatory Visit: Payer: Medicare PPO | Admitting: Internal Medicine

## 2020-10-31 ENCOUNTER — Encounter: Payer: Self-pay | Admitting: Internal Medicine

## 2020-10-31 ENCOUNTER — Ambulatory Visit: Payer: Medicare PPO | Admitting: Internal Medicine

## 2020-10-31 ENCOUNTER — Other Ambulatory Visit: Payer: Self-pay

## 2020-10-31 VITALS — BP 106/74 | HR 63 | Temp 98.1°F | Ht 64.0 in | Wt 160.0 lb

## 2020-10-31 DIAGNOSIS — E538 Deficiency of other specified B group vitamins: Secondary | ICD-10-CM | POA: Diagnosis not present

## 2020-10-31 DIAGNOSIS — E118 Type 2 diabetes mellitus with unspecified complications: Secondary | ICD-10-CM | POA: Diagnosis not present

## 2020-10-31 DIAGNOSIS — N1831 Chronic kidney disease, stage 3a: Secondary | ICD-10-CM

## 2020-10-31 DIAGNOSIS — E1169 Type 2 diabetes mellitus with other specified complication: Secondary | ICD-10-CM | POA: Diagnosis not present

## 2020-10-31 DIAGNOSIS — R200 Anesthesia of skin: Secondary | ICD-10-CM

## 2020-10-31 DIAGNOSIS — R202 Paresthesia of skin: Secondary | ICD-10-CM | POA: Diagnosis not present

## 2020-10-31 DIAGNOSIS — M791 Myalgia, unspecified site: Secondary | ICD-10-CM | POA: Diagnosis not present

## 2020-10-31 DIAGNOSIS — I10 Essential (primary) hypertension: Secondary | ICD-10-CM

## 2020-10-31 DIAGNOSIS — E785 Hyperlipidemia, unspecified: Secondary | ICD-10-CM | POA: Diagnosis not present

## 2020-10-31 LAB — POCT URINALYSIS DIPSTICK
Bilirubin, UA: NEGATIVE
Blood, UA: NEGATIVE
Glucose, UA: POSITIVE — AB
Ketones, UA: NEGATIVE
Leukocytes, UA: NEGATIVE
Nitrite, UA: NEGATIVE
Protein, UA: NEGATIVE
Spec Grav, UA: 1.01 (ref 1.010–1.025)
Urobilinogen, UA: 0.2 E.U./dL
pH, UA: 6.5 (ref 5.0–8.0)

## 2020-10-31 LAB — POCT UA - MICROALBUMIN: Microalbumin Ur, POC: 20 mg/L

## 2020-10-31 LAB — POCT GLYCOSYLATED HEMOGLOBIN (HGB A1C): Hemoglobin A1C: 8 % — AB (ref 4.0–5.6)

## 2020-10-31 MED ORDER — CYANOCOBALAMIN 1000 MCG/ML IJ SOLN
1000.0000 ug | Freq: Once | INTRAMUSCULAR | Status: AC
  Administered 2020-10-31: 1000 ug via INTRAMUSCULAR

## 2020-10-31 NOTE — Progress Notes (Signed)
Date:  10/31/2020   Name:  Sheila Sandoval   DOB:  1946/02/02   MRN:  [id number]   Chief Complaint: Urinary Frequency (X3 months, no other symptoms), Muscle Pain (X3 months, Both hands and legs, thumb on left hand tingling, feels like sand paper), and Medication Problem (Jardiance making pt dehydrated and skin B12 injection)  Diabetes Pertinent negatives for hypoglycemia include no headaches, nervousness/anxiousness or tremors. Associated symptoms include foot paresthesias and polyuria. Pertinent negatives for diabetes include no chest pain, no fatigue, no foot ulcerations and no polydipsia. Symptoms are worsening. Her weight is stable. She is following a generally healthy diet. When asked about meal planning, she reported none. An ACE inhibitor/angiotensin II receptor blocker is being taken. Eye exam is current.  Hypertension This is a chronic problem. The problem is controlled. Pertinent negatives include no chest pain, headaches, palpitations or shortness of breath. Past treatments include ACE inhibitors and calcium channel blockers. The current treatment provides significant improvement. There are no compliance problems.   Hyperlipidemia This is a chronic problem. The problem is controlled. Pertinent negatives include no chest pain, myalgias or shortness of breath. Current antihyperlipidemic treatment includes statins and ezetimibe. The current treatment provides significant (LDL 37) improvement of lipids.  Hand pain - fingers feel stiff in the AM and thumbs trigger.  She has minimal pain.  She does not want to see anyone yet if it is not dangerous.  Lab Results  Component Value Date   CREATININE 1.14 (H) 04/21/2020   BUN 20 04/21/2020   NA 138 04/21/2020   K 4.1 04/21/2020   CL 101 04/21/2020   CO2 23 04/21/2020   Lab Results  Component Value Date   CHOL 116 11/13/2019   HDL 52 11/13/2019   LDLCALC 37 11/13/2019   TRIG 167 (H) 11/13/2019   CHOLHDL 2.2 11/13/2019   Lab  Results  Component Value Date   TSH 1.420 04/21/2020   Lab Results  Component Value Date   HGBA1C 8.5 (H) 04/21/2020   Lab Results  Component Value Date   WBC 5.2 04/21/2020   HGB 12.8 04/21/2020   HCT 42.0 04/21/2020   MCV 74 (L) 04/21/2020   PLT 242 04/21/2020   Lab Results  Component Value Date   ALT 22 04/21/2020   AST 18 04/21/2020   ALKPHOS 86 04/21/2020   BILITOT <0.2 04/21/2020     Review of Systems  Constitutional: Negative for appetite change, fatigue, fever and unexpected weight change.  HENT: Negative for tinnitus and trouble swallowing.   Eyes: Negative for visual disturbance.  Respiratory: Negative for cough, chest tightness and shortness of breath.   Cardiovascular: Negative for chest pain, palpitations and leg swelling.  Gastrointestinal: Negative for abdominal pain, constipation and diarrhea.  Endocrine: Positive for polyuria. Negative for polydipsia.  Genitourinary: Negative for dysuria, hematuria and urgency.  Musculoskeletal: Positive for arthralgias (knee and thumbs). Negative for gait problem, joint swelling and myalgias.  Neurological: Positive for numbness (tingling in fingers intermittently). Negative for tremors and headaches.  Hematological: Negative for adenopathy.  Psychiatric/Behavioral: Negative for dysphoric mood. The patient is not nervous/anxious.     Patient Active Problem List   Diagnosis Date Noted  . Stage 3a chronic kidney disease ([place]) 04/21/2020  . Type II diabetes mellitus with complication ([place]) 28/36/6294  . Anatomical narrow angle glaucoma with borderline intraocular pressure 07/15/2017  . Vitamin D deficiency 03/11/2017  . Hypercalcemia 10/02/2016  . Pernicious anemia 04/11/2015  . Carpal tunnel syndrome 01/14/2015  .  Hyperlipidemia associated with type 2 diabetes mellitus ([place]) 01/14/2015  . Essential hypertension 01/14/2015  . Climacteric 01/14/2015  . Muscle fatigue 01/14/2015  . Arthritis of hand, degenerative  01/14/2015  . GERD (gastroesophageal reflux disease) 10/29/2011  . Spondylolisthesis 10/29/2011    Allergies  Allergen Reactions  . Atorvastatin     chest tightness  . Tramadol Hcl Nausea And Vomiting    Past Surgical History:  Procedure Laterality Date  . ABDOMINAL HYSTERECTOMY  1981  . APPENDECTOMY  1981  . REPAIR DURAL / CSF LEAK  2002    Social History   Tobacco Use  . Smoking status: Never Smoker  . Smokeless tobacco: Never Used  . Tobacco comment: smoking cessation material not required  Vaping Use  . Vaping Use: Never used  Substance Use Topics  . Alcohol use: No    Alcohol/week: 0.0 standard drinks  . Drug use: No     Medication list has been reviewed and updated.  Current Meds  Medication Sig  . ACCU-CHEK GUIDE test strip USE AS INSTRUCTED  . Accu-Chek Softclix Lancets lancets USE AS INSTRUCTED  . amLODipine (NORVASC) 10 MG tablet TAKE 1 TABLET EVERY DAY  . ammonium lactate (AMLACTIN) 12 % cream APPLY TOPICALLY AS NEEDED FOR DRY SKIN.  Marland Kitchen aspirin 81 MG tablet Take 1 tablet by mouth daily.  . blood glucose meter kit and supplies Dispense based on patient and insurance preference. Use up to four times daily as directed. (FOR ICD-10 E10.9, E11.9).  Marland Kitchen Blood Glucose Monitoring Suppl (ACCU-CHEK AVIVA PLUS) w/Device KIT USE TWICE DAILY  . Cholecalciferol 2000 units CAPS Take 1 capsule by mouth daily.  . diclofenac Sodium (VOLTAREN) 1 % GEL APPLY 2 GRAMS TOPICALLY 4 (FOUR) TIMES DAILY.  Marland Kitchen ezetimibe (ZETIA) 10 MG tablet Take 1 tablet (10 mg total) by mouth daily.  . Ferrous Gluconate 325 (36 FE) MG TABS Take by mouth.  . fluticasone (FLONASE) 50 MCG/ACT nasal spray USE 2 SPRAYS INTO BOTH NOSTRILS DAILY.  Marland Kitchen glimepiride (AMARYL) 2 MG tablet TAKE 1 TABLET EVERY DAY WITH BREAKFAST  . JANUMET 50-1000 MG tablet TAKE 1 TABLET TWICE DAILY WITH MEALS  . JARDIANCE 25 MG TABS tablet TAKE 1 TABLET EVERY DAY  . Lancet Devices (ACCU-CHEK SOFTCLIX) lancets 1 each by Other route 2  (two) times daily.  . nabumetone (RELAFEN) 750 MG tablet Take 1 tablet (750 mg total) by mouth at bedtime as needed.  . ramipril (ALTACE) 5 MG capsule TAKE 1 CAPSULE EVERY DAY  . rosuvastatin (CRESTOR) 5 MG tablet TAKE 1 TABLET EVERY DAY  . triamcinolone (KENALOG) 0.1 % APPLY TOPICALLY TWICE DAILY  . triamterene-hydrochlorothiazide (MAXZIDE-25) 37.5-25 MG tablet TAKE 1 TABLET EVERY DAY   Current Facility-Administered Medications for the 10/31/20 encounter (Office Visit) with Glean Hess, MD  Medication  . cyanocobalamin ((VITAMIN B-12)) injection 1,000 mcg    PHQ 2/9 Scores 10/31/2020 10/31/2020 10/08/2020 06/17/2020  PHQ - 2 Score 0 0 0 0  PHQ- 9 Score 0 0 - 0    GAD 7 : Generalized Anxiety Score 10/31/2020 10/31/2020 06/17/2020 11/13/2019  Nervous, Anxious, on Edge 0 0 0 0  Control/stop worrying 0 0 0 0  Worry too much - different things 0 0 0 0  Trouble relaxing 0 0 0 0  Restless 0 0 0 0  Easily annoyed or irritable 0 0 0 0  Afraid - awful might happen 0 0 0 0  Total GAD 7 Score 0 0 0 0  Anxiety Difficulty - - -  Not difficult at all    BP Readings from Last 3 Encounters:  10/31/20 106/74  06/17/20 122/76  04/21/20 106/64    Physical Exam Vitals and nursing note reviewed.  Constitutional:      General: She is not in acute distress.    Appearance: Normal appearance. She is well-developed.  HENT:     Head: Normocephalic and atraumatic.  Cardiovascular:     Rate and Rhythm: Normal rate and regular rhythm.     Pulses: Normal pulses.     Heart sounds: No murmur heard.   Pulmonary:     Effort: Pulmonary effort is normal. No respiratory distress.     Breath sounds: No wheezing or rhonchi.  Musculoskeletal:        General: No swelling, tenderness or deformity.     Cervical back: Normal range of motion.     Right lower leg: No edema.     Left lower leg: No edema.     Comments: Triggering of both thumbs No palmar thickening or tenderness  Lymphadenopathy:     Cervical: No  cervical adenopathy.  Skin:    General: Skin is warm and dry.     Capillary Refill: Capillary refill takes less than 2 seconds.     Findings: No rash.  Neurological:     General: No focal deficit present.     Mental Status: She is alert and oriented to person, place, and time.     Gait: Gait normal.  Psychiatric:        Mood and Affect: Mood normal.        Behavior: Behavior normal.     Wt Readings from Last 3 Encounters:  10/31/20 160 lb (72.6 kg)  06/17/20 156 lb (70.8 kg)  04/21/20 160 lb (72.6 kg)    BP 106/74   Pulse 63   Temp 98.1 F (36.7 C) (Oral)   Ht 5' 4"  (1.626 m)   Wt 160 lb (72.6 kg)   SpO2 98%   BMI 27.46 kg/m   Assessment and Plan: 1. Type II diabetes mellitus with complication (HCC) Clinically stable by exam and report without s/s of hypoglycemia. DM complicated by HTN, lipids. Tolerating medications well without side effects or other concerns.  She does have polyuria/dipsia due to [place]. - POCT HgB A1C - Comprehensive metabolic panel - TSH - POCT UA - Microalbumin  2. Essential hypertension Clinically stable exam with well controlled BP. Tolerating medications without side effects at this time. Pt to continue current regimen and low sodium diet; benefits of regular exercise as able discussed. - CBC with Differential/Platelet - POCT urinalysis dipstick  3. Stage 3a chronic kidney disease ([place]) Check labs Avoid taking NSAIDS regularly - Comprehensive metabolic panel  4. Myalgia Pt is really complaining of triggering which is mild Will consider Ortho referral if sx worsen  5. Numbness and tingling in both hands Need to resume monthly B12 injections Call if not improved  6. Hyperlipidemia associated with type 2 diabetes mellitus ([place]) Tolerating statin medication without side effects at this time LDL is at goal of < 70 on current dose Continue same therapy without change at this time. - Lipid panel  7. Pernicious anemia B12  injection today   Partially dictated using Editor, commissioning. Any errors are unintentional.  Halina Maidens, MD [facility]  10/31/2020

## 2020-11-01 LAB — COMPREHENSIVE METABOLIC PANEL
ALT: 20 IU/L (ref 0–32)
AST: 15 IU/L (ref 0–40)
Albumin/Globulin Ratio: 1.8 (ref 1.2–2.2)
Albumin: 4.7 g/dL (ref 3.7–4.7)
Alkaline Phosphatase: 88 IU/L (ref 44–121)
BUN/Creatinine Ratio: 19 (ref 12–28)
BUN: 20 mg/dL (ref 8–27)
Bilirubin Total: 0.4 mg/dL (ref 0.0–1.2)
CO2: 23 mmol/L (ref 20–29)
Calcium: 10.7 mg/dL — ABNORMAL HIGH (ref 8.7–10.3)
Chloride: 99 mmol/L (ref 96–106)
Creatinine, Ser: 1.06 mg/dL — ABNORMAL HIGH (ref 0.57–1.00)
Globulin, Total: 2.6 g/dL (ref 1.5–4.5)
Glucose: 173 mg/dL — ABNORMAL HIGH (ref 65–99)
Potassium: 4.4 mmol/L (ref 3.5–5.2)
Sodium: 139 mmol/L (ref 134–144)
Total Protein: 7.3 g/dL (ref 6.0–8.5)
eGFR: 55 mL/min/{1.73_m2} — ABNORMAL LOW (ref >59)

## 2020-11-01 LAB — CBC WITH DIFFERENTIAL/PLATELET
Basophils Absolute: 0 10*3/uL (ref 0.0–0.2)
Basos: 1 %
EOS (ABSOLUTE): 0.1 10*3/uL (ref 0.0–0.4)
Eos: 2 %
Hematocrit: 42.9 % (ref 34.0–46.6)
Hemoglobin: 13.3 g/dL (ref 11.1–15.9)
Immature Grans (Abs): 0 10*3/uL (ref 0.0–0.1)
Immature Granulocytes: 0 %
Lymphocytes Absolute: 2.1 10*3/uL (ref 0.7–3.1)
Lymphs: 37 %
MCH: 22.7 pg — ABNORMAL LOW (ref 26.6–33.0)
MCHC: 31 g/dL — ABNORMAL LOW (ref 31.5–35.7)
MCV: 73 fL — ABNORMAL LOW (ref 79–97)
Monocytes Absolute: 0.4 10*3/uL (ref 0.1–0.9)
Monocytes: 8 %
Neutrophils Absolute: 2.9 10*3/uL (ref 1.4–7.0)
Neutrophils: 52 %
Platelets: 258 10*3/uL (ref 150–450)
RBC: 5.85 x10E6/uL — ABNORMAL HIGH (ref 3.77–5.28)
RDW: 15.1 % (ref 11.7–15.4)
WBC: 5.6 10*3/uL (ref 3.4–10.8)

## 2020-11-01 LAB — LIPID PANEL
Chol/HDL Ratio: 2.4 ratio (ref 0.0–4.4)
Cholesterol, Total: 139 mg/dL (ref 100–199)
HDL: 57 mg/dL (ref >39)
LDL Chol Calc (NIH): 63 mg/dL (ref 0–99)
Triglycerides: 101 mg/dL (ref 0–149)
VLDL Cholesterol Cal: 19 mg/dL (ref 5–40)

## 2020-11-01 LAB — TSH: TSH: 1.38 u[IU]/mL (ref 0.450–4.500)

## 2020-11-17 ENCOUNTER — Other Ambulatory Visit: Payer: Self-pay | Admitting: Internal Medicine

## 2020-11-17 DIAGNOSIS — R21 Rash and other nonspecific skin eruption: Secondary | ICD-10-CM

## 2020-11-24 ENCOUNTER — Other Ambulatory Visit: Payer: Self-pay | Admitting: Internal Medicine

## 2020-11-24 NOTE — Telephone Encounter (Signed)
   Notes to clinic: Patient is schedule for appt tomorrow Medication is not due for refill yet    Requested Prescriptions  Pending Prescriptions Disp Refills   glimepiride (AMARYL) 2 MG tablet [Pharmacy Med Name: GLIMEPIRIDE 2 MG Tablet] 90 tablet 1    Sig: TAKE 1 TABLET EVERY DAY WITH BREAKFAST      Endocrinology:  Diabetes - Sulfonylureas Failed - 11/24/2020  1:23 PM      Failed - HBA1C is between 0 and 7.9 and within 180 days    Hemoglobin A1C  Date Value Ref Range Status  10/31/2020 8.0 (A) 4.0 - 5.6 % Final   Hgb A1c MFr Bld  Date Value Ref Range Status  04/21/2020 8.5 (H) 4.8 - 5.6 % Final    Comment:             Prediabetes: 5.7 - 6.4          Diabetes: >6.4          Glycemic control for adults with diabetes: <7.0           Passed - Valid encounter within last 6 months    Recent Outpatient Visits           3 weeks ago Type II diabetes mellitus with complication [facility])   [facility] Glean Hess, MD   5 months ago Type II diabetes mellitus with complication [facility])   [facility] Glean Hess, MD   7 months ago Essential hypertension   [facility] Glean Hess, MD   1 year ago Type II diabetes mellitus with complication [facility])   [facility] Glean Hess, MD   1 year ago Acute non-recurrent maxillary sinusitis   [facility] Glean Hess, MD       Future Appointments             Tomorrow Glean Hess, MD [facility], [place]   In 3 months [place] Melia Jesse Sans, MD [facility], [facility]

## 2020-11-25 ENCOUNTER — Encounter: Payer: Self-pay | Admitting: Internal Medicine

## 2020-11-25 ENCOUNTER — Other Ambulatory Visit: Payer: Self-pay

## 2020-11-25 ENCOUNTER — Ambulatory Visit (INDEPENDENT_AMBULATORY_CARE_PROVIDER_SITE_OTHER): Payer: Medicare PPO | Admitting: Internal Medicine

## 2020-11-25 VITALS — BP 132/78 | HR 78 | Ht 64.0 in | Wt 161.0 lb

## 2020-11-25 DIAGNOSIS — D485 Neoplasm of uncertain behavior of skin: Secondary | ICD-10-CM

## 2020-11-25 NOTE — Progress Notes (Signed)
Date:  11/25/2020   Name:  Sheila Sandoval   DOB:  1945/08/26   MRN:  [id number]   Chief Complaint: Ear Pain (Left Ear Pain on the outer ear. Has a lump on the upper outer ear that has been there for over 6 months. Its been painful in the last week.)  HPI  She has had a mass on the posterior left ear for some time.  Over the weekend it may have been abraded and it has become tender.  No bleeding but it may have drained some pus.  She has been applying TAO.  Lab Results  Component Value Date   CREATININE 1.06 (H) 10/31/2020   BUN 20 10/31/2020   NA 139 10/31/2020   K 4.4 10/31/2020   CL 99 10/31/2020   CO2 23 10/31/2020   Lab Results  Component Value Date   CHOL 139 10/31/2020   HDL 57 10/31/2020   LDLCALC 63 10/31/2020   TRIG 101 10/31/2020   CHOLHDL 2.4 10/31/2020   Lab Results  Component Value Date   TSH 1.380 10/31/2020   Lab Results  Component Value Date   HGBA1C 8.0 (A) 10/31/2020   Lab Results  Component Value Date   WBC 5.6 10/31/2020   HGB 13.3 10/31/2020   HCT 42.9 10/31/2020   MCV 73 (L) 10/31/2020   PLT 258 10/31/2020   Lab Results  Component Value Date   ALT 20 10/31/2020   AST 15 10/31/2020   ALKPHOS 88 10/31/2020   BILITOT 0.4 10/31/2020     Review of Systems  Constitutional: Negative for fever.  HENT: Negative for ear pain.   Respiratory: Negative for chest tightness and shortness of breath.   Cardiovascular: Negative for chest pain.  Skin:       Ear lesion    Patient Active Problem List   Diagnosis Date Noted  . Stage 3a chronic kidney disease ([place]) 04/21/2020  . Type II diabetes mellitus with complication ([place]) 09/38/1829  . Anatomical narrow angle glaucoma with borderline intraocular pressure 07/15/2017  . Vitamin D deficiency 03/11/2017  . Hypercalcemia 10/02/2016  . Pernicious anemia 04/11/2015  . Carpal tunnel syndrome 01/14/2015  . Hyperlipidemia associated with type 2 diabetes mellitus ([place]) 01/14/2015  . Essential  hypertension 01/14/2015  . Climacteric 01/14/2015  . Muscle fatigue 01/14/2015  . Arthritis of hand, degenerative 01/14/2015  . GERD (gastroesophageal reflux disease) 10/29/2011  . Spondylolisthesis 10/29/2011    Allergies  Allergen Reactions  . Atorvastatin     chest tightness  . Tramadol Hcl Nausea And Vomiting    Past Surgical History:  Procedure Laterality Date  . ABDOMINAL HYSTERECTOMY  1981  . APPENDECTOMY  1981  . REPAIR DURAL / CSF LEAK  2002    Social History   Tobacco Use  . Smoking status: Never Smoker  . Smokeless tobacco: Never Used  . Tobacco comment: smoking cessation material not required  Vaping Use  . Vaping Use: Never used  Substance Use Topics  . Alcohol use: No    Alcohol/week: 0.0 standard drinks  . Drug use: No     Medication list has been reviewed and updated.  Current Meds  Medication Sig  . ACCU-CHEK GUIDE test strip USE AS INSTRUCTED  . Accu-Chek Softclix Lancets lancets USE AS INSTRUCTED  . amLODipine (NORVASC) 10 MG tablet TAKE 1 TABLET EVERY DAY  . ammonium lactate (AMLACTIN) 12 % cream APPLY TOPICALLY AS NEEDED FOR DRY SKIN.  Marland Kitchen aspirin 81 MG tablet Take 1  tablet by mouth daily.  . blood glucose meter kit and supplies Dispense based on patient and insurance preference. Use up to four times daily as directed. (FOR ICD-10 E10.9, E11.9).  Marland Kitchen Blood Glucose Monitoring Suppl (ACCU-CHEK AVIVA PLUS) w/Device KIT USE TWICE DAILY  . Cholecalciferol 2000 units CAPS Take 1 capsule by mouth daily.  . diclofenac Sodium (VOLTAREN) 1 % GEL APPLY 2 GRAMS TOPICALLY 4 (FOUR) TIMES DAILY.  Marland Kitchen ezetimibe (ZETIA) 10 MG tablet Take 1 tablet (10 mg total) by mouth daily.  . Ferrous Gluconate 325 (36 FE) MG TABS Take by mouth.  . fluticasone (FLONASE) 50 MCG/ACT nasal spray USE 2 SPRAYS INTO BOTH NOSTRILS DAILY.  Marland Kitchen glimepiride (AMARYL) 2 MG tablet TAKE 1 TABLET EVERY DAY WITH BREAKFAST  . JANUMET 50-1000 MG tablet TAKE 1 TABLET TWICE DAILY WITH MEALS  .  JARDIANCE 25 MG TABS tablet TAKE 1 TABLET EVERY DAY  . Lancet Devices (ACCU-CHEK SOFTCLIX) lancets 1 each by Other route 2 (two) times daily.  . nabumetone (RELAFEN) 750 MG tablet Take 1 tablet (750 mg total) by mouth at bedtime as needed.  . ramipril (ALTACE) 5 MG capsule TAKE 1 CAPSULE EVERY DAY  . rosuvastatin (CRESTOR) 5 MG tablet TAKE 1 TABLET EVERY DAY  . triamcinolone cream (KENALOG) 0.1 % APPLY TOPICALLY TWICE DAILY  . triamterene-hydrochlorothiazide (MAXZIDE-25) 37.5-25 MG tablet TAKE 1 TABLET EVERY DAY   Current Facility-Administered Medications for the 11/25/20 encounter (Office Visit) with Glean Hess, MD  Medication  . cyanocobalamin ((VITAMIN B-12)) injection 1,000 mcg    PHQ 2/9 Scores 11/25/2020 10/31/2020 10/31/2020 10/08/2020  PHQ - 2 Score 1 0 0 0  PHQ- 9 Score 2 0 0 -    GAD 7 : Generalized Anxiety Score 11/25/2020 10/31/2020 10/31/2020 06/17/2020  Nervous, Anxious, on Edge 0 0 0 0  Control/stop worrying 0 0 0 0  Worry too much - different things 0 0 0 0  Trouble relaxing 0 0 0 0  Restless 0 0 0 0  Easily annoyed or irritable 0 0 0 0  Afraid - awful might happen 1 0 0 0  Total GAD 7 Score 1 0 0 0  Anxiety Difficulty Not difficult at all - - -    BP Readings from Last 3 Encounters:  11/25/20 132/78  10/31/20 106/74  06/17/20 122/76    Physical Exam Vitals and nursing note reviewed.  Constitutional:      General: She is not in acute distress.    Appearance: She is well-developed.  HENT:     Head: Normocephalic and atraumatic.   Pulmonary:     Effort: Pulmonary effort is normal. No respiratory distress.  Skin:    General: Skin is warm and dry.     Findings: No rash.  Neurological:     Mental Status: She is alert and oriented to person, place, and time.  Psychiatric:        Mood and Affect: Mood normal.        Behavior: Behavior normal.     Wt Readings from Last 3 Encounters:  11/25/20 161 lb (73 kg)  10/31/20 160 lb (72.6 kg)  06/17/20 156 lb (70.8  kg)    BP 132/78   Pulse 78   Ht _0  (1.626 m)   Wt 161 lb (73 kg)   SpO2 98%   BMI 27.64 kg/m   Assessment and Plan: 1. Neoplasm of uncertain behavior of skin - Ambulatory referral to Dermatology   Partially dictated using Dragon  software. Any errors are unintentional.  Halina Maidens, MD [facility]  11/25/2020

## 2020-12-02 ENCOUNTER — Other Ambulatory Visit: Payer: Self-pay | Admitting: Internal Medicine

## 2020-12-02 NOTE — Telephone Encounter (Signed)
Requested Prescriptions  Pending Prescriptions Disp Refills  . ezetimibe (ZETIA) 10 MG tablet [Pharmacy Med Name: EZETIMIBE 10 MG Tablet] 90 tablet 0    Sig: TAKE 1 TABLET EVERY DAY     Cardiovascular:  Antilipid - Sterol Transport Inhibitors Passed - 12/02/2020  7:35 PM      Passed - Total Cholesterol in normal range and within 360 days    Cholesterol, Total  Date Value Ref Range Status  10/31/2020 139 100 - 199 mg/dL Final         Passed - LDL in normal range and within 360 days    LDL Chol Calc (NIH)  Date Value Ref Range Status  10/31/2020 63 0 - 99 mg/dL Final         Passed - HDL in normal range and within 360 days    HDL  Date Value Ref Range Status  10/31/2020 57 >39 mg/dL Final         Passed - Triglycerides in normal range and within 360 days    Triglycerides  Date Value Ref Range Status  10/31/2020 101 0 - 149 mg/dL Final         Passed - Valid encounter within last 12 months    Recent Outpatient Visits          1 week ago Neoplasm of uncertain behavior of skin   [facility] Glean Hess, MD   1 month ago Type II diabetes mellitus with complication [facility])   [facility] Glean Hess, MD   5 months ago Type II diabetes mellitus with complication [facility])   [facility] Glean Hess, MD   7 months ago Essential hypertension   [facility] Glean Hess, MD   1 year ago Type II diabetes mellitus with complication [facility])   [facility] Glean Hess, MD      Future Appointments            In 3 months Army Melia Jesse Sans, MD [facility], [facility]

## 2020-12-10 ENCOUNTER — Ambulatory Visit: Payer: Medicare PPO

## 2020-12-18 ENCOUNTER — Encounter: Payer: Medicare PPO | Admitting: Internal Medicine

## 2020-12-18 DIAGNOSIS — L821 Other seborrheic keratosis: Secondary | ICD-10-CM | POA: Diagnosis not present

## 2020-12-18 DIAGNOSIS — L918 Other hypertrophic disorders of the skin: Secondary | ICD-10-CM | POA: Diagnosis not present

## 2020-12-18 DIAGNOSIS — Z1283 Encounter for screening for malignant neoplasm of skin: Secondary | ICD-10-CM | POA: Diagnosis not present

## 2020-12-18 DIAGNOSIS — L905 Scar conditions and fibrosis of skin: Secondary | ICD-10-CM | POA: Diagnosis not present

## 2020-12-18 DIAGNOSIS — D485 Neoplasm of uncertain behavior of skin: Secondary | ICD-10-CM | POA: Diagnosis not present

## 2020-12-18 DIAGNOSIS — D225 Melanocytic nevi of trunk: Secondary | ICD-10-CM | POA: Diagnosis not present

## 2020-12-18 DIAGNOSIS — D1801 Hemangioma of skin and subcutaneous tissue: Secondary | ICD-10-CM | POA: Diagnosis not present

## 2020-12-18 DIAGNOSIS — D210 Benign neoplasm of connective and other soft tissue of head, face and neck: Secondary | ICD-10-CM | POA: Diagnosis not present

## 2020-12-18 DIAGNOSIS — L812 Freckles: Secondary | ICD-10-CM | POA: Diagnosis not present

## 2020-12-19 ENCOUNTER — Encounter: Payer: Medicare PPO | Admitting: Internal Medicine

## 2020-12-23 ENCOUNTER — Telehealth: Payer: Self-pay | Admitting: Internal Medicine

## 2020-12-23 NOTE — Telephone Encounter (Signed)
Pt does not want to make an appt unless md request. Pt seen dr berglund on 11-25-2020 and forgot to mention she has been having low back pain, hands cramping, body aches and muscle pain. [facility] [address]  [place]

## 2020-12-23 NOTE — Telephone Encounter (Signed)
Please call pt to schedule an appt we can not prescribe any medication without seeing pt for these problems.  KP

## 2020-12-23 NOTE — Telephone Encounter (Signed)
Patient will call back to set up appointment.  

## 2021-01-05 ENCOUNTER — Ambulatory Visit (INDEPENDENT_AMBULATORY_CARE_PROVIDER_SITE_OTHER): Payer: Medicare PPO

## 2021-01-05 DIAGNOSIS — Z599 Problem related to housing and economic circumstances, unspecified: Secondary | ICD-10-CM

## 2021-01-05 DIAGNOSIS — Z0000 Encounter for general adult medical examination without abnormal findings: Secondary | ICD-10-CM

## 2021-01-05 NOTE — Progress Notes (Signed)
Subjective:   Sheila Sandoval is a 75 y.o. female who presents for Medicare Annual (Subsequent) preventive examination.  Virtual Visit via Telephone Note  I connected with  Sheila Sandoval on 01/05/21 at  8:40 AM EDT by telephone and verified that I am speaking with the correct person using two identifiers.  Location: Patient: home Provider: [facility] Persons participating in the virtual visit: [name]   I discussed the limitations, risks, security and privacy concerns of performing an evaluation and management service by telephone and the availability of in person appointments. The patient expressed understanding and agreed to proceed.  Interactive audio and video telecommunications were attempted between this nurse and patient, however failed, due to patient having technical difficulties OR patient did not have access to video capability.  We continued and completed visit with audio only.  Some vital signs may be absent or patient reported.   Clemetine Marker, LPN   Review of Systems     Cardiac Risk Factors include: advanced age (>45mn, >>92women);diabetes mellitus;dyslipidemia;hypertension     Objective:    Today's Vitals   01/05/21 0846  PainSc: 7    There is no height or weight on file to calculate BMI.  Advanced Directives 01/05/2021 10/08/2020 12/10/2019 12/10/2019 03/12/2019 12/06/2018 01/02/2018  Does Patient Have a Medical Advance Directive? _0  No No  Would patient like information on creating a medical advance directive? Yes (MAU/Ambulatory/Procedural Areas - Information given) No - Patient declined - Yes (MAU/Ambulatory/Procedural Areas - Information given) No - Patient declined Yes (MAU/Ambulatory/Procedural Areas - Information given) Yes (MAU/Ambulatory/Procedural Areas - Information given)    Current Medications (verified) Outpatient Encounter Medications as of 01/05/2021  Medication Sig   ACCU-CHEK GUIDE test strip USE AS  INSTRUCTED   Accu-Chek Softclix Lancets lancets USE AS INSTRUCTED   amLODipine (NORVASC) 10 MG tablet TAKE 1 TABLET EVERY DAY   ammonium lactate (AMLACTIN) 12 % cream APPLY TOPICALLY AS NEEDED FOR DRY SKIN.   aspirin 81 MG tablet Take 1 tablet by mouth daily.   blood glucose meter kit and supplies Dispense based on patient and insurance preference. Use up to four times daily as directed. (FOR ICD-10 E10.9, E11.9).   Cholecalciferol 2000 units CAPS Take 1 capsule by mouth daily.   diclofenac Sodium (VOLTAREN) 1 % GEL APPLY 2 GRAMS TOPICALLY 4 (FOUR) TIMES DAILY.   ezetimibe (ZETIA) 10 MG tablet TAKE 1 TABLET EVERY DAY   Ferrous Gluconate 325 (36 FE) MG TABS Take by mouth.   fluticasone (FLONASE) 50 MCG/ACT nasal spray USE 2 SPRAYS INTO BOTH NOSTRILS DAILY.   glimepiride (AMARYL) 2 MG tablet TAKE 1 TABLET EVERY DAY WITH BREAKFAST   JANUMET 50-1000 MG tablet TAKE 1 TABLET TWICE DAILY WITH MEALS   JARDIANCE 25 MG TABS tablet TAKE 1 TABLET EVERY DAY   nabumetone (RELAFEN) 750 MG tablet Take 1 tablet (750 mg total) by mouth at bedtime as needed.   ramipril (ALTACE) 5 MG capsule TAKE 1 CAPSULE EVERY DAY   rosuvastatin (CRESTOR) 5 MG tablet TAKE 1 TABLET EVERY DAY   triamcinolone cream (KENALOG) 0.1 % APPLY TOPICALLY TWICE DAILY   triamterene-hydrochlorothiazide (MAXZIDE-25) 37.5-25 MG tablet TAKE 1 TABLET EVERY DAY   [DISCONTINUED] Blood Glucose Monitoring Suppl (ACCU-CHEK AVIVA PLUS) w/Device KIT USE TWICE DAILY   [DISCONTINUED] Lancet Devices (ACCU-CHEK SOFTCLIX) lancets 1 each by Other route 2 (two) times daily.   Facility-Administered Encounter Medications as of 01/05/2021  Medication   cyanocobalamin ((VITAMIN B-12)) injection 1,000  mcg    Allergies (verified) Atorvastatin and Tramadol hcl   History: Past Medical History:  Diagnosis Date   Arthritis    B12 deficiency    Diabetes mellitus without complication (HCC)    GERD (gastroesophageal reflux disease)    Hypercalcemia     Hyperlipidemia    Hypertension    Vitamin D deficiency    Past Surgical History:  Procedure Laterality Date   [place] / CSF LEAK  2002   Family History  Problem Relation Age of Onset   Diabetes Mother    Cancer Father        prostate   Breast cancer Sister    Cancer Brother        prostate   Other Sister        fall   Social History   Socioeconomic History   Marital status: Widowed    Spouse name: Not on file   Number of children: 3   Years of education: Not on file   Highest education level: 11th grade  Occupational History   Occupation: Retired  Tobacco Use   Smoking status: Never   Smokeless tobacco: Never   Tobacco comments:    smoking cessation material not required  Vaping Use   Vaping Use: Never used  Substance and Sexual Activity   Alcohol use: No    Alcohol/week: 0.0 standard drinks   Drug use: No   Sexual activity: Not Currently  Other Topics Concern   Not on file  Social History Narrative   Pt lives alone.    Social Determinants of Health   Financial Resource Strain: Medium Risk   Difficulty of Paying Living Expenses: Somewhat hard  Food Insecurity: No Food Insecurity   Worried About Charity fundraiser in the Last Year: Never true   Ran Out of Food in the Last Year: Never true  Transportation Needs: No Transportation Needs   Lack of Transportation (Medical): No   Lack of Transportation (Non-Medical): No  Physical Activity: Insufficiently Active   Days of Exercise per Week: 3 days   Minutes of Exercise per Session: 20 min  Stress: No Stress Concern Present   Feeling of Stress : Not at all  Social Connections: Moderately Integrated   Frequency of Communication with Friends and Family: More than three times a week   Frequency of Social Gatherings with Friends and Family: More than three times a week   Attends Religious Services: More than 4 times per year   Active Member of [employer] or  Organizations: Yes   Attends Archivist Meetings: More than 4 times per year   Marital Status: Widowed    Tobacco Counseling Counseling given: Not Answered Tobacco comments: smoking cessation material not required   Clinical Intake:  Pre-visit preparation completed: Yes  Pain : 0-10 Pain Score: 7  Pain Type: Chronic pain Pain Location: Hand Pain Orientation: Right, Left Pain Descriptors / Indicators: Aching, Cramping Pain Onset: More than a month ago Pain Frequency: Constant     Nutritional Risks: None Diabetes: Yes CBG done?: No Did pt. bring in CBG monitor from home?: No  How often do you need to have someone help you when you read instructions, pamphlets, or other written materials from your doctor or pharmacy?: 1 - Never  Nutrition Risk Assessment:  Has the patient had any N/V/D within the last 2 months?  No  Does the patient have any non-healing wounds?  No  Has the patient had any unintentional weight loss or weight gain?  No   Diabetes:  Is the patient diabetic?  Yes  If diabetic, was a CBG obtained today?  No  Did the patient bring in their glucometer from home?  No  How often do you monitor your CBG's? Occasionally per patient.   Financial Strains and Diabetes Management:  Are you having any financial strains with the device, your supplies or your medication? No .  Does the patient want to be seen by Chronic Care Management for management of their diabetes?  No  Would the patient like to be referred to a Nutritionist or for Diabetic Management?  No   Diabetic Exams:  Diabetic Eye Exam: Completed 09/26/20 negative retinopathy.   Diabetic Foot Exam: Completed 10/31/20.   Interpreter Needed?: No  Information entered by :: Clemetine Marker LPN   Activities of Daily Living In your present state of health, do you have any difficulty performing the following activities: 01/05/2021 11/25/2020  Hearing? N N  Comment declines hearing aids -  Vision? N N   Difficulty concentrating or making decisions? N N  Walking or climbing stairs? N N  Dressing or bathing? N N  Doing errands, shopping? N N  Preparing Food and eating ? N -  Using the Toilet? N -  In the past six months, have you accidently leaked urine? N -  Do you have problems with loss of bowel control? N -  Managing your Medications? N -  Managing your Finances? N -  Housekeeping or managing your Housekeeping? N -  Some recent data might be hidden    Patient Care Team: Glean Hess, MD as PCP - General (Internal Medicine) Sherie Don, MD as Referring Physician (Ophthalmology) Dr Johnathan Hausen (Dermatology) Florance, Tomasa Blase, RN as Sm[facility] Management Clenton Pare, MD as Referring Physician (Orthopedic Surgery)  Indicate any recent Medical Services you may have received from other than [facility] providers in the past year (date may be approximate).     Assessment:   This is a routine wellness examination for [place].  Hearing/Vision screen Hearing Screening - Comments:: Pt denies hearing difficulty Vision Screening - Comments:: Annual vision screenings. Dr. Brigitte Pulse and [place] [facility]  Dietary issues and exercise activities discussed: Current Exercise Habits: Home exercise routine, Type of exercise: calisthenics, Time (Minutes): 20, Frequency (Times/Week): 3, Weekly Exercise (Minutes/Week): 60, Intensity: Mild, Exercise limited by: orthopedic condition(s)   Goals Addressed             This Visit's Progress    DIET - INCREASE WATER INTAKE   On track    Recommend to drink at least 6-8 8oz glasses of water per day.        Depression Screen PHQ 2/9 Scores 01/05/2021 11/25/2020 10/31/2020 10/31/2020 10/08/2020 06/17/2020 12/10/2019  PHQ - 2 Score 0 1 0 0 0 0 0  PHQ- 9 Score 0 2 0 0 - 0 -    Fall Risk Fall Risk  01/05/2021 11/25/2020 10/31/2020 10/31/2020 06/17/2020  Falls in the past year? 0 0 0 0 0  Comment - - - - -  Number falls in past  yr: 0 0 - - -  Injury with Fall? 0 0 - - -  Comment - - - - -  Risk Factor Category  - - - - -  Comment - - - - -  Risk for fall due to : No Fall Risks - - - -  Risk for fall due to: Comment - - - - -  Follow up Falls prevention discussed Falls evaluation completed Falls evaluation completed Falls evaluation completed Falls evaluation completed    [place]:  Any stairs in or around the home? Yes  If so, are there any without handrails? No  Home free of loose throw rugs in walkways, pet beds, electrical cords, etc? Yes  Adequate lighting in your home to reduce risk of falls? Yes   ASSISTIVE DEVICES UTILIZED TO PREVENT FALLS:  Life alert? Yes  Use of a cane, walker or w/c? No  Grab bars in the bathroom? Yes  Shower chair or bench in shower? Yes  Elevated toilet seat or a handicapped toilet? Yes   TIMED UP AND GO:  Was the test performed? No . Telephonic visit.   Cognitive Function: Normal cognitive status assessed by direct observation by this Nurse Health Advisor. No abnormalities found.       6CIT Screen 12/10/2019 12/06/2018 11/30/2017 11/05/2016  What Year? 0 points 0 points 0 points 0 points  What month? 0 points 0 points 0 points 0 points  What time? 0 points 0 points 0 points 0 points  Count back from 20 0 points 0 points 0 points 0 points  Months in reverse 0 points 0 points 4 points 0 points  Repeat phrase 2 points 2 points 4 points 0 points  Total Score _0 0    Immunizations Immunization History  Administered Date(s) Administered   Fluad Quad(high Dose 65+) 05/01/2019, 04/21/2020   Influenza, High Dose Seasonal PF 04/07/2018   Influenza,inj,Quad PF,6+ Mos 07/25/2013, 04/11/2015, 06/04/2016, 08/09/2016, 05/04/2017   Influenza,inj,quad, With Preservative 03/27/2019   Influenza-Unspecified 08/20/2014   Moderna Sars-Covid-2 Vaccination 08/30/2019, 09/28/2019, 05/19/2020, 12/03/2020   Pneumococcal Conjugate-13 08/02/2014    Pneumococcal Polysaccharide-23 09/04/2010, 09/17/2016   Pneumococcal-Unspecified 07/26/2000, 09/04/2010, 03/27/2019   Tdap 09/04/2010   Zoster Recombinat (Shingrix) 10/13/2017, 01/05/2018    TDAP status: Due, Education has been provided regarding the importance of this vaccine. Advised may receive this vaccine at local pharmacy or Health Dept. Aware to provide a copy of the vaccination record if obtained from local pharmacy or Health Dept. Verbalized acceptance and understanding.  Flu Vaccine status: Up to date  Pneumococcal vaccine status: Up to date  Covid-19 vaccine status: Completed vaccines  Qualifies for Shingles Vaccine? Yes   Zostavax completed Yes   Shingrix Completed?: Yes  Screening Tests Health Maintenance  Topic Date Due   TETANUS/TDAP  10/31/2021 (Originally 09/04/2020)   INFLUENZA VACCINE  02/23/2021   COVID-19 Vaccine (5 - Booster for Moderna series) 04/05/2021   HEMOGLOBIN A1C  05/02/2021   OPHTHALMOLOGY EXAM  09/26/2021   MAMMOGRAM  10/30/2021   FOOT EXAM  10/31/2021   COLONOSCOPY (Pts 45-48yr Insurance coverage will need to be confirmed)  09/08/2025   DEXA SCAN  Completed   Hepatitis C Screening  Completed   PNA vac Low Risk Adult  Completed   Zoster Vaccines- Shingrix  Completed   HPV VACCINES  Aged Out    Health Maintenance  There are no preventive care reminders to display for this patient.   Colorectal cancer screening: Type of screening: Colonoscopy. Completed 09/09/15. Repeat every 10 years  Mammogram status: Completed 10/30/20. Repeat every year  Bone Density status: Completed 05/27/17. Results reflect: Bone density results: NORMAL. Repeat every 3-5 years.  Lung Cancer Screening: (Low Dose CT Chest recommended if Age 75-80years, 30 pack-year currently smoking OR have  quit w/in 15years.) does not qualify.   Additional Screening:  Hepatitis C Screening: does qualify; Completed 11/05/16  Vision Screening: Recommended annual ophthalmology exams  for early detection of glaucoma and other disorders of the eye. Is the patient up to date with their annual eye exam?  Yes  Who is the provider or what is the name of the office in which the patient attends annual eye exams? Dr. Brigitte Pulse [facility] [place].   Dental Screening: Recommended annual dental exams for proper oral hygiene  Community Resource Referral / Chronic Care Management: CRR required this visit?  Yes  - utility assistance and information to apply for Medicaid  CCM required this visit?  No      Plan:     I have personally reviewed and noted the following in the patient's chart:   Medical and social history Use of alcohol, tobacco or illicit drugs  Current medications and supplements including opioid prescriptions.  Functional ability and status Nutritional status Physical activity Advanced directives List of other physicians Hospitalizations, surgeries, and ER visits in previous 12 months Vitals Screenings to include cognitive, depression, and falls Referrals and appointments  In addition, I have reviewed and discussed with patient certain preventive protocols, quality metrics, and best practice recommendations. A written personalized care plan for preventive services as well as general preventive health recommendations were provided to patient.     Clemetine Marker, LPN   9/61/1643   Nurse Notes: handout mailed to patient for healthy eating & follow up scheduled with [facility] outreach for 01/08/21

## 2021-01-05 NOTE — Patient Instructions (Signed)
[name] , Thank you for taking time to come for your Medicare Wellness Visit. I appreciate your ongoing commitment to your health goals. Please review the following plan we discussed and let me know if I can assist you in the future.   Screening recommendations/referrals: Colonoscopy: done 09/09/15. Repeat in 2027 Mammogram: done 10/30/20. Due 10/2021 Bone Density: done 05/27/17. Due 05/2022 Recommended yearly ophthalmology/optometry visit for glaucoma screening and checkup Recommended yearly dental visit for hygiene and checkup  Vaccinations: Influenza vaccine: done 04/21/20 Pneumococcal vaccine: done 03/27/19 Tdap vaccine: done 09/04/10. Due  Shingles vaccine: done 10/13/17, 01/05/18   Covid-19:done 08/30/19, 09/28/19, 05/19/20 & 12/03/20  Advanced directives: Advance directive discussed with you today. I have provided a copy for you to complete at home and have notarized. Once this is complete please bring a copy in to our office so we can scan it into your chart.   Conditions/risks identified: Recommend A1c goal of <7% with healthy eating and physical activity   Next appointment: Follow up in one year for your annual wellness visit    Preventive Care 65 Years and Older, Female Preventive care refers to lifestyle choices and visits with your health care provider that can promote health and wellness. What does preventive care include? A yearly physical exam. This is also called an annual well check. Dental exams once or twice a year. Routine eye exams. Ask your health care provider how often you should have your eyes checked. Personal lifestyle choices, including: Daily care of your teeth and gums. Regular physical activity. Eating a healthy diet. Avoiding tobacco and drug use. Limiting alcohol use. Practicing safe sex. Taking low-dose aspirin every day. Taking vitamin and mineral supplements as recommended by your health care provider. What happens during an annual well check? The  services and screenings done by your health care provider during your annual well check will depend on your age, overall health, lifestyle risk factors, and family history of disease. Counseling  Your health care provider may ask you questions about your: Alcohol use. Tobacco use. Drug use. Emotional well-being. Home and relationship well-being. Sexual activity. Eating habits. History of falls. Memory and ability to understand (cognition). Work and work Statistician. Reproductive health. Screening  You may have the following tests or measurements: Height, weight, and BMI. Blood pressure. Lipid and cholesterol levels. These may be checked every 5 years, or more frequently if you are over 67 years old. Skin check. Lung cancer screening. You may have this screening every year starting at age 42 if you have a 30-pack-year history of smoking and currently smoke or have quit within the past 15 years. Fecal occult blood test (FOBT) of the stool. You may have this test every year starting at age 45. Flexible sigmoidoscopy or colonoscopy. You may have a sigmoidoscopy every 5 years or a colonoscopy every 10 years starting at age 44. Hepatitis C blood test. Hepatitis B blood test. Sexually transmitted disease (STD) testing. Diabetes screening. This is done by checking your blood sugar (glucose) after you have not eaten for a while (fasting). You may have this done every 1-3 years. Bone density scan. This is done to screen for osteoporosis. You may have this done starting at age 25. Mammogram. This may be done every 1-2 years. Talk to your health care provider about how often you should have regular mammograms. Talk with your health care provider about your test results, treatment options, and if necessary, the need for more tests. Vaccines  Your health care provider may  recommend certain vaccines, such as: Influenza vaccine. This is recommended every year. Tetanus, diphtheria, and acellular  pertussis (Tdap, Td) vaccine. You may need a Td booster every 10 years. Zoster vaccine. You may need this after age 7. Pneumococcal 13-valent conjugate (PCV13) vaccine. One dose is recommended after age 89. Pneumococcal polysaccharide (PPSV23) vaccine. One dose is recommended after age 39. Talk to your health care provider about which screenings and vaccines you need and how often you need them. This information is not intended to replace advice given to you by your health care provider. Make sure you discuss any questions you have with your health care provider. Document Released: 08/08/2015 Document Revised: 03/31/2016 Document Reviewed: 05/13/2015 Elsevier Interactive Patient Education  2017 [place] Prevention in the Home Falls can cause injuries. They can happen to people of all ages. There are many things you can do to make your home safe and to help prevent falls. What can I do on the outside of my home? Regularly fix the edges of walkways and driveways and fix any cracks. Remove anything that might make you trip as you walk through a door, such as a raised step or threshold. Trim any bushes or trees on the path to your home. Use bright outdoor lighting. Clear any walking paths of anything that might make someone trip, such as rocks or tools. Regularly check to see if handrails are loose or broken. Make sure that both sides of any steps have handrails. Any raised decks and porches should have guardrails on the edges. Have any leaves, snow, or ice cleared regularly. Use sand or salt on walking paths during winter. Clean up any spills in your garage right away. This includes oil or grease spills. What can I do in the bathroom? Use night lights. Install grab bars by the toilet and in the tub and shower. Do not use towel bars as grab bars. Use non-skid mats or decals in the tub or shower. If you need to sit down in the shower, use a plastic, non-slip stool. Keep the floor  dry. Clean up any water that spills on the floor as soon as it happens. Remove soap buildup in the tub or shower regularly. Attach bath mats securely with double-sided non-slip rug tape. Do not have throw rugs and other things on the floor that can make you trip. What can I do in the bedroom? Use night lights. Make sure that you have a light by your bed that is easy to reach. Do not use any sheets or blankets that are too big for your bed. They should not hang down onto the floor. Have a firm chair that has side arms. You can use this for support while you get dressed. Do not have throw rugs and other things on the floor that can make you trip. What can I do in the kitchen? Clean up any spills right away. Avoid walking on wet floors. Keep items that you use a lot in easy-to-reach places. If you need to reach something above you, use a strong step stool that has a grab bar. Keep electrical cords out of the way. Do not use floor polish or wax that makes floors slippery. If you must use wax, use non-skid floor wax. Do not have throw rugs and other things on the floor that can make you trip. What can I do with my stairs? Do not leave any items on the stairs. Make sure that there are handrails on both sides of  the stairs and use them. Fix handrails that are broken or loose. Make sure that handrails are as long as the stairways. Check any carpeting to make sure that it is firmly attached to the stairs. Fix any carpet that is loose or worn. Avoid having throw rugs at the top or bottom of the stairs. If you do have throw rugs, attach them to the floor with carpet tape. Make sure that you have a light switch at the top of the stairs and the bottom of the stairs. If you do not have them, ask someone to add them for you. What else can I do to help prevent falls? Wear shoes that: Do not have high heels. Have rubber bottoms. Are comfortable and fit you well. Are closed at the toe. Do not wear  sandals. If you use a stepladder: Make sure that it is fully opened. Do not climb a closed stepladder. Make sure that both sides of the stepladder are locked into place. Ask someone to hold it for you, if possible. Clearly mark and make sure that you can see: Any grab bars or handrails. First and last steps. Where the edge of each step is. Use tools that help you move around (mobility aids) if they are needed. These include: Canes. Walkers. Scooters. Crutches. Turn on the lights when you go into a dark area. Replace any light bulbs as soon as they burn out. Set up your furniture so you have a clear path. Avoid moving your furniture around. If any of your floors are uneven, fix them. If there are any pets around you, be aware of where they are. Review your medicines with your doctor. Some medicines can make you feel dizzy. This can increase your chance of falling. Ask your doctor what other things that you can do to help prevent falls. This information is not intended to replace advice given to you by your health care provider. Make sure you discuss any questions you have with your health care provider. Document Released: 05/08/2009 Document Revised: 12/18/2015 Document Reviewed: 08/16/2014 Elsevier Interactive Patient Education  2017 [employer].

## 2021-01-07 ENCOUNTER — Telehealth: Payer: Self-pay | Admitting: Internal Medicine

## 2021-01-07 NOTE — Telephone Encounter (Signed)
   Telephone encounter was:  Successful.  01/07/2021 Name: CHASTITY NOLAND MRN: [id number] DOB: 11/03/1945  HANAN MCWILLIAMS is a 75 y.o. year old female who is a primary care patient of Army Melia Jesse Sans, MD . The community resource team was consulted for assistance with Food Insecurity and Financial Difficulties related to paying her utility bills  Care guide performed the following interventions: Patient provided with information about care guide support team and interviewed to confirm resource needs Discussed resources to assist with finding food resources and utility bill assistance. Pt currently lives [place] from [place] close to [facility]. Pt said that she will call Medicaid and see if they can apply for her over the phone because she can't do an application online and already receives food stamps. Since she is already in their system it will be faster for her to call and speak to a case manager. She owns her home and makes a little over $1200 a month, there is a good possibility that she will not qualify for Medicaid. I explained that I will have to do some research in her area to help her find resources to help with food insecurity and utility bill help .  Follow Up Plan:  Care guide will follow up with patient by phone over the next week.  [place], Care Management Phone: [phone] Email: [email or web address]

## 2021-01-08 ENCOUNTER — Other Ambulatory Visit: Payer: Self-pay

## 2021-01-08 NOTE — Patient Outreach (Signed)
[facility]) Care Management  01/08/2021  Sheila Sandoval 1946-02-19 [id number]   Telephone Assessment    Outreach attempt to patient. Spoke with patient who reported she was still in the bed.    Plan: RN CM will make outreach attempt to patient within the month of Aug if no return call from patient.    [name], RN,BSN,CCM [place] Management Telephonic Care Management Coordinator Direct Phone: [phone] Toll Free: [phone] Fax: [phone]

## 2021-01-27 ENCOUNTER — Telehealth: Payer: Self-pay | Admitting: Internal Medicine

## 2021-01-27 NOTE — Telephone Encounter (Signed)
   Telephone encounter was:  Successful.  01/27/2021 Name: KENDEL PESNELL MRN: [id number] DOB: 29-Mar-1946  CELESTINE PRIM is a 75 y.o. year old female who is a primary care patient of Army Melia Jesse Sans, MD . The community resource team was consulted for assistance with Financial Difficulties related to paying her bills  Care guide performed the following interventions: Follow up call placed to the patient to discuss status of referral Let pt know that at this time CG could not find any resources in her area. Pt understood and said that she did call DSS to see if she qualifies for Medicaid and she is till waiting on the outcome .  Follow Up Plan:  No further follow up planned at this time. The patient has been provided with needed resources.  [place], Care Management Phone: [phone] Email: [email or web address]

## 2021-02-18 ENCOUNTER — Other Ambulatory Visit: Payer: Self-pay | Admitting: Internal Medicine

## 2021-02-19 NOTE — Telephone Encounter (Signed)
Requested Prescriptions  Pending Prescriptions Disp Refills  . Accu-Chek Softclix Lancets lancets [Pharmacy Med Name: ACCU-CHEK SOFTCLIX LANCETS] 300 each 0    Sig: USE AS DIRECTED     Endocrinology: Diabetes - Testing Supplies Passed - 02/18/2021  3:21 PM      Passed - Valid encounter within last 12 months    Recent Outpatient Visits          2 months ago Neoplasm of uncertain behavior of skin   [facility] Glean Hess, MD   3 months ago Type II diabetes mellitus with complication [facility])   [facility] Glean [facility], [facility]   8 months ago Type II diabetes mellitus with complication [facility])   [facility] Gle[facility], [facility]   10 months ago Essential hypertension   [facility] Glean Hess, MD   1 year ago Type II diabetes mellitus with complication [facility])   [facility] Glean [facility], [facility]      Future Appointments            In 2 weeks Glean Hess, MD [facility]           . amLODipine ([place]) 10 MG tablet [Pharmacy Med Name: AMLODIPINE BESYLATE 10 MG Tablet] 90 tablet 0    Sig: TAKE 1 TABLET EVERY DAY     Cardiovascular:  Calcium Channel Blockers Passed - 02/18/2021  3:21 PM      Passed - Last BP in normal range    BP Readings from Last 1 Encounters:  11/25/20 132/78         Passed - Valid encounter within last 6 months    Recent Outpatient Visits          2 months ago Neoplasm of uncertain behavior of skin   [facility], MD   3 months ago Type II diabetes mellitus with complication [facility])   [facility]an [facility], [facility]   8 months ago Type II diabetes mellitus with complication [facility])   [facility] Glean Hess, [facility]   10 months ago Essential hypertension   [facility] Glean Hess, MD   1 year ago Type II diabetes mellitus with complication [facility])   [facility] Glean [facility], [facility]      Future  Appointments            In 2 weeks Glean Hess, MD [facility]           . ramipril (ALTACE) 5 MG capsule [Pharmacy Med Name: RAMIPRIL 5 MG Capsule] 90 capsule 0    Sig: TAKE 1 CAPSULE EVERY DAY     Cardiovascular:  ACE Inhibitors Failed - 02/18/2021  3:21 PM      Failed - Cr in normal range and within 180 days    Creatinine, Ser  Date Value Ref Range Status  10/31/2020 1.06 (H) 0.57 - 1.00 mg/dL Final         Passed - K in normal range and within 180 days    Potassium  Date Value Ref Range Status  10/31/2020 4.4 3.5 - 5.2 mmol/L Final         Passed - Patient is not pregnant      Passed - Last BP in normal range    BP Readings from Last 1 Encounters:  11/25/20 132/78         Passed - Valid  encounter within last 6 months    Recent Outpatient Visits          2 months ago Neoplasm of uncertain behavior of skin   [facility]   3 months ago Type II diabetes mellitus with complication [facility])   [facility] Glean Hess, MD   8 months ago Type II diabetes mellitus with complication [facility])   [facility] Glean Hess, MD   10 months ago Essential hypertension   [facility] Glean Hess, MD   1 year ago Type II diabetes mellitus with complication [facility])   [facility] Glean Hess, MD      Future Appointments            In 2 weeks Glean Hess, MD [facility], [facility]           . triamterene-hydrochlorothiazide ([id number]) 37.5-25 MG tablet [Pharmacy Med Name: TRIAMTERENE/HYDROCHLOROTHIAZIDE 37.5-25 MG Tablet] 90 tablet 0    Sig: TAKE 1 TABLET EVERY DAY     Cardiovascular: Diuretic Combos Failed - 02/18/2021  3:21 PM      Failed - Cr in normal range and within 360 days    Creatinine, Ser  Date Value Ref Range Status  10/31/2020 1.06 (H) 0.57 - 1.00 mg/dL Final         Failed - Ca in normal range and within 360 days    Calcium  Date Value Ref  Range Status  10/31/2020 10.7 (H) 8.7 - 10.3 mg/dL Final         Passed - K in normal range and within 360 days    Potassium  Date Value Ref Range Status  10/31/2020 4.4 3.5 - 5.2 mmol/L Final         Passed - Na in normal range and within 360 days    Sodium  Date Value Ref Range Status  10/31/2020 139 134 - 144 mmol/L Final         Passed - Last BP in normal range    BP Readings from Last 1 Encounters:  11/25/20 132/78         Passed - Valid encounter within last 6 months    Recent Outpatient Visits          2 months ago Neoplasm of uncertain behavior of skin   [facility] Glean [facility], MD   3 months ago Type II diabetes mellitus with complication [facility])   [facility] Glean Hess, MD   8 months ago Type II diabetes mellitus with complication [facility])   [facility] Glean Hess, [facility]   10 months ago Essential hypertension   [facility] Glean Hess, MD   1 year ago Type II diabetes mellitus with complication [facility])   [facility] Glean Hess, MD      Future Appointments            In 2 weeks Army Melia Jesse Sans, MD [facility] [place]           . JARDIANCE 25 MG TABS tablet [Pharmacy Med Name: JARDIANCE 25 MG Tablet] 90 tablet 0    Sig: TAKE 1 TABLET EVERY DAY     Endocrinology:  Diabetes - SGLT2 Inhibitors Failed - 02/18/2021  3:21 PM      Failed - Cr in normal range and within 360 days    Creatinine, Ser  Date Value Ref Range Status  10/31/2020 1.06 (H) 0.57 - 1.00 mg/dL Final         Failed - HBA1C is between 0 and 7.9 and within 180 days    Hemoglobin A1C  Date Value Ref Range Status  10/31/2020 8.0 (A) 4.0 - 5.6 % Final   Hgb A1c MFr Bld  Date Value Ref Range Status  04/21/2020 8.5 (H) 4.8 - 5.6 % Final    Comment:             Prediabetes: 5.7 - 6.4          Diabetes: >6.4          Glycemic control for adults with diabetes: <7.0          Failed - AA eGFR in normal range  and within 360 days    GFR calc Af Amer  Date Value Ref Range Status  04/21/2020 55 (L) >59 mL/min/1.73 Final    Comment:    **[facility] currently reports eGFR in compliance with the current**   recommendations of the [employer]. [facility] will   update reporting as new guidelines are published from the NKF-ASN   Task force.    GFR calc non Af Amer  Date Value Ref Range Status  04/21/2020 47 (L) >59 mL/min/1.73 Final   eGFR  Date Value Ref Range Status  10/31/2020 55 (L) >59 mL/min/1.73 Final         Passed - LDL in normal range and within 360 days    LDL Chol Calc (NIH)  Date Value Ref Range Status  10/31/2020 63 0 - 99 mg/dL Final         Passed - Valid encounter within last 6 months    Recent Outpatient Visits          2 months ago Neoplasm of uncertain behavior of skin   [facility], [facility]   3 months ago Type II diabetes mellitus with complication [facility])   [facility] Glean Hess, MD   8 months ago Type II diabetes mellitus with complication [facility])   [facility] Glean Hess, MD   10 months ago Essential hypertension   [facility], MD   1 year ago Type II diabetes mellitus with complication [facility])   [facility] Glean Hess, MD      Future Appointments            In 2 weeks Army Melia Jesse Sans, MD [facility], [facility]

## 2021-03-02 ENCOUNTER — Other Ambulatory Visit: Payer: Self-pay | Admitting: Internal Medicine

## 2021-03-10 ENCOUNTER — Other Ambulatory Visit: Payer: Self-pay

## 2021-03-10 NOTE — Patient Outreach (Signed)
[facility]) Care Management  03/10/2021  Sheila Sandoval 1946-07-05 [id number]   Telephone Assessment Quarterly Call  Successful outreach call placed to patient. She is just waking up but reports she is doing well. No new issues or concerns at present. Patient reports she had PCP appt for today but called and rescheduled due to the weather and she did not feel comfortable driving in it. She denies any RN CM needs or concerns at this time.     Medications Reviewed Today     Reviewed by Hayden Pedro, RN (Registered Nurse) on 03/10/21 at 1045  Med List Status: <None>   Medication Order Taking? Sig Documenting Provider Last Dose Status Informant  ACCU-CHEK GUIDE test strip [id number]  USE AS INSTRUCTED Glean Hess, MD  Active   Accu-Chek Softclix Lancets lancets [id number]  USE AS DIRECTED Glean Hess, MD  Active   amLODipine (NORVASC) 10 MG tablet [id number]  TAKE 1 TABLET EVERY DAY Glean Hess, MD  Active   ammonium lactate (AMLACTIN) 12 % cream [id number] No APPLY TOPICALLY AS NEEDED FOR DRY SKIN. Glean Hess, MD Taking Active   aspirin 81 MG tablet [id number] No Take 1 tablet by mouth daily. [provider] Taking Active            Med Note Romana Juniper Dec 10, 2019 11:31 AM)    blood glucose meter kit and supplies [id number] No Dispense based on patient and insurance preference. Use up to four times daily as directed. (FOR ICD-10 E10.9, E11.9). Glean Hess, MD Taking Active   Cholecalciferol 2000 units CAPS [id number] No Take 1 capsule by mouth daily. [provider] Taking Active   cyanocobalamin ((VITAMIN B-12)) injection 1,000 mcg [id number]   Juline Patch, MD  Active   diclofenac Sodium (VOLTAREN) 1 % GEL [id number] No APPLY 2 GRAMS TOPICALLY 4 (FOUR) TIMES DAILY. Glean Hess, MD Taking Active   ezetimibe (ZETIA) 10 MG tablet [id number] No TAKE 1 TABLET EVERY DAY Glean Hess, MD  Taking Active   Ferrous Gluconate 325 (36 FE) MG TABS [id number] No Take by mouth. [provider] Taking Active            Med Note Winfield Cunas, ELISABETH A   Wed Sep 07, 2017 11:13 AM) Taking 1 tablet by mouth once daily  fluticasone (FLONASE) 50 MCG/ACT nasal spray [id number] No USE 2 SPRAYS INTO BOTH NOSTRILS DAILY. Glean Hess, MD Taking Active   glimepiride (AMARYL) 2 MG tablet [id number] No TAKE 1 TABLET EVERY DAY WITH BREAKFAST Glean Hess, MD Taking Active   JANUMET 50-1000 MG tablet [id number] No TAKE 1 TABLET TWICE DAILY WITH MEALS Glean Hess, MD Taking Active   JARDIANCE 25 MG TABS tablet [id number]  TAKE 1 TABLET EVERY DAY Glean Hess, MD  Active   nabumetone (RELAFEN) 750 MG tablet [id number] No Take 1 tablet (750 mg total) by mouth at bedtime as needed. Glean Hess, MD Taking Active   ramipril (ALTACE) 5 MG capsule [id number]  TAKE 1 CAPSULE EVERY DAY Glean Hess, MD  Active   rosuvastatin (CRESTOR) 5 MG tablet [id number] No TAKE 1 TABLET EVERY DAY Glean Hess, MD Taking Active   triamcinolone cream (KENALOG) 0.1 % [id number] No APPLY TOPICALLY TWICE DAILY Glean Hess, MD Taking Active   triamterene-hydrochlorothiazide [facility]) 37.5-25 MG tablet [id number]  TAKE 1 TABLET EVERY DAY Glean Hess, MD  Active  Goals Addressed               This Visit's Progress     (THN)Set My Target A1C (pt-stated)        Timeframe:  Long-Range Goal Priority:  High Start Date:  04/29/20                           Expected End Date:  Dec 2022                Follow Up Date Nov 2022    Barriers: Health Behaviors Knowledge   - set target A1C Patient will discuss frequency and A1C goal level with PCP at next appt    Why is this important?   Your target A1C is decided together by you and your doctor.  It is based on several things like your age and other health issues.    Notes: 10/08/2020-Patient due for PCP appt  and labwork. Last A1C on file 8.5(Sept 2021). She reports she is monitoring cbgs in the home about 3-4x/day. Blood sugars ranging in the 130s-150s per pt report.   03/10/2021-Patient last A1C on file 8.0(April 2022). She rescheduled PCP appt that ws for today for next month due to weather.       (THN)Track and Manage My Blood Pressure-Hypertension (pt-stated)        Timeframe:  Long-Range Goal Priority:  High Start Date:   10/08/2020                          Expected End Date:    June 2022                   Follow Up Date June 2022  Barriers: Health Behaviors Knowledge    - check blood pressure daily - write blood pressure results in a log or diary    Why is this important?   You won't feel high blood pressure, but it can still hurt your blood vessels.  High blood pressure can cause heart or kidney problems. It can also cause a stroke.  Making lifestyle changes like losing a little weight or eating less salt will help.  Checking your blood pressure at home and at different times of the day can help to control blood pressure.  If the doctor prescribes medicine remember to take it the way the doctor ordered.  Call the office if you cannot afford the medicine or if there are questions about it.     Notes:  10/08/2020-Patient admits that she has not been consistently checking BP in the home although she has machine. She was encouraged to do so. She voices med adherence to BP meds.  03/10/2021-Patient reports BP readings have been within the 120s-130s. She is trying to adhere to low salt diet. She voices med adherence to current regimen.          Plan:  RN CM discussed with patient next outreach within the month of Nov . Patient agrees to care plan and follow up. Patient gave verbal consent and in agreement with RN CM follow up and timeframe. Patient aware that they may contact RN CM sooner for any issues or concerns. RN CM reviewed goals and plan of care with patient. RN CM will send  quarterly update to PCP.  [name], RN,BSN,CCM [facility] Telephonic Care Management Coordinator Direct Phone: [phone] Toll Free: [phone] Fax: [phone]

## 2021-03-11 ENCOUNTER — Encounter: Payer: Medicare PPO | Admitting: Internal Medicine

## 2021-03-20 ENCOUNTER — Telehealth: Payer: Self-pay

## 2021-03-20 NOTE — Telephone Encounter (Signed)
Called pt left VM to call back. Pt needs an appt for DM f/u.   PEC nurse may give results to patient if they return call to clinic, a CRM has been created.  KP

## 2021-03-20 NOTE — Telephone Encounter (Signed)
-----   Message from Glean Hess, MD sent at 03/20/2021  2:03 PM EDT ----- Regarding: DM follow up She is over due for DM follow up.  Please schedule in the next three weeks.

## 2021-04-07 ENCOUNTER — Other Ambulatory Visit: Payer: Self-pay

## 2021-04-07 ENCOUNTER — Ambulatory Visit (INDEPENDENT_AMBULATORY_CARE_PROVIDER_SITE_OTHER): Payer: Medicare PPO | Admitting: Internal Medicine

## 2021-04-07 ENCOUNTER — Encounter: Payer: Self-pay | Admitting: Internal Medicine

## 2021-04-07 VITALS — BP 122/72 | HR 99 | Temp 98.5°F | Ht 64.0 in | Wt 159.0 lb

## 2021-04-07 DIAGNOSIS — M48061 Spinal stenosis, lumbar region without neurogenic claudication: Secondary | ICD-10-CM

## 2021-04-07 DIAGNOSIS — E118 Type 2 diabetes mellitus with unspecified complications: Secondary | ICD-10-CM | POA: Diagnosis not present

## 2021-04-07 DIAGNOSIS — M720 Palmar fascial fibromatosis [Dupuytren]: Secondary | ICD-10-CM | POA: Diagnosis not present

## 2021-04-07 DIAGNOSIS — Z23 Encounter for immunization: Secondary | ICD-10-CM

## 2021-04-07 DIAGNOSIS — E538 Deficiency of other specified B group vitamins: Secondary | ICD-10-CM | POA: Diagnosis not present

## 2021-04-07 LAB — POCT GLYCOSYLATED HEMOGLOBIN (HGB A1C): Hemoglobin A1C: 8.5 % — AB (ref 4.0–5.6)

## 2021-04-07 NOTE — Progress Notes (Signed)
Date:  04/07/2021   Name:  Sheila Sandoval   DOB:  22-Apr-1946   MRN:  [id number]   Chief Complaint: Back Pain (For years getting worse, lower back ), Fatigue (Feeling more tired than usual, wants b12 injection), Joint Pain (X 5-6 months Fingers and knees cramp all the time), and Flu Vaccine  Back Pain This is a chronic problem. The problem has been gradually worsening since onset. The pain is present in the lumbar spine. The quality of the pain is described as aching. The pain radiates to the left foot. The pain is moderate. The pain is Worse during the night. The symptoms are aggravated by lying down. Pertinent negatives include no abdominal pain, bladder incontinence, bowel incontinence, chest pain, dysuria, fever, headaches or numbness. She has tried analgesics for the symptoms. The treatment provided mild relief.  Diabetes She presents for her follow-up diabetic visit. She has type 2 diabetes mellitus. Her disease course has been stable. Pertinent negatives for hypoglycemia include no headaches or tremors. Pertinent negatives for diabetes include no chest pain, no fatigue, no polydipsia and no polyuria. Current diabetic treatments: jardiance, janumet and glipizide. She is compliant with treatment all of the time.  Hand Pain  There was no injury mechanism. The pain is present in the left hand. The quality of the pain is described as cramping. The pain is mild. The pain has been Fluctuating since the incident. Pertinent negatives include no chest pain or numbness.   Lab Results  Component Value Date   CREATININE 1.06 (H) 10/31/2020   BUN 20 10/31/2020   NA 139 10/31/2020   K 4.4 10/31/2020   CL 99 10/31/2020   CO2 23 10/31/2020   Lab Results  Component Value Date   CHOL 139 10/31/2020   HDL 57 10/31/2020   LDLCALC 63 10/31/2020   TRIG 101 10/31/2020   CHOLHDL 2.4 10/31/2020   Lab Results  Component Value Date   TSH 1.380 10/31/2020   Lab Results  Component Value Date    HGBA1C 8.0 (A) 10/31/2020   Lab Results  Component Value Date   WBC 5.6 10/31/2020   HGB 13.3 10/31/2020   HCT 42.9 10/31/2020   MCV 73 (L) 10/31/2020   PLT 258 10/31/2020   Lab Results  Component Value Date   ALT 20 10/31/2020   AST 15 10/31/2020   ALKPHOS 88 10/31/2020   BILITOT 0.4 10/31/2020     Review of Systems  Constitutional:  Negative for appetite change, fatigue, fever and unexpected weight change.  HENT:  Negative for tinnitus and trouble swallowing.   Eyes:  Negative for visual disturbance.  Respiratory:  Negative for cough, chest tightness and shortness of breath.   Cardiovascular:  Negative for chest pain, palpitations and leg swelling.  Gastrointestinal:  Negative for abdominal pain and bowel incontinence.  Endocrine: Negative for polydipsia and polyuria.  Genitourinary:  Negative for bladder incontinence, dysuria and hematuria.  Musculoskeletal:  Positive for back pain. Negative for arthralgias.  Neurological:  Negative for tremors, numbness and headaches.  Psychiatric/Behavioral:  Negative for dysphoric mood.    Patient Active Problem List   Diagnosis Date Noted   Stage 3a chronic kidney disease ([place]) 04/21/2020   Type II diabetes mellitus with complication ([place]) 86[birth year]76/1950   Anatomical narrow angle glaucoma with borderline intraocular pressure 07/15/2017   Vitamin D deficiency 03/11/2017   Hypercalcemia 10/02/2016   Pernicious anemia 04/11/2015   Carpal tunnel syndrome 01/14/2015   Hyperlipidemia associated with type 2 diabetes  mellitus ([place]) 01/14/2015   Essential hypertension 01/14/2015   Climacteric 01/14/2015   Muscle fatigue 01/14/2015   Arthritis of hand, degenerative 01/14/2015   GERD (gastroesophageal reflux disease) 10/29/2011   Spondylolisthesis 10/29/2011    Allergies  Allergen Reactions   Atorvastatin     chest tightness   Tramadol Hcl Nausea And Vomiting    Past Surgical History:  Procedure Laterality Date   [place] / CSF LEAK  2002    Social History   Tobacco Use   Smoking status: Never   Smokeless tobacco: Never   Tobacco comments:    smoking cessation material not required  Vaping Use   Vaping Use: Never used  Substance Use Topics   Alcohol use: No    Alcohol/week: 0.0 standard drinks   Drug use: No     Medication list has been reviewed and updated.  Current Meds  Medication Sig   ACCU-CHEK GUIDE test strip USE AS INSTRUCTED   Accu-Chek Softclix Lancets lancets USE AS DIRECTED   amLODipine (NORVASC) 10 MG tablet TAKE 1 TABLET EVERY DAY   ammonium lactate (AMLACTIN) 12 % cream APPLY TOPICALLY AS NEEDED FOR DRY SKIN.   aspirin 81 MG tablet Take 1 tablet by mouth daily.   blood glucose meter kit and supplies Dispense based on patient and insurance preference. Use up to four times daily as directed. (FOR ICD-10 E10.9, E11.9).   Cholecalciferol 2000 units CAPS Take 1 capsule by mouth daily.   diclofenac Sodium (VOLTAREN) 1 % GEL APPLY 2 GRAMS TOPICALLY 4 (FOUR) TIMES DAILY.   ezetimibe (ZETIA) 10 MG tablet TAKE 1 TABLET EVERY DAY   Ferrous Gluconate 325 (36 FE) MG TABS Take by mouth.   fluticasone (FLONASE) 50 MCG/ACT nasal spray USE 2 SPRAYS INTO BOTH NOSTRILS DAILY.   glimepiride (AMARYL) 2 MG tablet TAKE 1 TABLET EVERY DAY WITH BREAKFAST   JANUMET 50-1000 MG tablet TAKE 1 TABLET TWICE DAILY WITH MEALS   JARDIANCE 25 MG TABS tablet TAKE 1 TABLET EVERY DAY   nabumetone (RELAFEN) 750 MG tablet Take 1 tablet (750 mg total) by mouth at bedtime as needed.   ramipril (ALTACE) 5 MG capsule TAKE 1 CAPSULE EVERY DAY   rosuvastatin (CRESTOR) 5 MG tablet TAKE 1 TABLET EVERY DAY   triamcinolone cream (KENALOG) 0.1 % APPLY TOPICALLY TWICE DAILY   triamterene-hydrochlorothiazide (MAXZIDE-25) 37.5-25 MG tablet TAKE 1 TABLET EVERY DAY   Current Facility-Administered Medications for the 04/07/21 encounter (Office Visit) with Glean Hess, MD   Medication   cyanocobalamin ((VITAMIN B-12)) injection 1,000 mcg    PHQ 2/9 Scores 04/07/2021 01/05/2021 11/25/2020 10/31/2020  PHQ - 2 Score 0 0 1 0  PHQ- 9 Score 0 0 2 0    GAD 7 : Generalized Anxiety Score 04/07/2021 11/25/2020 10/31/2020 10/31/2020  Nervous, Anxious, on Edge 0 0 0 0  Control/stop worrying 0 0 0 0  Worry too much - different things 0 0 0 0  Trouble relaxing 0 0 0 0  Restless 0 0 0 0  Easily annoyed or irritable 0 0 0 0  Afraid - awful might happen 0 1 0 0  Total GAD 7 Score 0 1 0 0  Anxiety Difficulty - Not difficult at all - -    BP Readings from Last 3 Encounters:  04/07/21 122/72  11/25/20 132/78  10/31/20 106/74    Physical Exam Vitals and nursing note reviewed.  Constitutional:  General: She is not in acute distress.    Appearance: She is well-developed.  HENT:     Head: Normocephalic and atraumatic.  Cardiovascular:     Rate and Rhythm: Normal rate and regular rhythm.     Pulses: Normal pulses.     Heart sounds: No murmur heard. Pulmonary:     Effort: Pulmonary effort is normal. No respiratory distress.     Breath sounds: No wheezing or rhonchi.  Abdominal:     General: Abdomen is flat. There is no distension.     Palpations: Abdomen is soft. There is no mass.     Tenderness: There is abdominal tenderness. There is no guarding or rebound.     Hernia: No hernia is present.  Musculoskeletal:        General: Normal range of motion.     Cervical back: Normal range of motion.     Lumbar back: No tenderness or bony tenderness. Normal range of motion. Negative right straight leg raise test and negative left straight leg raise test.     Right lower leg: No edema.     Left lower leg: No edema.     Comments: Palmar thickening of left 4th finger  Lymphadenopathy:     Cervical: No cervical adenopathy.  Skin:    General: Skin is warm and dry.     Capillary Refill: Capillary refill takes less than 2 seconds.     Findings: No rash.  Neurological:      General: No focal deficit present.     Mental Status: She is alert and oriented to person, place, and time.  Psychiatric:        Mood and Affect: Mood normal.        Behavior: Behavior normal.    Wt Readings from Last 3 Encounters:  04/07/21 159 lb (72.1 kg)  11/25/20 161 lb (73 kg)  10/31/20 160 lb (72.6 kg)    BP 122/72   Pulse 99   Temp 98.5 F (36.9 C) (Oral)   Ht _0  (1.626 m)   Wt 159 lb (72.1 kg)   SpO2 98%   BMI 27.29 kg/m   Assessment and Plan: 1. Type II diabetes mellitus with complication (HCC) BS control is not as good a previously.  She will work harder on her diet. Continue same medications for now.  Will need to consider adding Ozempic next visit. - POCT glycosylated hemoglobin (Hb A1C) = 8.5  2. Spinal stenosis of lumbar region without neurogenic claudication Symptoms are intermittent; she does not want to see Ortho yet.  3. Dupuytren's contracture of left hand Recommend hand specialists. - Ambulatory referral to Orthopedic Surgery   Partially dictated using Dragon software. Any errors are unintentional.  Halina Maidens, MD [facility]  04/07/2021

## 2021-04-07 NOTE — Patient Instructions (Signed)
[facility], [facility] [place], [place] [address] Phone: [phone]

## 2021-04-15 ENCOUNTER — Ambulatory Visit: Payer: Medicare PPO | Admitting: Internal Medicine

## 2021-04-20 ENCOUNTER — Other Ambulatory Visit: Payer: Self-pay

## 2021-04-20 MED ORDER — EZETIMIBE 10 MG PO TABS: 10.0000 mg | ORAL_TABLET | Freq: Every day | ORAL | 0 refills | Status: DC

## 2021-04-24 ENCOUNTER — Other Ambulatory Visit: Payer: Self-pay | Admitting: Internal Medicine

## 2021-04-24 DIAGNOSIS — R21 Rash and other nonspecific skin eruption: Secondary | ICD-10-CM

## 2021-04-29 ENCOUNTER — Telehealth: Payer: Self-pay

## 2021-04-29 NOTE — Telephone Encounter (Signed)
Spoke to pt about the kidney disease diagnosis on her chart let her know it was due to her GFR being out of range. Let her know that we will continue to monitor it. Pt verbalized understanding.  KP

## 2021-04-29 NOTE — Telephone Encounter (Signed)
Copied from [place] [id number]. Topic: General - Call Back - No Documentation >> Apr 29, 2021  1:25 PM Erick Blinks wrote: Reason for CRM: Pt is requesting to speak to a nurse regarding her chart  Best contact: [phone]

## 2021-04-30 DIAGNOSIS — H2513 Age-related nuclear cataract, bilateral: Secondary | ICD-10-CM | POA: Diagnosis not present

## 2021-04-30 DIAGNOSIS — E119 Type 2 diabetes mellitus without complications: Secondary | ICD-10-CM | POA: Diagnosis not present

## 2021-04-30 DIAGNOSIS — H18513 Endothelial corneal dystrophy, bilateral: Secondary | ICD-10-CM | POA: Diagnosis not present

## 2021-04-30 DIAGNOSIS — H527 Unspecified disorder of refraction: Secondary | ICD-10-CM | POA: Diagnosis not present

## 2021-04-30 DIAGNOSIS — H04123 Dry eye syndrome of bilateral lacrimal glands: Secondary | ICD-10-CM | POA: Diagnosis not present

## 2021-05-04 ENCOUNTER — Other Ambulatory Visit: Payer: Self-pay | Admitting: Internal Medicine

## 2021-05-04 DIAGNOSIS — E782 Mixed hyperlipidemia: Secondary | ICD-10-CM

## 2021-05-11 DIAGNOSIS — H25812 Combined forms of age-related cataract, left eye: Secondary | ICD-10-CM | POA: Diagnosis not present

## 2021-05-11 DIAGNOSIS — Z7984 Long term (current) use of oral hypoglycemic drugs: Secondary | ICD-10-CM | POA: Diagnosis not present

## 2021-05-11 DIAGNOSIS — H02839 Dermatochalasis of unspecified eye, unspecified eyelid: Secondary | ICD-10-CM | POA: Diagnosis not present

## 2021-05-11 DIAGNOSIS — H2512 Age-related nuclear cataract, left eye: Secondary | ICD-10-CM | POA: Diagnosis not present

## 2021-05-11 DIAGNOSIS — E1136 Type 2 diabetes mellitus with diabetic cataract: Secondary | ICD-10-CM | POA: Diagnosis not present

## 2021-05-11 DIAGNOSIS — Z7982 Long term (current) use of aspirin: Secondary | ICD-10-CM | POA: Diagnosis not present

## 2021-05-11 DIAGNOSIS — H527 Unspecified disorder of refraction: Secondary | ICD-10-CM | POA: Diagnosis not present

## 2021-05-11 DIAGNOSIS — H04129 Dry eye syndrome of unspecified lacrimal gland: Secondary | ICD-10-CM | POA: Diagnosis not present

## 2021-05-11 DIAGNOSIS — H18519 Endothelial corneal dystrophy, unspecified eye: Secondary | ICD-10-CM | POA: Diagnosis not present

## 2021-05-11 DIAGNOSIS — H2513 Age-related nuclear cataract, bilateral: Secondary | ICD-10-CM | POA: Diagnosis not present

## 2021-05-19 NOTE — Telephone Encounter (Signed)
Pt states she has another follow up question for you concerning the issue from last week.  Would like a call back please

## 2021-05-19 NOTE — Telephone Encounter (Signed)
Called pt and left VM asking her to contact office with more details for what she is requesting.

## 2021-06-01 ENCOUNTER — Other Ambulatory Visit: Payer: Self-pay

## 2021-06-01 NOTE — Patient Outreach (Signed)
[facility]) Care Management  06/01/2021  Sheila Sandoval [birth year] [id number]   Telephone Assessment  Successful outreach call to patient. She voices that she has ben doing well and denies any acute issues or concerns. She continues to reside in her home and remains independent with ADLs/IADLs. Denies any recent falls. Appetite remains good and WNL for her. Wgt stable. She denies any RN CM needs or concerns at this time.    Care Plan : RN Care Manager POC  Updates made by Hayden Pedro, RN since 06/01/2021 12:00 AM     Problem: Chronic Disease Mgmt of Chronic Condition(DM,HTN)   Priority: High     Long-Range Goal: Development of POC for Mgmt of Chronic Conditions(DM,HTN)   Start Date: 06/01/2021  Expected End Date: 06/01/2022  Priority: High  Note:    Current Barriers:  Chronic Disease Management support and education needs related to HTN and DMII  RNCM Clinical Goal(s):  Patient will verbalize basic understanding of  HTN and DMII disease process and self health management plan for chronic conditions take all medications exactly as prescribed and will call provider for medication related questions demonstrate Improved adherence to prescribed treatment plan for HTN and DMII as evidenced by lowering of A1C level continue to work with RN Care Manager to address care management and care coordination needs related to  HTN and DMII through collaboration with RN Care manager, provider, and care team.   Interventions: POC sent to PCP upon initial assessment, quarterly and with any changes in patient's conditions Inter-disciplinary care team collaboration (see longitudinal plan of care) Evaluation of current treatment plan related to  self management and patient's adherence to plan as established by provider  Hypertension Interventions: Last practice recorded BP readings:  BP Readings from Last 3 Encounters:  04/07/21 122/72  11/25/20 132/78   10/31/20 106/74  Most recent eGFR/CrCl:  Lab Results  Component Value Date   EGFR 55 (L) 10/31/2020    No components found for: CRCL  Evaluation of current treatment plan related to hypertension self management and patient's adherence to plan as established by provider; Reviewed medications with patient and discussed importance of compliance; Advised patient, providing education and rationale, to monitor blood pressure daily and record, calling PCP for findings outside established parameters;  Reviewed scheduled/upcoming provider appointments including:   Diabetes Interventions: Assessed patient's understanding of A1c goal: <7%-Patient's A1C level has slightly increased from 8.0 (April 2022 to 8.5-Sept 2022). She reports cbgs have been ranging in the 120-170s pre and post prandial.  Provided education to patient about basic DM disease process; Reviewed medications with patient and discussed importance of medication adherence; Discussed plans with patient for ongoing care management follow up and provided patient with direct contact information for care management team; Reviewed scheduled/upcoming provider appointments including: annual wellness visit -scheduled for Jan 2023; Reviewed cbg monitoring log values with pt and discussed ways to manage cbgs Lab Results  Component Value Date   HGBA1C 8.5 (A) 04/07/2021   Patient Goals/Self-Care Activities: Patient will self administer medications as prescribed Patient will attend all scheduled provider appointments Paitent will continue to lmonitor cbgs and BP in the home and alert MD of any abnormal values.  Follow Up Plan:  Telephone follow up appointment with care management team member scheduled for:  quarterly call within the month of Feb. The patient has been provided with contact information for the care management team and has been advised to call with any health related questions or concerns.  Plan: RN CM discussed with  patient next outreach within the month of Feb . Patient agrees to care plan and follow up.  [name], RN,BSN,CCM [facility] Telephonic Care Management Coordinator Direct Phone: [phone] Toll Free: [phone] Fax: [phone]

## 2021-06-02 ENCOUNTER — Ambulatory Visit: Payer: Medicare PPO

## 2021-06-16 ENCOUNTER — Encounter: Payer: Self-pay | Admitting: Internal Medicine

## 2021-06-16 ENCOUNTER — Ambulatory Visit
Admission: RE | Admit: 2021-06-16 | Discharge: 2021-06-16 | Disposition: A | Discharge status: Home / Self Care | Payer: Medicare PPO | Source: Ambulatory Visit | Attending: Internal Medicine | Admitting: Internal Medicine

## 2021-06-16 ENCOUNTER — Ambulatory Visit
Admission: RE | Admit: 2021-06-16 | Discharge: 2021-06-16 | Disposition: A | Discharge status: Home / Self Care | Payer: Medicare PPO | Attending: Internal Medicine | Admitting: Internal Medicine

## 2021-06-16 ENCOUNTER — Ambulatory Visit (INDEPENDENT_AMBULATORY_CARE_PROVIDER_SITE_OTHER): Payer: Medicare PPO | Admitting: Internal Medicine

## 2021-06-16 ENCOUNTER — Other Ambulatory Visit: Payer: Self-pay

## 2021-06-16 VITALS — BP 130/78 | HR 83 | Ht 64.0 in | Wt 158.4 lb

## 2021-06-16 DIAGNOSIS — M62838 Other muscle spasm: Secondary | ICD-10-CM | POA: Diagnosis not present

## 2021-06-16 DIAGNOSIS — M720 Palmar fascial fibromatosis [Dupuytren]: Secondary | ICD-10-CM | POA: Diagnosis not present

## 2021-06-16 DIAGNOSIS — M542 Cervicalgia: Secondary | ICD-10-CM | POA: Diagnosis not present

## 2021-06-16 DIAGNOSIS — E538 Deficiency of other specified B group vitamins: Secondary | ICD-10-CM

## 2021-06-16 DIAGNOSIS — R251 Tremor, unspecified: Secondary | ICD-10-CM | POA: Diagnosis not present

## 2021-06-16 MED ORDER — BACLOFEN 10 MG PO TABS
10.0000 mg | ORAL_TABLET | Freq: Three times a day (TID) | ORAL | 0 refills | Status: DC
Start: 2021-06-16 — End: 2021-06-17

## 2021-06-16 MED ORDER — CYANOCOBALAMIN 1000 MCG/ML IJ SOLN
1000.0000 ug | Freq: Once | INTRAMUSCULAR | Status: AC
Start: 2021-06-16 — End: 2021-06-16
  Administered 2021-06-16: 1000 ug via INTRAMUSCULAR

## 2021-06-16 NOTE — Progress Notes (Signed)
Date:  06/16/2021   Name:  Sheila Sandoval   DOB:  01/17/1946   MRN:  [id number]   Chief Complaint: Tremors (Right Hand Shaking in the last year and getting worse. Both hands cramping. ) and Shoulder Pain (Left Shoulder blade, and under arm put. Hurts when she lays down. Feels better when putting pressure on it. Feels a tight knot in her shoulder blade. X 1 week.)  HPI  Lab Results  Component Value Date   CREATININE 1.06 (H) 10/31/2020   BUN 20 10/31/2020   NA 139 10/31/2020   K 4.4 10/31/2020   CL 99 10/31/2020   CO2 23 10/31/2020   Lab Results  Component Value Date   CHOL 139 10/31/2020   HDL 57 10/31/2020   LDLCALC 63 10/31/2020   TRIG 101 10/31/2020   CHOLHDL 2.4 10/31/2020   Lab Results  Component Value Date   TSH 1.380 10/31/2020   Lab Results  Component Value Date   HGBA1C 8.5 (A) 04/07/2021   Lab Results  Component Value Date   WBC 5.6 10/31/2020   HGB 13.3 10/31/2020   HCT 42.9 10/31/2020   MCV 73 (L) 10/31/2020   PLT 258 10/31/2020   Lab Results  Component Value Date   ALT 20 10/31/2020   AST 15 10/31/2020   ALKPHOS 88 10/31/2020   BILITOT 0.4 10/31/2020   No components found for: VITD  Review of Systems  Constitutional:  Negative for chills, fatigue and fever.  Respiratory:  Negative for cough, chest tightness and shortness of breath.   Cardiovascular:  Negative for chest pain and leg swelling.  Musculoskeletal:  Positive for arthralgias and myalgias.  Neurological:  Positive for tremors. Negative for weakness and numbness.  Psychiatric/Behavioral:  Negative for dysphoric mood and sleep disturbance. The patient is not nervous/anxious.    Patient Active Problem List   Diagnosis Date Noted   Stage 3a chronic kidney disease ([place]) 04/21/2020   Type II diabetes mellitus with complication ([place]) 62/56/3893   Anatomical narrow angle glaucoma with borderline intraocular pressure 07/15/2017   Vitamin D deficiency 03/11/2017   Hypercalcemia  10/02/2016   Pernicious anemia 04/11/2015   Carpal tunnel syndrome 01/14/2015   Hyperlipidemia associated with type 2 diabetes mellitus ([place]) 01/14/2015   Essential hypertension 01/14/2015   Climacteric 01/14/2015   Muscle fatigue 01/14/2015   Arthritis of hand, degenerative 01/14/2015   GERD (gastroesophageal reflux disease) 10/29/2011   Spondylolisthesis 10/29/2011    Allergies  Allergen Reactions   Atorvastatin     chest tightness   Tramadol Hcl Nausea And Vomiting    Past Surgical History:  Procedure Laterality Date   [place] / CSF LEAK  2002    Social History   Tobacco Use   Smoking status: Never   Smokeless tobacco: Never   Tobacco comments:    smoking cessation material not required  Vaping Use   Vaping Use: Never used  Substance Use Topics   Alcohol use: No    Alcohol/week: 0.0 standard drinks   Drug use: No     Medication list has been reviewed and updated.  Current Meds  Medication Sig   ACCU-CHEK GUIDE test strip USE AS INSTRUCTED   Accu-Chek Softclix Lancets lancets USE AS DIRECTED   amLODipine (NORVASC) 10 MG tablet TAKE 1 TABLET EVERY DAY   ammonium lactate (AMLACTIN) 12 % cream APPLY TOPICALLY AS NEEDED FOR DRY SKIN.   aspirin 81  MG tablet Take 1 tablet by mouth daily.   blood glucose meter kit and supplies Dispense based on patient and insurance preference. Use up to four times daily as directed. (FOR ICD-10 E10.9, E11.9).   Cholecalciferol 2000 units CAPS Take 1 capsule by mouth daily.   diclofenac Sodium (VOLTAREN) 1 % GEL APPLY 2 GRAMS TOPICALLY 4 (FOUR) TIMES DAILY.   ezetimibe (ZETIA) 10 MG tablet Take 1 tablet (10 mg total) by mouth daily.   Ferrous Gluconate 325 (36 FE) MG TABS Take by mouth.   fluticasone (FLONASE) 50 MCG/ACT nasal spray USE 2 SPRAYS INTO BOTH NOSTRILS DAILY.   glimepiride (AMARYL) 2 MG tablet TAKE 1 TABLET EVERY DAY WITH BREAKFAST   JANUMET 50-1000 MG tablet  TAKE 1 TABLET TWICE DAILY WITH MEALS   JARDIANCE 25 MG TABS tablet TAKE 1 TABLET EVERY DAY   nabumetone (RELAFEN) 750 MG tablet Take 1 tablet (750 mg total) by mouth at bedtime as needed.   ramipril (ALTACE) 5 MG capsule TAKE 1 CAPSULE EVERY DAY   rosuvastatin (CRESTOR) 5 MG tablet TAKE 1 TABLET EVERY DAY   triamcinolone cream (KENALOG) 0.1 % APPLY TOPICALLY TWICE DAILY   triamterene-hydrochlorothiazide (MAXZIDE-25) 37.5-25 MG tablet TAKE 1 TABLET EVERY DAY   Current Facility-Administered Medications for the 06/16/21 encounter (Office Visit) with Glean Hess, MD  Medication   cyanocobalamin ((VITAMIN B-12)) injection 1,000 mcg    PHQ 2/9 Scores 06/16/2021 04/07/2021 01/05/2021 11/25/2020  PHQ - 2 Score 1 0 0 1  PHQ- 9 Score 1 0 0 2    GAD 7 : Generalized Anxiety Score 06/16/2021 04/07/2021 11/25/2020 10/31/2020  Nervous, Anxious, on Edge 0 0 0 0  Control/stop worrying 0 0 0 0  Worry too much - different things 0 0 0 0  Trouble relaxing 0 0 0 0  Restless 0 0 0 0  Easily annoyed or irritable 0 0 0 0  Afraid - awful might happen 0 0 1 0  Total GAD 7 Score 0 0 1 0  Anxiety Difficulty Not difficult at all - Not difficult at all -    BP Readings from Last 3 Encounters:  06/16/21 130/78  04/07/21 122/72  11/25/20 132/78    Physical Exam Vitals and nursing note reviewed.  Constitutional:      General: She is not in acute distress.    Appearance: She is well-developed.  HENT:     Head: Normocephalic and atraumatic.  Cardiovascular:     Rate and Rhythm: Normal rate and regular rhythm.     Pulses: Normal pulses.  Pulmonary:     Effort: Pulmonary effort is normal. No respiratory distress.     Breath sounds: No wheezing or rhonchi.  Musculoskeletal:     Left hand: Tenderness (palmar thickening) present.     Cervical back: Spasms and tenderness present. Decreased range of motion.     Right lower leg: No edema.     Left lower leg: No edema.  Skin:    General: Skin is warm and  dry.     Findings: No rash.  Neurological:     Mental Status: She is alert and oriented to person, place, and time.     Motor: Tremor (RUE intention tremor) present. No weakness, atrophy, abnormal muscle tone or pronator drift.     Coordination: Coordination is intact.     Gait: Gait is intact.  Psychiatric:        Mood and Affect: Mood normal.  Behavior: Behavior normal.    Wt Readings from Last 3 Encounters:  06/16/21 158 lb 6.4 oz (71.8 kg)  04/07/21 159 lb (72.1 kg)  11/25/20 161 lb (73 kg)    BP 130/78   Pulse 83   Ht 5' 4"  (1.626 m)   Wt 158 lb 6.4 oz (71.8 kg)   SpO2 98%   BMI 27.19 kg/m   Assessment and Plan: 1. Tremor Likely benign essential tremor - Ambulatory referral to Neurology  2. Dupuytren's contracture of left hand Recommend Ortho evaluation - Ambulatory referral to Orthopedic Surgery  3. Neck muscle spasm - baclofen (LIORESAL) 10 MG tablet; Take 1 tablet (10 mg total) by mouth 3 (three) times daily.  Dispense: 90 each; Refill: 0 - DG Cervical Spine Complete  4. B12 deficiency Monthly injection today - cyanocobalamin ((VITAMIN B-12)) injection 1,000 mcg   Partially dictated using Editor, commissioning. Any errors are unintentional.  Halina Maidens, MD [facility]  06/16/2021

## 2021-06-17 ENCOUNTER — Other Ambulatory Visit: Payer: Self-pay

## 2021-06-17 DIAGNOSIS — M62838 Other muscle spasm: Secondary | ICD-10-CM

## 2021-06-17 MED ORDER — BACLOFEN 10 MG PO TABS: 10.0000 mg | ORAL_TABLET | Freq: Three times a day (TID) | ORAL | 0 refills | Status: DC

## 2021-06-29 ENCOUNTER — Other Ambulatory Visit: Payer: Self-pay | Admitting: Internal Medicine

## 2021-06-30 NOTE — Telephone Encounter (Signed)
Requested Prescriptions  Pending Prescriptions Disp Refills  . ezetimibe (ZETIA) 10 MG tablet [Pharmacy Med Name: EZETIMIBE 10 MG Tablet] 90 tablet 0    Sig: TAKE 1 TABLET EVERY DAY     Cardiovascular:  Antilipid - Sterol Transport Inhibitors Passed - 06/29/2021  5:37 PM      Passed - Total Cholesterol in normal range and within 360 days    Cholesterol, Total  Date Value Ref Range Status  10/31/2020 139 100 - 199 mg/dL Final         Passed - LDL in normal range and within 360 days    LDL Chol Calc (NIH)  Date Value Ref Range Status  10/31/2020 63 0 - 99 mg/dL Final         Passed - HDL in normal range and within 360 days    HDL  Date Value Ref Range Status  10/31/2020 57 >39 mg/dL Final         Passed - Triglycerides in normal range and within 360 days    Triglycerides  Date Value Ref Range Status  10/31/2020 101 0 - 149 mg/dL Final         Passed - Valid encounter within last 12 months    Recent Outpatient Visits          2 weeks ago Tremor   [facility] Glean Hess, MD   2 months ago Type II diabetes mellitus with complication [facility])   [facility] Glean Hess, MD   7 months ago Neoplasm of uncertain behavior of skin   [facility] Glean Hess, MD   8 months ago Type II diabetes mellitus with complication [facility])   [facility] Glean Hess, MD   1 year ago Type II diabetes mellitus with complication [facility])   [facility] Glean Hess, MD      Future Appointments            In 1 month Army Melia, Jesse Sans, MD [facility], [facility]

## 2021-07-05 DIAGNOSIS — M25561 Pain in right knee: Secondary | ICD-10-CM | POA: Diagnosis not present

## 2021-07-06 ENCOUNTER — Ambulatory Visit: Payer: Self-pay

## 2021-07-06 ENCOUNTER — Telehealth: Payer: Self-pay | Admitting: Internal Medicine

## 2021-07-06 NOTE — Telephone Encounter (Signed)
  Chief Complaint: Knee Pain/ Flashes in her eyes which she thinks is d/t new medication. Symptoms: Pain  Frequency: constant Pertinent Negatives: Patient denies  Disposition: [] ED /[] Urgent Care (no appt availability in office) / [x] Appointment(In office/virtual)/ []  [place] [facility]/ [] Home Care/ [] Refused Recommended Disposition  Additional Notes: Patient insists upon appointment today.  No appointments available today. Called [place] they asked for me to send note.    Reason for Disposition  [1] MODERATE pain (e.g., interferes with normal activities, limping) AND [2] present > 3 days  Answer Assessment - Initial Assessment Questions 1. LOCATION and RADIATION: "Where is the pain located?"      Left hip, left knee 2. QUALITY: "What does the pain feel like?"  (e.g., sharp, dull, aching, burning)     sharp 3. SEVERITY: "How bad is the pain?" "What does it keep you from doing?"   (Scale 1-10; or mild, moderate, severe)   -  MILD (1-3): doesn't interfere with normal activities    -  MODERATE (4-7): interferes with normal activities (e.g., work or school) or awakens from sleep, limping    -  SEVERE (8-10): excruciating pain, unable to do any normal activities, unable to walk     8 4. ONSET: "When did the pain start?" "Does it come and go, or is it there all the time?"     Friday 5. WORK OR EXERCISE: "Has there been any recent work or exercise that involved this part of the body?"      na 6. CAUSE: "What do you think is causing the hip pain?"      unknown 7. AGGRAVATING FACTORS: "What makes the hip pain worse?" (e.g., walking, climbing stairs, running)      8. OTHER SYMPTOMS: "Do you have any other symptoms?" (e.g., back pain, pain shooting down leg,  fever, rash)  Protocols used: Hip Pain-A-AH

## 2021-07-06 NOTE — Telephone Encounter (Signed)
Copied from [place] [id number]. Topic: Appointment Scheduling - Scheduling Inquiry for Clinic >> Jul 06, 2021  1:48 PM Erick Blinks wrote: Reason for CRM: Pt called office seeking appt today per Nurse Triage, advised office will contact her as soon as they are able to Best contact: [phone]

## 2021-07-06 NOTE — Telephone Encounter (Signed)
Please call pt to schedule an appt.  KP

## 2021-07-06 NOTE — Telephone Encounter (Signed)
Patient called during lunch wanting to be seen today because of hip pain. Patient just called back again and is upset because no one has returned her called. I explained to the pec that patient just called during lunch and we are out of the office from 12-1:20 and has only been back for a few minutes. I explained to pec to assure patient that someone will be in touch as soon as possible.

## 2021-07-07 ENCOUNTER — Encounter: Payer: Self-pay | Admitting: Internal Medicine

## 2021-07-07 ENCOUNTER — Other Ambulatory Visit: Payer: Self-pay

## 2021-07-07 ENCOUNTER — Ambulatory Visit (INDEPENDENT_AMBULATORY_CARE_PROVIDER_SITE_OTHER): Payer: Medicare PPO | Admitting: Internal Medicine

## 2021-07-07 ENCOUNTER — Ambulatory Visit: Payer: Self-pay

## 2021-07-07 ENCOUNTER — Telehealth: Payer: Self-pay

## 2021-07-07 VITALS — BP 122/74 | HR 74 | Ht 64.0 in | Wt 161.0 lb

## 2021-07-07 DIAGNOSIS — K5904 Chronic idiopathic constipation: Secondary | ICD-10-CM

## 2021-07-07 DIAGNOSIS — M25561 Pain in right knee: Secondary | ICD-10-CM

## 2021-07-07 LAB — POCT URINALYSIS DIPSTICK
Bilirubin, UA: NEGATIVE
Blood, UA: NEGATIVE
Glucose, UA: POSITIVE — AB
Ketones, UA: NEGATIVE
Leukocytes, UA: NEGATIVE
Nitrite, UA: NEGATIVE
Protein, UA: NEGATIVE
Spec Grav, UA: 1.01 (ref 1.010–1.025)
Urobilinogen, UA: 0.2 E.U./dL
pH, UA: 6 (ref 5.0–8.0)

## 2021-07-07 LAB — HM DIABETES EYE EXAM

## 2021-07-07 NOTE — Progress Notes (Signed)
Date:  07/07/2021   Name:  Sheila Sandoval   DOB:  [birth year]   MRN:  [id number]   Chief Complaint: Hip Pain and Flank Pain (Left - No urinary Issues. )  Abdominal Pain This is a new problem. The current episode started in the past 7 days. The onset quality is gradual. The problem occurs constantly. The problem has been unchanged. The pain is located in the LLQ. The pain is mild. The quality of the pain is colicky. The abdominal pain does not radiate. Associated symptoms include arthralgias and constipation. Pertinent negatives include no fever, hematuria, nausea or vomiting. Treatments tried: stool softeners. The treatment provided no relief.  Knee Pain  There was no injury mechanism. The pain is present in the right knee. The quality of the pain is described as shooting (intermittent catching pain with giving way). The pain is mild. The pain has been Fluctuating since onset. She has tried NSAIDs and immobilization (had essentially normal xrays at ED, now wearing a soft brace) for the symptoms.   Lab Results  Component Value Date   NA 139 10/31/2020   K 4.4 10/31/2020   CO2 23 10/31/2020   GLUCOSE 173 (H) 10/31/2020   BUN 20 10/31/2020   CREATININE 1.06 (H) 10/31/2020   CALCIUM 10.7 (H) 10/31/2020   EGFR 55 (L) 10/31/2020   GFRNONAA 47 (L) 04/21/2020   Lab Results  Component Value Date   CHOL 139 10/31/2020   HDL 57 10/31/2020   LDLCALC 63 10/31/2020   TRIG 101 10/31/2020   CHOLHDL 2.4 10/31/2020   Lab Results  Component Value Date   TSH 1.380 10/31/2020   Lab Results  Component Value Date   HGBA1C 8.5 (A) 04/07/2021   Lab Results  Component Value Date   WBC 5.6 10/31/2020   HGB 13.3 10/31/2020   HCT 42.9 10/31/2020   MCV 73 (L) 10/31/2020   PLT 258 10/31/2020   Lab Results  Component Value Date   ALT 20 10/31/2020   AST 15 10/31/2020   ALKPHOS 88 10/31/2020   BILITOT 0.4 10/31/2020   Lab Results  Component Value Date   VD25OH 41.1 04/21/2020      Review of Systems  Constitutional:  Negative for appetite change, chills, fatigue, fever and unexpected weight change.  Respiratory:  Negative for cough, chest tightness and shortness of breath.   Cardiovascular:  Negative for chest pain and leg swelling.  Gastrointestinal:  Positive for abdominal pain and constipation. Negative for blood in stool, nausea and vomiting.  Genitourinary:  Negative for hematuria.  Musculoskeletal:  Positive for arthralgias and gait problem. Negative for joint swelling.   Patient Active Problem List   Diagnosis Date Noted   Stage 3a chronic kidney disease ([place]) 04/21/2020   Type II diabetes mellitus with complication ([place]) 04/17/3006   Anatomical narrow angle glaucoma with borderline intraocular pressure 07/15/2017   Vitamin D deficiency 03/11/2017   Hypercalcemia 10/02/2016   Pernicious anemia 04/11/2015   Carpal tunnel syndrome 01/14/2015   Hyperlipidemia associated with type 2 diabetes mellitus ([place]) 01/14/2015   Essential hypertension 01/14/2015   Climacteric 01/14/2015   Muscle fatigue 01/14/2015   Arthritis of hand, degenerative 01/14/2015   GERD (gastroesophageal reflux disease) 10/29/2011   Spondylolisthesis 10/29/2011    Allergies  Allergen Reactions   Atorvastatin     chest tightness   Tramadol Hcl Nausea And Vomiting    Past Surgical History:  Procedure Laterality Date   [place]  [place] / CSF LEAK  2002    Social History   Tobacco Use   Smoking status: Never   Smokeless tobacco: Never   Tobacco comments:    smoking cessation material not required  Vaping Use   Vaping Use: Never used  Substance Use Topics   Alcohol use: No    Alcohol/week: 0.0 standard drinks   Drug use: No     Medication list has been reviewed and updated.  Current Meds  Medication Sig   ACCU-CHEK GUIDE test strip USE AS INSTRUCTED   Accu-Chek Softclix Lancets lancets USE AS DIRECTED   amLODipine  (NORVASC) 10 MG tablet TAKE 1 TABLET EVERY DAY   ammonium lactate (AMLACTIN) 12 % cream APPLY TOPICALLY AS NEEDED FOR DRY SKIN.   aspirin 81 MG tablet Take 1 tablet by mouth daily.   baclofen (LIORESAL) 10 MG tablet Take 1 tablet (10 mg total) by mouth 3 (three) times daily.   blood glucose meter kit and supplies Dispense based on patient and insurance preference. Use up to four times daily as directed. (FOR ICD-10 E10.9, E11.9).   Cholecalciferol 2000 units CAPS Take 1 capsule by mouth daily.   diclofenac Sodium (VOLTAREN) 1 % GEL APPLY 2 GRAMS TOPICALLY 4 (FOUR) TIMES DAILY.   ezetimibe (ZETIA) 10 MG tablet TAKE 1 TABLET EVERY DAY   Ferrous Gluconate 325 (36 FE) MG TABS Take by mouth.   fluticasone (FLONASE) 50 MCG/ACT nasal spray USE 2 SPRAYS INTO BOTH NOSTRILS DAILY.   glimepiride (AMARYL) 2 MG tablet TAKE 1 TABLET EVERY DAY WITH BREAKFAST   JANUMET 50-1000 MG tablet TAKE 1 TABLET TWICE DAILY WITH MEALS   JARDIANCE 25 MG TABS tablet TAKE 1 TABLET EVERY DAY   nabumetone (RELAFEN) 750 MG tablet Take 1 tablet (750 mg total) by mouth at bedtime as needed.   ramipril (ALTACE) 5 MG capsule TAKE 1 CAPSULE EVERY DAY   rosuvastatin (CRESTOR) 5 MG tablet TAKE 1 TABLET EVERY DAY   triamcinolone cream (KENALOG) 0.1 % APPLY TOPICALLY TWICE DAILY   triamterene-hydrochlorothiazide (MAXZIDE-25) 37.5-25 MG tablet TAKE 1 TABLET EVERY DAY   Current Facility-Administered Medications for the 07/07/21 encounter (Office Visit) with Glean Hess, MD  Medication   cyanocobalamin ((VITAMIN B-12)) injection 1,000 mcg    PHQ 2/9 Scores 07/07/2021 06/16/2021 04/07/2021 01/05/2021  PHQ - 2 Score 0 1 0 0  PHQ- 9 Score 0 1 0 0    GAD 7 : Generalized Anxiety Score 07/07/2021 06/16/2021 04/07/2021 11/25/2020  Nervous, Anxious, on Edge 0 0 0 0  Control/stop worrying 0 0 0 0  Worry too much - different things 0 0 0 0  Trouble relaxing 3 0 0 0  Restless 0 0 0 0  Easily annoyed or irritable 3 0 0 0  Afraid - awful  might happen 3 0 0 1  Total GAD 7 Score 9 0 0 1  Anxiety Difficulty Not difficult at all Not difficult at all - Not difficult at all    BP Readings from Last 3 Encounters:  07/07/21 122/74  06/16/21 130/78  04/07/21 122/72    Physical Exam Vitals and nursing note reviewed.  Constitutional:      General: She is not in acute distress.    Appearance: Normal appearance. She is well-developed.  HENT:     Head: Normocephalic and atraumatic.  Cardiovascular:     Rate and Rhythm: Normal rate and regular rhythm.     Pulses: Normal pulses.  Pulmonary:  Effort: Pulmonary effort is normal. No respiratory distress.     Breath sounds: No wheezing or rhonchi.  Abdominal:     General: Abdomen is protuberant. Bowel sounds are normal.     Palpations: Abdomen is soft.     Tenderness: There is abdominal tenderness in the left lower quadrant. There is no right CVA tenderness, left CVA tenderness, guarding or rebound.  Musculoskeletal:     Cervical back: Normal range of motion.     Right hip: Normal.     Left hip: Normal.  Lymphadenopathy:     Cervical: No cervical adenopathy.  Skin:    General: Skin is warm and dry.     Findings: No rash.  Neurological:     Mental Status: She is alert and oriented to person, place, and time.  Psychiatric:        Mood and Affect: Mood normal.        Behavior: Behavior normal.    Wt Readings from Last 3 Encounters:  07/07/21 161 lb (73 kg)  06/16/21 158 lb 6.4 oz (71.8 kg)  04/07/21 159 lb (72.1 kg)    BP 122/74    Pulse 74    Ht 5' 4"  (1.626 m)    Wt 161 lb (73 kg)    SpO2 98%    BMI 27.64 kg/m   Assessment and Plan: 1. Chronic idiopathic constipation Continue Colace daily with plenty of fluids Use a bottle of Mag Citrate for rapid relief. - POCT urinalysis dipstick - negative  2. Acute pain of right knee With catching intermittently Recommend Ortho Evaluation - Ambulatory referral to Orthopedic Surgery   Partially dictated using Dragon  software. Any errors are unintentional.  Halina Maidens, MD [facility]  07/07/2021

## 2021-07-07 NOTE — Telephone Encounter (Signed)
Called pt could not leave VM to call back. Called about appointment today for hip pain. Need more information and details. Pt just went to [facility] for knee. Pt sees ortho as well.  PEC nurse may give results to patient if they return call to clinic, a CRM has been created.  KP

## 2021-07-07 NOTE — Telephone Encounter (Signed)
  Chief Complaint: Right side pain, not "really my hip" Symptoms: Right side pain that goes into hip area Frequency: Started Friday Pertinent Negatives: Patient denies  Disposition: [] ED /[] Urgent Care (no appt availability in office) / [] Appointment(In office/virtual)/ []  [place] [facility]/ [] Home Care/ [] Refused Recommended Disposition  Additional Notes: States she has been "very constipated recently." Pt. Has an appointment today.   Answer Assessment - Initial Assessment Questions 1. LOCATION: "Where does it hurt?"      Right side pain 2. RADIATION: "Does the pain shoot anywhere else?" (e.g., chest, back)     Into hip 3. ONSET: "When did the pain begin?" (e.g., minutes, hours or days ago)      Friday 4. SUDDEN: "Gradual or sudden onset?"     Sudden 5. PATTERN "Does the pain come and go, or is it constant?"    - If constant: "Is it getting better, staying the same, or worsening?"      (Note: Constant means the pain never goes away completely; most serious pain is constant and it progresses)     - If intermittent: "How long does it last?" "Do you have pain now?"     (Note: Intermittent means the pain goes away completely between bouts)     Constant 6. SEVERITY: "How bad is the pain?"  (e.g., Scale 1-10; mild, moderate, or severe)   - MILD (1-3): doesn't interfere with normal activities, abdomen soft and not tender to touch    - MODERATE (4-7): interferes with normal activities or awakens from sleep, abdomen tender to touch    - SEVERE (8-10): excruciating pain, doubled over, unable to do any normal activities      Now - 9 7. RECURRENT SYMPTOM: "Have you ever had this type of stomach pain before?" If Yes, ask: "When was the last time?" and "What happened that time?"      No 8. CAUSE: "What do you think is causing the stomach pain?"     Unsure 9. RELIEVING/AGGRAVATING FACTORS: "What makes it better or worse?" (e.g., movement, antacids, bowel movement)     No 10. OTHER SYMPTOMS:  "Do you have any other symptoms?" (e.g., back pain, diarrhea, fever, urination pain, vomiting)       Constipated - last bowel movement last night 11. PREGNANCY: "Is there any chance you are pregnant?" "When was your last menstrual period?"       No  Protocols used: Abdominal Pain - [facility]

## 2021-07-07 NOTE — Patient Instructions (Addendum)
Magnesium citrate - get this over the counter in a green bottle at the pharmacy.  Then keep taking Metamucil once a day to keep your bowel moving.

## 2021-07-08 ENCOUNTER — Other Ambulatory Visit: Payer: Self-pay | Admitting: Internal Medicine

## 2021-07-08 DIAGNOSIS — M62838 Other muscle spasm: Secondary | ICD-10-CM

## 2021-07-08 NOTE — Telephone Encounter (Signed)
Last labs 10/31/20.  Requested Prescriptions  Pending Prescriptions Disp Refills   glimepiride (AMARYL) 2 MG tablet [Pharmacy Med Name: GLIMEPIRIDE 2 MG Tablet] 90 tablet 1    Sig: TAKE 1 TABLET EVERY DAY WITH BREAKFAST     Endocrinology:  Diabetes - Sulfonylureas Failed - 07/08/2021 10:51 AM      Failed - HBA1C is between 0 and 7.9 and within 180 days    Hemoglobin A1C  Date Value Ref Range Status  04/07/2021 8.5 (A) 4.0 - 5.6 % Final   Hgb A1c MFr Bld  Date Value Ref Range Status  04/21/2020 8.5 (H) 4.8 - 5.6 % Final    Comment:             Prediabetes: 5.7 - 6.4          Diabetes: >6.4          Glycemic control for adults with diabetes: <7.0          Passed - Valid encounter within last 6 months    Recent Outpatient Visits          Yesterday Chronic idiopathic constipation   [facility]   3 weeks ago Tremor   [facility]   3 months ago Type II diabetes mellitus with complication [facility])   [facility]   7 months ago Neoplasm of uncertain behavior of skin   [facility] Glean Hess, MD   8 months ago Type II diabetes mellitus with complication [facility])   [facility] Glean [facility], [facility]      Future Appointments            In 3 weeks Army Melia Jesse Sans, MD [facility] [place] Softclix Lancets lancets [place] Med Name: ACCU-CHEK SOFTCLIX LANCETS] 300 each 0    Sig: USE AS DIRECTED     Endocrinology: Diabetes - Testing Supplies Passed - 07/08/2021 10:51 AM      Passed - Valid encounter within last 12 months    Recent Outpatient Visits          Yesterday Chronic idiopathic constipation   [facility], [facility]   3 weeks ago Tremor   [facility]   3 months ago Type II diabetes mellitus with complication [facility])   [facility]   7 months  ago Neoplasm of uncertain behavior of skin   [facility] Glean Hess, MD   8 months ago Type II diabetes mellitus with complication [facility])   [facility] Gle[facility], [facility]      Future Appointments            In 3 weeks Glean Hess, MD [facility]            amLODipine (NORVASC) 10 MG tablet [Pharmacy Med Name: AMLODIPINE BESYLATE 10 MG Tablet] 90 tablet 0    Sig: TAKE 1 TABLET EVERY DAY     Cardiovascular:  Calcium Channel Blockers Passed - 07/08/2021 10:51 AM      Passed - Last BP in normal range    BP Readings from Last 1 Encounters:  07/07/21 122/74         Passed - Valid encounter within last 6 months    Recent Outpatient Visits  Yesterday Chronic idiopathic constipation   [facility] Gle[facility], [facility]   3 weeks ago Tremor   [facility], [facility]   3 months ago Type II diabetes mellitus with complication [facility])   [facility]   7 months ago Neoplasm of uncertain behavior of skin   [facility] Gle[facility], MD   8 months ago Type II diabetes mellitus with complication [facility])   [facility] Gle[facility], MD      Future Appointments            In 3 weeks Gle[facility] Hess, MD [facility]            ramipril (ALTACE) 5 MG capsule [Pharmacy Med Name: RAMIPRIL 5 MG Capsule] 90 capsule 0    Sig: TAKE 1 CAPSULE EVERY DAY     Cardiovascular:  ACE Inhibitors Failed - 07/08/2021 10:51 AM      Failed - Cr in normal range and within 180 days    Creatinine, Ser  Date Value Ref Range Status  10/31/2020 1.06 (H) 0.57 - 1.00 mg/dL Final         Failed - K in normal range and within 180 days    Potassium  Date Value Ref Range Status  10/31/2020 4.4 3.5 - 5.2 mmol/L Final         Passed - Patient is not pregnant      Passed - Last BP in normal range    BP Readings from Last 1 Encounters:  07/07/21  122/74         Passed - Valid encounter within last 6 months    Recent Outpatient Visits          Yesterday Chronic idiopathic constipation   [facility] Gle[facility], MD   3 weeks ago Tremor   [facility] Glean Hess, MD   3 months ago Type II diabetes mellitus with complication [facility])   [facility]   7 months ago Neoplasm of uncertain behavior of skin   [facility], [facility]   8 months ago Type II diabetes mellitus with complication [facility])   [facility], [facility]      Future Appointments            In 3 weeks Glean Hess, MD [facility]            triamterene-hydrochlorothiazide (MAXZIDE-25) 37.5-25 MG tablet [Pharmacy Med Name: TRIAMTERENE/HYDROCHLOROTHIAZIDE 37.5-25 MG Tablet] 90 tablet 0    Sig: TAKE 1 TABLET EVERY DAY     Cardiovascular: Diuretic Combos Failed - 07/08/2021 10:51 AM      Failed - Cr in normal range and within 360 days    Creatinine, Ser  Date Value Ref Range Status  10/31/2020 1.06 (H) 0.57 - 1.00 mg/dL Final         Failed - Ca in normal range and within 360 days    Calcium  Date Value Ref Range Status  10/31/2020 10.7 (H) 8.7 - 10.3 mg/dL Final         Passed - K in normal range and within 360 days    Potassium  Date Value Ref Range Status  10/31/2020 4.4 3.5 - 5.2 mmol/L Final         Passed - Na in normal range and within 360 days  Sodium  Date Value Ref Range Status  10/31/2020 139 134 - 144 mmol/L Final         Passed - Last BP in normal range    BP Readings from Last 1 Encounters:  07/07/21 122/74         Passed - Valid encounter within last 6 months    Recent Outpatient Visits          Yesterday Chronic idiopathic constipation   [facility] Glean [facility], [facility]   3 weeks ago Tremor   [facility] Gle[facility], MD   3 months ago Type II diabetes mellitus with  complication [facility])   [facility], [facility]   7 months ago Neoplasm of uncertain behavior of skin   [facility]   8 months ago Type II diabetes mellitus with complication [facility])   [facility] Gle[facility], [facility]      Future Appointments            In 3 weeks Glean Hess, MD [facility]            baclofen (LIORESAL) 10 MG tablet [Pharmacy Med Name: BACLOFEN 10 MG Tablet] 90 tablet     Sig: TAKE 1 TABLET THREE TIMES DAILY     Not Delegated - Analgesics:  Muscle Relaxants Failed - 07/08/2021 10:51 AM      Failed - This refill cannot be delegated      Passed - Valid encounter within last 6 months    Recent Outpatient Visits          Yesterday Chronic idiopathic constipation   [facility] Glean Hess, MD   3 weeks ago Tremor   [facility]   3 months ago Type II diabetes mellitus with complication [facility])   [facility], [facility]   7 months ago Neoplasm of uncertain behavior of skin   [facility] Gle[facility], MD   8 months ago Type II diabetes mellitus with complication [facility])   [facility] Gle[facility], MD      Future Appointments            In 3 weeks Glean Hess, MD [facility]            JARDIANCE 25 MG TABS tablet [Pharmacy Med Name: JARDIANCE 25 MG Tablet] 90 tablet 0    Sig: TAKE 1 TABLET EVERY DAY     Endocrinology:  Diabetes - SGLT2 Inhibitors Failed - 07/08/2021 10:51 AM      Failed - Cr in normal range and within 360 days    Creatinine, Ser  Date Value Ref Range Status  10/31/2020 1.06 (H) 0.57 - 1.00 mg/dL Final         Failed - HBA1C is between 0 and 7.9 and within 180 days    Hemoglobin A1C  Date Value Ref Range Status  04/07/2021 8.5 (A) 4.0 - 5.6 % Final   Hgb A1c MFr Bld  Date Value Ref Range Status  04/21/2020 8.5 (H) 4.8 - 5.6 % Final     Comment:             Prediabetes: 5.7 - 6.4          Diabetes: >6.4          Glycemic control for adults with diabetes: <7.0  Failed - AA eGFR in normal range and within 360 days    GFR calc Af Amer  Date Value Ref Range Status  04/21/2020 55 (L) >59 mL/min/1.73 Final    Comment:    **[facility] currently reports eGFR in compliance with the current**   recommendations of the [employer]. [facility] will   update reporting as new guidelines are published from the NKF-ASN   Task force.    GFR calc non Af Amer  Date Value Ref Range Status  04/21/2020 47 (L) >59 mL/min/1.73 Final   eGFR  Date Value Ref Range Status  10/31/2020 55 (L) >59 mL/min/1.73 Final         Passed - LDL in normal range and within 360 days    LDL Chol Calc (NIH)  Date Value Ref Range Status  10/31/2020 63 0 - 99 mg/dL Final         Passed - Valid encounter within last 6 months    Recent Outpatient Visits          Yesterday Chronic idiopathic constipation   [facility] Glean Hess, MD   3 weeks ago Tremor   [facility]   3 months ago Type II diabetes mellitus with complication [facility])   [facility]   7 months ago Neoplasm of uncertain behavior of skin   [facility], [facility]   8 months ago Type II diabetes mellitus with complication [facility])   [facility] Glean [facility], MD      Future Appointments            In 3 weeks Army Melia Jesse Sans, MD [facility], [facility]

## 2021-07-08 NOTE — Telephone Encounter (Signed)
Requested medication (s) are due for refill today: yes  Requested medication (s) are on the active medication list: yes  Last refill:  06/17/21 #45 0 refills  Future visit scheduled: yes  Notes to clinic:  not delegated per protocol     Requested Prescriptions  Pending Prescriptions Disp Refills   baclofen (LIORESAL) 10 MG tablet [Pharmacy Med Name: BACLOFEN 10 MG Tablet] 90 tablet     Sig: TAKE 1 TABLET THREE TIMES DAILY     Not Delegated - Analgesics:  Muscle Relaxants Failed - 07/08/2021 10:51 AM      Failed - This refill cannot be delegated      Passed - Valid encounter within last 6 months    Recent Outpatient Visits           Yesterday Chronic idiopathic constipation   [facility]   3 weeks ago Tremor   [facility]   3 months ago Type II diabetes mellitus with complication [facility])   [facility], [facility]   7 months ago Neoplasm of uncertain behavior of skin   [facility] Gle[facility] Hess, MD   8 months ago Type II diabetes mellitus with complication [facility])   [facility] Gle[facility], [facility]       Future Appointments             In 3 weeks Glean Hess, MD [facility]            Signed Prescriptions Disp Refills   glimepiride (AMARYL) 2 MG tablet 90 tablet 1    Sig: TAKE 1 TABLET EVERY DAY WITH BREAKFAST     Endocrinology:  Diabetes - Sulfonylureas Failed - 07/08/2021 10:51 AM      Failed - HBA1C is between 0 and 7.9 and within 180 days    Hemoglobin A1C  Date Value Ref Range Status  04/07/2021 8.5 (A) 4.0 - 5.6 % Final   Hgb A1c MFr Bld  Date Value Ref Range Status  04/21/2020 8.5 (H) 4.8 - 5.6 % Final    Comment:             Prediabetes: 5.7 - 6.4          Diabetes: >6.4          Glycemic control for adults with diabetes: <7.0           Passed - Valid encounter within last 6 months    Recent Outpatient Visits            Yesterday Chronic idiopathic constipation   [facility]   3 weeks ago Tremor   [facility] Gle[facility], MD   3 months ago Type II diabetes mellitus with complication [facility])   [facility]   7 months ago Neoplasm of uncertain behavior of skin   [facility] Glean Hess, MD   8 months ago Type II diabetes mellitus with complication [facility])   [facility] Gle[facility], [facility]       Future Appointments             In 3 weeks Army Melia Jesse Sans, MD [facility]             Accu-Chek Softclix Lancets lancets 300 each 0    Sig: USE AS DIRECTED     Endocrinology:  Diabetes - Testing Supplies Passed - 07/08/2021 10:51 AM      Passed - Valid encounter within last 12 months    Recent Outpatient Visits           Yesterday Chronic idiopathic constipation   [facility] Gle[facility], MD   3 weeks ago Tremor   [facility]   3 months ago Type II diabetes mellitus with complication [facility])   [facility] Gle[facility], [facility]   7 months ago Neoplasm of uncertain behavior of skin   [facility] Glean Hess, MD   8 months ago Type II diabetes mellitus with complication [facility])   [facility] Gle[facility], MD       Future Appointments             In 3 weeks Glean Hess, MD [facility]             amLODipine (NORVASC) 10 MG tablet 90 tablet 0    Sig: TAKE 1 TABLET EVERY DAY     Cardiovascular:  Calcium Channel Blockers Passed - 07/08/2021 10:51 AM      Passed - Last BP in normal range    BP Readings from Last 1 Encounters:  07/07/21 122/74          Passed - Valid encounter within last 6 months    Recent Outpatient Visits           Yesterday Chronic idiopathic constipation   [facility] Glean Hess, MD   3 weeks ago Tremor   [facility] Glean Hess, MD   3 months ago Type II diabetes mellitus with complication [facility])   [facility]   7 months ago Neoplasm of uncertain behavior of skin   [facility] Glean Hess, MD   8 months ago Type II diabetes mellitus with complication [facility])   [facility] Glean Hess, MD       Future Appointments             In 3 weeks Glean Hess, MD [facility]             ramipril (ALTACE) 5 MG capsule 90 capsule 0    Sig: TAKE 1 CAPSULE EVERY DAY     Cardiovascular:  ACE Inhibitors Failed - 07/08/2021 10:51 AM      Failed - Cr in normal range and within 180 days    Creatinine, Ser  Date Value Ref Range Status  10/31/2020 1.06 (H) 0.57 - 1.00 mg/dL Final          Failed - K in normal range and within 180 days    Potassium  Date Value Ref Range Status  10/31/2020 4.4 3.5 - 5.2 mmol/L Final          Passed - Patient is not pregnant      Passed - Last BP in normal range    BP Readings from Last 1 Encounters:  07/07/21 122/74          Passed - Valid encounter within last 6 months    Recent Outpatient Visits           Yesterday Chronic idiopathic constipation   [facility] Gle[facility], [facility]   3 weeks ago Tremor   [facility], [facility]   3 months ago  Type II diabetes mellitus with complication [facility])   [facility]   7 months ago Neoplasm of uncertain behavior of skin   [facility] Gle[facility], MD   8 months ago Type II diabetes mellitus with complication [facility])   [facility] Gle[facility], [facility]       Future Appointments             In 3 weeks Glean Hess, MD [facility], [facility]             triamterene-hydrochlorothiazide (MAXZIDE-25) 37.5-25 MG tablet 90 tablet 0    Sig: TAKE 1 TABLET EVERY DAY     Cardiovascular: Diuretic Combos Failed - 07/08/2021  10:51 AM      Failed - Cr in normal range and within 360 days    Creatinine, Ser  Date Value Ref Range Status  10/31/2020 1.06 (H) 0.57 - 1.00 mg/dL Final          Failed - Ca in normal range and within 360 days    Calcium  Date Value Ref Range Status  10/31/2020 10.7 (H) 8.7 - 10.3 mg/dL Final          Passed - K in normal range and within 360 days    Potassium  Date Value Ref Range Status  10/31/2020 4.4 3.5 - 5.2 mmol/L Final          Passed - Na in normal range and within 360 days    Sodium  Date Value Ref Range Status  10/31/2020 139 134 - 144 mmol/L Final          Passed - Last BP in normal range    BP Readings from Last 1 Encounters:  07/07/21 122/74          Passed - Valid encounter within last 6 months    Recent Outpatient Visits           Yesterday Chronic idiopathic constipation   [facility] Gle[facility], [facility]   3 weeks ago Tremor   [facility] Gle[facility] Hess, MD   3 months ago Type II diabetes mellitus with complication [facility])   [facility]   7 months ago Neoplasm of uncertain behavior of skin   [facility] Gle[facility], MD   8 months ago Type II diabetes mellitus with complication [facility])   [facility] Glean Hess, MD       Future Appointments             In 3 weeks Glean Hess, MD [facility]             JARDIANCE 25 MG TABS tablet 90 tablet 0    Sig: TAKE 1 TABLET EVERY DAY     Endocrinology:  Diabetes - SGLT2 Inhibitors Failed - 07/08/2021 10:51 AM      Failed - Cr in normal range and within 360 days    Creatinine, Ser  Date Value Ref Range Status  10/31/2020 1.06 (H) 0.57 - 1.00 mg/dL Final          Failed - HBA1C is between 0 and 7.9 and within 180 days    Hemoglobin A1C  Date Value Ref Range Status  04/07/2021 8.5 (A) 4.0 - 5.6 % Final   Hgb A1c MFr Bld  Date Value Ref Range Status  04/21/2020 8.5 (H) 4.8 - 5.6  %  Final    Comment:             Prediabetes: 5.7 - 6.4          Diabetes: >6.4          Glycemic control for adults with diabetes: <7.0           Failed - AA eGFR in normal range and within 360 days    GFR calc Af Amer  Date Value Ref Range Status  04/21/2020 55 (L) >59 mL/min/1.73 Final    Comment:    **[facility] currently reports eGFR in compliance with the current**   recommendations of the [employer]. [facility] will   update reporting as new guidelines are published from the NKF-ASN   Task force.    GFR calc non Af Amer  Date Value Ref Range Status  04/21/2020 47 (L) >59 mL/min/1.73 Final   eGFR  Date Value Ref Range Status  10/31/2020 55 (L) >59 mL/min/1.73 Final          Passed - LDL in normal range and within 360 days    LDL Chol Calc (NIH)  Date Value Ref Range Status  10/31/2020 63 0 - 99 mg/dL Final          Passed - Valid encounter within last 6 months    Recent Outpatient Visits           Yesterday Chronic idiopathic constipation   [facility]   3 weeks ago Tremor   [facility] Glean Hess, MD   3 months ago Type II diabetes mellitus with complication [facility])   [facility] Gle[facility], [facility]   7 months ago Neoplasm of uncertain behavior of skin   [facility] Gle[facility], MD   8 months ago Type II diabetes mellitus with complication [facility])   [facility] Gle[facility], [facility]       Future Appointments             In 3 weeks Army Melia Jesse Sans, MD [place]

## 2021-07-15 ENCOUNTER — Other Ambulatory Visit: Payer: Self-pay | Admitting: Internal Medicine

## 2021-07-15 DIAGNOSIS — G5603 Carpal tunnel syndrome, bilateral upper limbs: Secondary | ICD-10-CM | POA: Diagnosis not present

## 2021-07-15 DIAGNOSIS — G250 Essential tremor: Secondary | ICD-10-CM | POA: Diagnosis not present

## 2021-07-15 DIAGNOSIS — M170 Bilateral primary osteoarthritis of knee: Secondary | ICD-10-CM | POA: Diagnosis not present

## 2021-07-20 ENCOUNTER — Other Ambulatory Visit: Payer: Self-pay | Admitting: Internal Medicine

## 2021-07-20 DIAGNOSIS — R21 Rash and other nonspecific skin eruption: Secondary | ICD-10-CM

## 2021-07-21 NOTE — Telephone Encounter (Signed)
Requested Prescriptions  Pending Prescriptions Disp Refills   triamcinolone cream (KENALOG) 0.1 % [Pharmacy Med Name: TRIAMCINOLONE ACETONIDE 0.1 % Cream] 80 g 0    Sig: APPLY TOPICALLY TWICE DAILY     Dermatology:  Corticosteroids Passed - 07/20/2021 10:18 AM      Passed - Valid encounter within last 12 months    Recent Outpatient Visits          2 weeks ago Chronic idiopathic constipation   [facility]   1 month ago [facility], [facility]   3 months ago Type II diabetes mellitus with complication [facility])   [facility] Gle[facility], MD   7 months ago Neoplasm of uncertain behavior of skin   [facility], MD   8 months ago Type II diabetes mellitus with complication [facility])   [facility] Gle[facility], MD      Future Appointments            In 1 week Army Melia Jesse Sans, MD [facility], [facility]

## 2021-07-29 ENCOUNTER — Other Ambulatory Visit: Payer: Self-pay | Admitting: Internal Medicine

## 2021-07-30 NOTE — Telephone Encounter (Signed)
Requested Prescriptions  Pending Prescriptions Disp Refills   JANUMET 50-1000 MG tablet [Pharmacy Med Name: JANUMET 50-1000 MG Tablet] 180 tablet 3    Sig: TAKE 1 TABLET TWICE DAILY WITH MEALS     Endocrinology:  Diabetes - Biguanide + DPP-4 Inhibitor Combos Failed - 07/29/2021 10:36 AM      Failed - HBA1C is between 0 and 7.9 and within 180 days    Hemoglobin A1C  Date Value Ref Range Status  04/07/2021 8.5 (A) 4.0 - 5.6 % Final   Hgb A1c MFr Bld  Date Value Ref Range Status  04/21/2020 8.5 (H) 4.8 - 5.6 % Final    Comment:             Prediabetes: 5.7 - 6.4          Diabetes: >6.4          Glycemic control for adults with diabetes: <7.0          Failed - Cr in normal range and within 360 days    Creatinine, Ser  Date Value Ref Range Status  10/31/2020 1.06 (H) 0.57 - 1.00 mg/dL Final         Failed - AA eGFR in normal range and within 360 days    GFR calc Af Amer  Date Value Ref Range Status  04/21/2020 55 (L) >59 mL/min/1.73 Final    Comment:    **[facility] currently reports eGFR in compliance with the current**   recommendations of the [employer]. [facility] will   update reporting as new guidelines are published from the NKF-ASN   Task force.    GFR calc non Af Amer  Date Value Ref Range Status  04/21/2020 47 (L) >59 mL/min/1.73 Final   eGFR  Date Value Ref Range Status  10/31/2020 55 (L) >59 mL/min/1.73 Final         Passed - Valid encounter within last 6 months    Recent Outpatient Visits          3 weeks ago Chronic idiopathic constipation   [facility]   1 month ago [facility]   3 months ago Type II diabetes mellitus with complication [facility])   [facility] Gle[facility]   8 months ago Neoplasm of uncertain behavior of skin   [facility] Glean Hess, MD   9 months ago Type II diabetes mellitus with complication [facility])   [facility] Gle[facility], [facility]      Future Appointments            Tomorrow Glean Hess, MD [facility] [place]

## 2021-07-31 ENCOUNTER — Encounter: Payer: Medicare PPO | Admitting: Internal Medicine

## 2021-08-11 ENCOUNTER — Ambulatory Visit: Payer: Medicare PPO | Admitting: Internal Medicine

## 2021-08-18 ENCOUNTER — Ambulatory Visit: Payer: Self-pay

## 2021-08-18 NOTE — Telephone Encounter (Signed)
°  Chief Complaint: abdominal pain Symptoms: abdominal pain, constipation, bloating, gas Frequency: 2 weeks Pertinent Negatives: NA Disposition: [] ED /[] Urgent Care (no appt availability in office) / [x] Appointment(In office/virtual)/ []  [facility]/ [] Home Care/ [] Refused Recommended Disposition /[] [facility]/ []  Follow-up with PCP Additional Notes: Pt has been taking Kaopectate for several days. She states it helps symptoms but then they come back. Advised pt to try some Miralax OTC to help with having a BM. Pt was asking if she could take Linzess. Explained that the provider would have to see pt before prescribing. Attempted to reschedule pt's appt but she preferred to keep appt for 08/21/21.     Summary: stomach disomcfort   The patient has experienced stomach discomfort for roughly two weeks   The patient has experienced significant bloating, constipation and stomach discomfort   The patient has previously taken Tums and Kaopectate but found them ineffective   Please contact further       Reason for Disposition  [1] MODERATE pain (e.g., interferes with normal activities) AND [2] pain comes and goes (cramps) AND [3] present > 24 hours  (Exception: pain with Vomiting or Diarrhea - see that Guideline)  Answer Assessment - Initial Assessment Questions 1. LOCATION: "Where does it hurt?"      Abdominal and feels like knot in side 3. ONSET: "When did the pain begin?" (e.g., minutes, hours or days ago)      2 weeks 5. PATTERN "Does the pain come and go, or is it constant?"    - If constant: "Is it getting better, staying the same, or worsening?"      (Note: Constant means the pain never goes away completely; most serious pain is constant and it progresses)     - If intermittent: "How long does it last?" "Do you have pain now?"     (Note: Intermittent means the pain goes away completely between bouts)     Comes and goes 6. SEVERITY: "How bad is the pain?"  (e.g.,  Scale 1-10; mild, moderate, or severe)   - MILD (1-3): doesn't interfere with normal activities, abdomen soft and not tender to touch    - MODERATE (4-7): interferes with normal activities or awakens from sleep, abdomen tender to touch    - SEVERE (8-10): excruciating pain, doubled over, unable to do any normal activities      7 9. RELIEVING/AGGRAVATING FACTORS: "What makes it better or worse?" (e.g., movement, antacids, bowel movement)     Kaopeptate 10. OTHER SYMPTOMS: "Do you have any other symptoms?" (e.g., back pain, diarrhea, fever, urination pain, vomiting)       Constipation, bloating  Protocols used: Abdominal Pain - Female-A-AH

## 2021-08-21 ENCOUNTER — Ambulatory Visit: Payer: Medicare PPO | Admitting: Internal Medicine

## 2021-08-26 ENCOUNTER — Other Ambulatory Visit: Payer: Self-pay

## 2021-08-26 NOTE — Patient Outreach (Signed)
[facility]) Care Management  08/26/2021  Sheila Sandoval Nov 22, 1945 [id number]   Telephone Assessment    Successful outreach call to patient . She reports things are going well. She was having some knee pain but went and had cortisone shot and sxs resolved. Appetite remains good. Patient trying to eat healthier. Wgt stable. Blood sugars controlled. She continues to be independent with ADLs/IADLs. Denies any recent falls. No RN CM needs or concerns identified during this call.    Medications Reviewed Today     Reviewed by Hayden Pedro, RN (Registered Nurse) on 08/26/21 at 1125  Med List Status: <None>   Medication Order Taking? Sig Documenting Provider Last Dose Status Informant  ACCU-CHEK GUIDE test strip [id number] No USE AS INSTRUCTED Glean Hess, MD Taking Active   Accu-Chek Softclix Lancets lancets [id number]  USE AS DIRECTED Glean Hess, MD  Active   amLODipine (NORVASC) 10 MG tablet [id number]  TAKE 1 TABLET EVERY DAY Glean Hess, MD  Active   ammonium lactate (AMLACTIN) 12 % cream [id number] No APPLY TOPICALLY AS NEEDED FOR DRY SKIN. Glean Hess, MD Taking Active   aspirin 81 MG tablet [id number] No Take 1 tablet by mouth daily. [provider] Taking Active            Med Note Romana Juniper Dec 10, 2019 11:31 AM)    baclofen (LIORESAL) 10 MG tablet [id number]  TAKE 1 TABLET THREE TIMES DAILY Glean Hess, MD  Active   blood glucose meter kit and supplies [id number] No Dispense based on patient and insurance preference. Use up to four times daily as directed. (FOR ICD-10 E10.9, E11.9). Glean Hess, MD Taking Active   Cholecalciferol 2000 units CAPS [id number] No Take 1 capsule by mouth daily. [provider] Taking Active   cyanocobalamin ((VITAMIN B-12)) injection 1,000 mcg [id number]   Juline Patch, MD  Active   diclofenac Sodium (VOLTAREN) 1 % GEL [id number] No APPLY 2 GRAMS TOPICALLY 4  (FOUR) TIMES DAILY. Glean Hess, MD Taking Active   ezetimibe (ZETIA) 10 MG tablet [id number] No TAKE 1 TABLET EVERY DAY Glean Hess, MD Taking Active   Ferrous Gluconate 325 (36 FE) MG TABS [id number] No Take by mouth. [provider] Taking Active            Med Note Winfield Cunas, ELISABETH A   Wed Sep 07, 2017 11:13 AM) Taking 1 tablet by mouth once daily  fluticasone (FLONASE) 50 MCG/ACT nasal spray [id number]  USE 2 SPRAYS INTO BOTH NOSTRILS DAILY. Glean Hess, MD  Active   glimepiride (AMARYL) 2 MG tablet [id number]  TAKE 1 TABLET EVERY DAY WITH BREAKFAST Glean Hess, MD  Active   JANUMET 50-1000 MG tablet [id number]  TAKE 1 TABLET TWICE DAILY WITH MEALS Glean Hess, MD  Active   JARDIANCE 25 MG TABS tablet [id number]  TAKE 1 TABLET EVERY DAY Glean Hess, MD  Active   nabumetone (RELAFEN) 750 MG tablet [id number] No Take 1 tablet (750 mg total) by mouth at bedtime as needed. Glean Hess, MD Taking Active   ramipril (ALTACE) 5 MG capsule [id number]  TAKE 1 CAPSULE EVERY DAY Glean Hess, MD  Active   rosuvastatin (CRESTOR) 5 MG tablet [id number] No TAKE 1 TABLET EVERY DAY Glean Hess, MD Taking Active   triamcinolone cream (KENALOG) 0.1 % [id number]  APPLY TOPICALLY TWICE DAILY Glean Hess, MD  Active  triamterene-hydrochlorothiazide (GHWEXHB-71) 37.5-25 MG tablet [id number]  TAKE 1 TABLET EVERY DAY Glean Hess, MD  Active            Fall Risk 04/07/2021 06/01/2021 06/16/2021 07/07/2021 08/26/2021  Falls in the past year? 0 0 0 0 0  Was there an injury with Fall? 0 0 0 0 0  Was there an injury with Fall? - - - - -  Fall Risk Category Calculator 0 0 0 0 0  Fall Risk Category Low Low Low Low Low  Patient Fall Risk Level Low fall risk Low fall risk Low fall risk Low fall risk Low fall risk  Patient at Risk for Falls Due to No Fall Risks History of fall(s);Medication side effect No Fall Risks No Fall Risks History of  fall(s);Medication side effect  Patient at Risk for Falls Due to - - - - -  Fall risk Follow up Falls evaluation completed Falls evaluation completed Falls evaluation completed Falls evaluation completed Falls evaluation completed    Depression screen [facility] 2/9 08/26/2021 07/07/2021 06/16/2021 04/07/2021 01/05/2021  Decreased Interest 0 0 1 0 0  Down, Depressed, Hopeless 0 0 0 0 0  PHQ - 2 Score 0 0 1 0 0  Altered sleeping - 0 0 0 0  Tired, decreased energy - 0 0 0 0  Change in appetite - 0 0 0 0  Feeling bad or failure about yourself  - 0 0 0 0  Trouble concentrating - 0 0 0 0  Moving slowly or fidgety/restless - 0 0 0 0  Suicidal thoughts - 0 0 0 0  PHQ-9 Score - 0 1 0 0  Difficult doing work/chores - Not difficult at all Not difficult at all Not difficult at all Not difficult at all  Some recent data might be hidden    SDOH Screenings   Alcohol Screen: Low Risk    Last Alcohol Screening Score (AUDIT): 0  Depression (PHQ2-9): Low Risk    PHQ-2 Score: 0  Financial Resource Strain: Medium Risk   Difficulty of Paying Living Expenses: Somewhat hard  Food Insecurity: No Food Insecurity   Worried About Charity fundraiser in the Last Year: Never true   Ran Out of Food in the Last Year: Never true  Housing: Low Risk    Last Housing Risk Score: 0  Physical Activity: Insufficiently Active   Days of Exercise per Week: 3 days   Minutes of Exercise per Session: 20 min  Social Connections: Moderately Integrated   Frequency of Communication with Friends and Family: More than three times a week   Frequency of Social Gatherings with Friends and Family: More than three times a week   Attends Religious Services: More than 4 times per year   Active Member of [employer] or Organizations: Yes   Attends Archivist Meetings: More than 4 times per year   Marital Status: Widowed  Stress: No Stress Concern Present   Feeling of Stress : Not at all  Tobacco Use: Low Risk    Smoking Tobacco Use:  Never   Smokeless Tobacco Use: Never   Passive Exposure: Not on file  Transportation Needs: No Transportation Needs   Lack of Transportation (Medical): No   Lack of Transportation (Non-Medical): No    Care Plan : RN Care Manager POC  Updates made by Hayden Pedro, RN since 08/26/2021 12:00 AM     Problem: Chronic Disease Mgmt of Chronic Condition(DM,HTN)   Priority: High  Long-Range Goal: Development of POC for Mgmt of Chronic Conditions(DM,HTN)   Start Date: 06/01/2021  Expected End Date: 06/01/2022  This Visit's Progress: On track  Priority: High  Note:    Current Barriers:  Chronic Disease Management support and education needs related to HTN and DMII  RNCM Clinical Goal(s):  Patient will verbalize basic understanding of  HTN and DMII disease process and self health management plan for chronic conditions take all medications exactly as prescribed and will call provider for medication related questions demonstrate Improved adherence to prescribed treatment plan for HTN and DMII as evidenced by lowering of A1C level continue to work with RN Care Manager to address care management and care coordination needs related to  HTN and DMII through collaboration with RN Care manager, provider, and care team.   Interventions: POC sent to PCP upon initial assessment, quarterly and with any changes in patient's conditions Inter-disciplinary care team collaboration (see longitudinal plan of care) Evaluation of current treatment plan related to  self management and patient's adherence to plan as established by provider   Hypertension Interventions:  (Status:  Goal on track:  Yes.) Long Term Goal Last practice recorded BP readings:  BP Readings from Last 3 Encounters:  07/07/21 122/74  06/16/21 130/78  04/07/21 122/72   Most recent eGFR/CrCl:  Lab Results  Component Value Date   EGFR 55 (L) 10/31/2020    No components found for: CRCL  Evaluation of current treatment  plan related to hypertension self management and patient's adherence to plan as established by provider; Reviewed medications with patient and discussed importance of compliance; Advised patient, providing education and rationale, to monitor blood pressure daily and record, calling PCP for findings outside established parameters;  Reviewed scheduled/upcoming provider appointments including:   Diabetes Interventions:  (Status:  Goal on track:  Yes.) Long Term Goal Assessed patient's understanding of A1c goal: <7%  Diabetes Interventions: Assessed patient's understanding of A1c goal: <7%-Patient's A1C level has slightly increased from 8.0 (April 2022 to 8.5-Sept 2022). She reports cbgs have been ranging in the 120-170s pre and post prandial.  Provided education to patient about basic DM disease process; Reviewed medications with patient and discussed importance of medication adherence; Discussed plans with patient for ongoing care management follow up and provided patient with direct contact information for care management team; Reviewed scheduled/upcoming provider appointments including: annual wellness visit -scheduled for Jan 2023; Reviewed cbg monitoring log values with pt and discussed ways to manage cbgs Lab Results  Component Value Date   HGBA1C 8.5 (A) 04/07/2021  08/26/2021-Patient reports she is adhering to diabetic diet although it gets difficult at times. Wgt stable. Blood sugars ranging in the 120s-170s.  Patient Goals/Self-Care Activities: Patient will self administer medications as prescribed Patient will attend all scheduled provider appointments Paitent will continue to lmonitor cbgs and BP in the home and alert MD of any abnormal values.  Follow Up Plan:  Telephone follow up appointment with care management team member scheduled for:  quarterly call within the month of April. The patient has been provided with contact information for the care management team and has been  advised to call with any health related questions or concerns.          Plan: RN CM discussed with patient next outreach within the month of April. Patient agrees to care plan and follow up. RN CM will send quarterly update to PCP.   [name], RN,BSN,CCM [facility] Telephonic Care Management Coordinator Direct Phone: [phone] Toll  Free: [phone] Fax: [phone]

## 2021-09-01 ENCOUNTER — Encounter: Payer: Self-pay | Admitting: Internal Medicine

## 2021-09-01 ENCOUNTER — Ambulatory Visit (INDEPENDENT_AMBULATORY_CARE_PROVIDER_SITE_OTHER): Payer: Medicare PPO | Admitting: Internal Medicine

## 2021-09-01 ENCOUNTER — Other Ambulatory Visit: Payer: Self-pay

## 2021-09-01 VITALS — BP 124/78 | HR 64 | Ht 64.0 in | Wt 154.8 lb

## 2021-09-01 DIAGNOSIS — I10 Essential (primary) hypertension: Secondary | ICD-10-CM | POA: Diagnosis not present

## 2021-09-01 DIAGNOSIS — E118 Type 2 diabetes mellitus with unspecified complications: Secondary | ICD-10-CM

## 2021-09-01 DIAGNOSIS — Z1231 Encounter for screening mammogram for malignant neoplasm of breast: Secondary | ICD-10-CM

## 2021-09-01 DIAGNOSIS — Z0000 Encounter for general adult medical examination without abnormal findings: Secondary | ICD-10-CM

## 2021-09-01 DIAGNOSIS — E1169 Type 2 diabetes mellitus with other specified complication: Secondary | ICD-10-CM | POA: Diagnosis not present

## 2021-09-01 DIAGNOSIS — R079 Chest pain, unspecified: Secondary | ICD-10-CM

## 2021-09-01 DIAGNOSIS — N1831 Chronic kidney disease, stage 3a: Secondary | ICD-10-CM | POA: Diagnosis not present

## 2021-09-01 DIAGNOSIS — E785 Hyperlipidemia, unspecified: Secondary | ICD-10-CM

## 2021-09-01 DIAGNOSIS — E538 Deficiency of other specified B group vitamins: Secondary | ICD-10-CM | POA: Diagnosis not present

## 2021-09-01 LAB — POCT URINALYSIS DIPSTICK
Bilirubin, UA: NEGATIVE
Blood, UA: NEGATIVE
Glucose, UA: POSITIVE — AB
Ketones, UA: NEGATIVE
Leukocytes, UA: NEGATIVE
Nitrite, UA: NEGATIVE
Protein, UA: NEGATIVE
Spec Grav, UA: 1.01 (ref 1.010–1.025)
Urobilinogen, UA: 0.2 E.U./dL
pH, UA: 6.5 (ref 5.0–8.0)

## 2021-09-01 MED ORDER — CYANOCOBALAMIN 1000 MCG/ML IJ SOLN
1000.0000 ug | Freq: Once | INTRAMUSCULAR | Status: AC
  Administered 2021-09-01: 1000 ug via INTRAMUSCULAR

## 2021-09-01 MED ORDER — ROSUVASTATIN CALCIUM 10 MG PO TABS: 10.0000 mg | ORAL_TABLET | Freq: Every day | ORAL | 1 refills | Status: AC

## 2021-09-01 NOTE — Patient Instructions (Signed)
Stop Zetia when you start the higher dose Crestor 10 mg.

## 2021-09-01 NOTE — Progress Notes (Signed)
Date:  09/01/2021   Name:  Sheila Sandoval   DOB:  16-Mar-1946   MRN:  [id number]   Chief Complaint: Annual Exam (Breast Exam. B12 Shot. UA), Arm Pain (Left Arm Pain. Moved from left arm to Right Arm last night while laying down. No pain now. No chest pain or SOB.), and Diabetes (MICRO, Foot Exam. ) Sheila Sandoval is a 76 y.o. female who presents today for her Complete Annual Exam. She feels well. She reports exercising - not at this time. She reports she is sleeping fairly well. Breast complaints - none.  Mammogram: 10/2020 [facility] DEXA: 05/2017 normal Pap smear: discontinued Colonoscopy: 08/2015 repeat 10 yrs  Immunization History  Administered Date(s) Administered   Fluad Quad(high Dose 65+) 05/01/2019, 04/21/2020, 04/07/2021   Influenza, High Dose Seasonal PF 04/07/2018   Influenza,inj,Quad PF,6+ Mos 07/25/2013, 04/11/2015, 06/04/2016, 08/09/2016, 05/04/2017   Influenza,inj,quad, With Preservative 03/27/2019   Influenza-Unspecified 08/20/2014   Moderna Sars-Covid-2 Vaccination 08/30/2019, 09/28/2019, 05/19/2020, 12/03/2020   Pneumococcal Conjugate-13 08/02/2014   Pneumococcal Polysaccharide-23 09/04/2010, 09/17/2016   Pneumococcal-Unspecified 07/26/2000, 09/04/2010, 03/27/2019   Tdap 09/04/2010   Zoster Recombinat (Shingrix) 10/13/2017, 01/05/2018    Hypertension This is a chronic problem. The problem is controlled. Associated symptoms include chest pain. Pertinent negatives include no headaches, palpitations or shortness of breath. Risk factors for coronary artery disease include diabetes mellitus. Past treatments include diuretics, calcium channel blockers and ACE inhibitors. Hypertensive end-organ damage includes kidney disease. There is no history of CAD/MI or CVA.  Diabetes She presents for her follow-up diabetic visit. She has type 2 diabetes mellitus. Pertinent negatives for hypoglycemia include no dizziness, headaches, nervousness/anxiousness or tremors. Associated  symptoms include chest pain. Pertinent negatives for diabetes include no fatigue, no polydipsia and no polyuria. Pertinent negatives for diabetic complications include no CVA. Current diabetic treatments: jardiance, janumet, glimepiride. She is compliant with treatment all of the time. She is following a generally healthy diet. Her home blood glucose trend is increasing steadily. Her breakfast blood glucose is taken between 6-7 am. Her breakfast blood glucose range is generally 110-130 mg/dl. An ACE inhibitor/angiotensin II receptor blocker is being taken. Eye exam is current.  Hyperlipidemia This is a chronic problem. The problem is controlled. Associated symptoms include chest pain. Pertinent negatives include no shortness of breath. Current antihyperlipidemic treatment includes statins. The current treatment provides significant improvement of lipids.  Chest Pain  This is a new problem. The current episode started yesterday. The problem has been resolved. The pain is mild. The quality of the pain is described as pressure (in the left arm). Pertinent negatives include no abdominal pain, cough, dizziness, fever, headaches, palpitations, shortness of breath or vomiting.  Her past medical history is significant for hyperlipidemia and hypertension.   Lab Results  Component Value Date   NA 139 10/31/2020   K 4.4 10/31/2020   CO2 23 10/31/2020   GLUCOSE 173 (H) 10/31/2020   BUN 20 10/31/2020   CREATININE 1.06 (H) 10/31/2020   CALCIUM 10.7 (H) 10/31/2020   EGFR 55 (L) 10/31/2020   GFRNONAA 47 (L) 04/21/2020   Lab Results  Component Value Date   CHOL 139 10/31/2020   HDL 57 10/31/2020   LDLCALC 63 10/31/2020   TRIG 101 10/31/2020   CHOLHDL 2.4 10/31/2020   Lab Results  Component Value Date   TSH 1.380 10/31/2020   Lab Results  Component Value Date   HGBA1C 8.5 (A) 04/07/2021   Lab Results  Component Value Date   WBC  5.6 10/31/2020   HGB 13.3 10/31/2020   HCT 42.9 10/31/2020   MCV 73  (L) 10/31/2020   PLT 258 10/31/2020   Lab Results  Component Value Date   ALT 20 10/31/2020   AST 15 10/31/2020   ALKPHOS 88 10/31/2020   BILITOT 0.4 10/31/2020   Lab Results  Component Value Date   VD25OH 41.1 04/21/2020     Review of Systems  Constitutional:  Negative for chills, fatigue and fever.  HENT:  Negative for congestion, hearing loss, tinnitus, trouble swallowing and voice change.   Eyes:  Negative for discharge, redness and visual disturbance.  Respiratory:  Negative for cough, chest tightness, shortness of breath and wheezing.   Cardiovascular:  Positive for chest pain. Negative for palpitations and leg swelling.  Gastrointestinal:  Negative for abdominal pain, constipation, diarrhea and vomiting.  Endocrine: Negative for polydipsia and polyuria.  Genitourinary:  Negative for dysuria, frequency, genital sores, vaginal bleeding and vaginal discharge.  Musculoskeletal:  Negative for arthralgias, gait problem and joint swelling.  Skin:  Negative for color change and rash.  Neurological:  Negative for dizziness, tremors, light-headedness and headaches.  Hematological:  Negative for adenopathy. Bruises/bleeds easily.  Psychiatric/Behavioral:  Negative for dysphoric mood and sleep disturbance. The patient is not nervous/anxious.    Patient Active Problem List   Diagnosis Date Noted   Essential tremor 07/15/2021   Stage 3a chronic kidney disease ([place]) 04/21/2020   Type II diabetes mellitus with complication ([place]) 86/75/4492   Anatomical narrow angle glaucoma with borderline intraocular pressure 07/15/2017   Vitamin D deficiency 03/11/2017   Hypercalcemia 10/02/2016   Pernicious anemia 04/11/2015   Carpal tunnel syndrome 01/14/2015   Hyperlipidemia associated with type 2 diabetes mellitus ([place]) 01/14/2015   Essential hypertension 01/14/2015   Climacteric 01/14/2015   Muscle fatigue 01/14/2015   Arthritis of hand, degenerative 01/14/2015   GERD (gastroesophageal  reflux disease) 10/29/2011   Spondylolisthesis 10/29/2011    Allergies  Allergen Reactions   Atorvastatin     chest tightness   Tramadol Hcl Nausea And Vomiting    Past Surgical History:  Procedure Laterality Date   [place] / CSF LEAK  2002    Social History   Tobacco Use   Smoking status: Never   Smokeless tobacco: Never   Tobacco comments:    smoking cessation material not required  Vaping Use   Vaping Use: Never used  Substance Use Topics   Alcohol use: No    Alcohol/week: 0.0 standard drinks   Drug use: No     Medication list has been reviewed and updated.  Current Meds  Medication Sig   ACCU-CHEK GUIDE test strip USE AS INSTRUCTED   Accu-Chek Softclix Lancets lancets USE AS DIRECTED   amLODipine (NORVASC) 10 MG tablet TAKE 1 TABLET EVERY DAY   ammonium lactate (AMLACTIN) 12 % cream APPLY TOPICALLY AS NEEDED FOR DRY SKIN.   aspirin 81 MG tablet Take 1 tablet by mouth daily.   baclofen (LIORESAL) 10 MG tablet TAKE 1 TABLET THREE TIMES DAILY   Cholecalciferol 2000 units CAPS Take 1 capsule by mouth daily.   diclofenac Sodium (VOLTAREN) 1 % GEL APPLY 2 GRAMS TOPICALLY 4 (FOUR) TIMES DAILY.   Ferrous Gluconate 325 (36 FE) MG TABS Take by mouth.   fluticasone (FLONASE) 50 MCG/ACT nasal spray USE 2 SPRAYS INTO BOTH NOSTRILS DAILY.   gabapentin (NEURONTIN) 100 MG capsule Take 100 mg by mouth daily.  glimepiride (AMARYL) 2 MG tablet TAKE 1 TABLET EVERY DAY WITH BREAKFAST   JANUMET 50-1000 MG tablet TAKE 1 TABLET TWICE DAILY WITH MEALS   JARDIANCE 25 MG TABS tablet TAKE 1 TABLET EVERY DAY   nabumetone (RELAFEN) 750 MG tablet Take 1 tablet (750 mg total) by mouth at bedtime as needed.   ramipril (ALTACE) 5 MG capsule TAKE 1 CAPSULE EVERY DAY   triamcinolone cream (KENALOG) 0.1 % APPLY TOPICALLY TWICE DAILY   triamterene-hydrochlorothiazide (MAXZIDE-25) 37.5-25 MG tablet TAKE 1 TABLET EVERY DAY   [DISCONTINUED]  ezetimibe (ZETIA) 10 MG tablet TAKE 1 TABLET EVERY DAY   [DISCONTINUED] rosuvastatin (CRESTOR) 5 MG tablet TAKE 1 TABLET EVERY DAY   Current Facility-Administered Medications for the 09/01/21 encounter (Office Visit) with Glean Hess, MD  Medication   cyanocobalamin ((VITAMIN B-12)) injection 1,000 mcg    PHQ 2/9 Scores 09/01/2021 08/26/2021 07/07/2021 06/16/2021  PHQ - 2 Score 2 0 0 1  PHQ- 9 Score 4 - 0 1    GAD 7 : Generalized Anxiety Score 09/01/2021 07/07/2021 06/16/2021 04/07/2021  Nervous, Anxious, on Edge 0 0 0 0  Control/stop worrying 0 0 0 0  Worry too much - different things 0 0 0 0  Trouble relaxing 0 3 0 0  Restless 0 0 0 0  Easily annoyed or irritable 0 3 0 0  Afraid - awful might happen 0 3 0 0  Total GAD 7 Score 0 9 0 0  Anxiety Difficulty Not difficult at all Not difficult at all Not difficult at all -    BP Readings from Last 3 Encounters:  09/01/21 124/78  07/07/21 122/74  06/16/21 130/78    Physical Exam Vitals and nursing note reviewed.  Constitutional:      General: She is not in acute distress.    Appearance: She is well-developed.  HENT:     Head: Normocephalic and atraumatic.     Right Ear: Tympanic membrane and ear canal normal.     Left Ear: Tympanic membrane and ear canal normal.     Nose:     Right Sinus: No maxillary sinus tenderness.     Left Sinus: No maxillary sinus tenderness.  Eyes:     General: No scleral icterus.       Right eye: No discharge.        Left eye: No discharge.     Conjunctiva/sclera: Conjunctivae normal.  Neck:     Thyroid: No thyromegaly.     Vascular: No carotid bruit.  Cardiovascular:     Rate and Rhythm: Normal rate and regular rhythm.     Pulses: Normal pulses.     Heart sounds: Normal heart sounds.  Pulmonary:     Effort: Pulmonary effort is normal. No respiratory distress.     Breath sounds: No wheezing.  Chest:  Breasts:    Right: No mass, nipple discharge, skin change or tenderness.     Left: No  mass, nipple discharge, skin change or tenderness.  Abdominal:     General: Bowel sounds are normal.     Palpations: Abdomen is soft.     Tenderness: There is no abdominal tenderness.  Musculoskeletal:     Cervical back: Normal range of motion. No erythema.     Right lower leg: No edema.     Left lower leg: No edema.  Lymphadenopathy:     Cervical: No cervical adenopathy.  Skin:    General: Skin is warm and dry.     Findings:  No rash.  Neurological:     Mental Status: She is alert and oriented to person, place, and time.     Cranial Nerves: No cranial nerve deficit.     Sensory: No sensory deficit.     Deep Tendon Reflexes: Reflexes are normal and symmetric.  Psychiatric:        Attention and Perception: Attention normal.        Mood and Affect: Mood normal.    Wt Readings from Last 3 Encounters:  09/01/21 154 lb 12.8 oz (70.2 kg)  07/07/21 161 lb (73 kg)  06/16/21 158 lb 6.4 oz (71.8 kg)    BP 124/78    Pulse 64    Ht _0  (1.626 m)    Wt 154 lb 12.8 oz (70.2 kg)    SpO2 98%    BMI 26.57 kg/m   Assessment and Plan: 1. Annual physical exam Normal exam. She has lost some weight with diet changes  2. Chest pain, unspecified type EKG unchanged from 2018 However, she has multiple risk factors so will refer back to cardiology - EKG 12-Lead - SR @ 61, T-wave inversions V1 and V2 unchanged - Ambulatory referral to Cardiology  3. Encounter for screening mammogram for breast cancer Schedule at [facility]  4. Essential hypertension Clinically stable exam with well controlled BP. Tolerating medications without side effects at this time. Pt to continue current regimen and low sodium diet; benefits of regular exercise as able discussed. - CBC with Differential/Platelet - TSH - POCT urinalysis dipstick - Ambulatory referral to Cardiology  5. Type II diabetes mellitus with complication (HCC) BS have been higher with last A1C 8.5. She is on four meds - will need to start insulin  if A1C is not < 8 - Comprehensive metabolic panel - Hemoglobin A1c - Microalbumin / creatinine urine ratio - Ambulatory referral to Cardiology  6. Hyperlipidemia associated with type 2 diabetes mellitus (HCC) Will increase dose of Crestor and d/c zetia to reduce pill burden - Lipid panel - rosuvastatin (CRESTOR) 10 MG tablet; Take 1 tablet (10 mg total) by mouth daily.  Dispense: 90 tablet; Refill: 1 - Ambulatory referral to Cardiology  7. Stage 3a chronic kidney disease (HCC) Continue to monitor Limit NSAIDS - Comprehensive metabolic panel  8. B12 deficiency - cyanocobalamin ((VITAMIN B-12)) injection 1,000 mcg   Partially dictated using Editor, commissioning. Any errors are unintentional.  Halina Maidens, MD [facility]  09/01/2021

## 2021-09-02 ENCOUNTER — Other Ambulatory Visit: Payer: Self-pay | Admitting: Internal Medicine

## 2021-09-02 DIAGNOSIS — M62838 Other muscle spasm: Secondary | ICD-10-CM

## 2021-09-02 LAB — CBC WITH DIFFERENTIAL/PLATELET
Basophils Absolute: 0 10*3/uL (ref 0.0–0.2)
Basos: 1 %
EOS (ABSOLUTE): 0.1 10*3/uL (ref 0.0–0.4)
Eos: 1 %
Hematocrit: 40.5 % (ref 34.0–46.6)
Hemoglobin: 12.7 g/dL (ref 11.1–15.9)
Immature Grans (Abs): 0 10*3/uL (ref 0.0–0.1)
Immature Granulocytes: 1 %
Lymphocytes Absolute: 2.2 10*3/uL (ref 0.7–3.1)
Lymphs: 34 %
MCH: 22.6 pg — ABNORMAL LOW (ref 26.6–33.0)
MCHC: 31.4 g/dL — ABNORMAL LOW (ref 31.5–35.7)
MCV: 72 fL — ABNORMAL LOW (ref 79–97)
Monocytes Absolute: 0.4 10*3/uL (ref 0.1–0.9)
Monocytes: 6 %
Neutrophils Absolute: 3.7 10*3/uL (ref 1.4–7.0)
Neutrophils: 57 %
Platelets: 296 10*3/uL (ref 150–450)
RBC: 5.62 x10E6/uL — ABNORMAL HIGH (ref 3.77–5.28)
RDW: 15.3 % (ref 11.7–15.4)
WBC: 6.4 10*3/uL (ref 3.4–10.8)

## 2021-09-02 LAB — HEMOGLOBIN A1C
Est. average glucose Bld gHb Est-mCnc: 209 mg/dL
Hgb A1c MFr Bld: 8.9 % — ABNORMAL HIGH (ref 4.8–5.6)

## 2021-09-02 LAB — LIPID PANEL
Chol/HDL Ratio: 2.1 ratio (ref 0.0–4.4)
Cholesterol, Total: 116 mg/dL (ref 100–199)
HDL: 54 mg/dL (ref >39)
LDL Chol Calc (NIH): 50 mg/dL (ref 0–99)
Triglycerides: 54 mg/dL (ref 0–149)
VLDL Cholesterol Cal: 12 mg/dL (ref 5–40)

## 2021-09-02 LAB — MICROALBUMIN / CREATININE URINE RATIO
Creatinine, Urine: 44.4 mg/dL
Microalb/Creat Ratio: 11 mg/g creat (ref 0–29)
Microalbumin, Urine: 5 ug/mL

## 2021-09-02 LAB — COMPREHENSIVE METABOLIC PANEL
ALT: 17 IU/L (ref 0–32)
AST: 15 IU/L (ref 0–40)
Albumin/Globulin Ratio: 2.1 (ref 1.2–2.2)
Albumin: 4.6 g/dL (ref 3.7–4.7)
Alkaline Phosphatase: 81 IU/L (ref 44–121)
BUN/Creatinine Ratio: 17 (ref 12–28)
BUN: 16 mg/dL (ref 8–27)
Bilirubin Total: 0.4 mg/dL (ref 0.0–1.2)
CO2: 25 mmol/L (ref 20–29)
Calcium: 10.4 mg/dL — ABNORMAL HIGH (ref 8.7–10.3)
Chloride: 102 mmol/L (ref 96–106)
Creatinine, Ser: 0.96 mg/dL (ref 0.57–1.00)
Globulin, Total: 2.2 g/dL (ref 1.5–4.5)
Glucose: 151 mg/dL — ABNORMAL HIGH (ref 70–99)
Potassium: 4.4 mmol/L (ref 3.5–5.2)
Sodium: 140 mmol/L (ref 134–144)
Total Protein: 6.8 g/dL (ref 6.0–8.5)
eGFR: 62 mL/min/{1.73_m2} (ref >59)

## 2021-09-02 LAB — TSH: TSH: 1.24 u[IU]/mL (ref 0.450–4.500)

## 2021-09-02 NOTE — Telephone Encounter (Signed)
Requested Prescriptions  Pending Prescriptions Disp Refills   baclofen (LIORESAL) 10 MG tablet [Pharmacy Med Name: BACLOFEN 10 MG Tablet] 180 tablet 1    Sig: TAKE 1 TABLET THREE TIMES DAILY     Analgesics:  Muscle Relaxants - baclofen Passed - 09/02/2021 10:47 AM      Passed - Cr in normal range and within 180 days    Creatinine, Ser  Date Value Ref Range Status  09/01/2021 0.96 0.57 - 1.00 mg/dL Final         Passed - eGFR is 30 or above and within 180 days    GFR calc Af Amer  Date Value Ref Range Status  04/21/2020 55 (L) >59 mL/min/1.73 Final    Comment:    **[facility] currently reports eGFR in compliance with the current**   recommendations of the [employer]. [facility] will   update reporting as new guidelines are published from the NKF-ASN   Task force.    GFR calc non Af Amer  Date Value Ref Range Status  04/21/2020 47 (L) >59 mL/min/1.73 Final   eGFR  Date Value Ref Range Status  09/01/2021 62 >59 mL/min/1.73 Final         Passed - Valid encounter within last 6 months    Recent Outpatient Visits          Yesterday Annual physical exam   [facility] Glean Hess, MD   1 month ago Chronic idiopathic constipation   [facility] Glean Hess, MD   2 months ago Tremor   [facility] Glean Hess, MD   4 months ago Type II diabetes mellitus with complication [facility])   [facility] Glean Hess, MD   9 months ago Neoplasm of uncertain behavior of skin   [facility] Glean Hess, MD      Future Appointments            In 3 months Army Melia Jesse Sans, MD [facility], [facility]

## 2021-09-09 DIAGNOSIS — G9601 Cranial cerebrospinal fluid leak, spontaneous: Secondary | ICD-10-CM | POA: Diagnosis not present

## 2021-09-09 DIAGNOSIS — G9600 Cerebrospinal fluid leak, unspecified: Secondary | ICD-10-CM | POA: Diagnosis not present

## 2021-09-09 DIAGNOSIS — J310 Chronic rhinitis: Secondary | ICD-10-CM | POA: Diagnosis not present

## 2021-09-10 DIAGNOSIS — Z79899 Other long term (current) drug therapy: Secondary | ICD-10-CM | POA: Diagnosis not present

## 2021-09-10 DIAGNOSIS — G9600 Cerebrospinal fluid leak, unspecified: Secondary | ICD-10-CM | POA: Diagnosis not present

## 2021-09-10 DIAGNOSIS — E785 Hyperlipidemia, unspecified: Secondary | ICD-10-CM | POA: Diagnosis not present

## 2021-09-10 DIAGNOSIS — R519 Headache, unspecified: Secondary | ICD-10-CM | POA: Diagnosis not present

## 2021-09-10 DIAGNOSIS — E1140 Type 2 diabetes mellitus with diabetic neuropathy, unspecified: Secondary | ICD-10-CM | POA: Diagnosis not present

## 2021-09-10 DIAGNOSIS — Z5181 Encounter for therapeutic drug level monitoring: Secondary | ICD-10-CM | POA: Diagnosis not present

## 2021-09-10 DIAGNOSIS — I10 Essential (primary) hypertension: Secondary | ICD-10-CM | POA: Diagnosis not present

## 2021-09-10 DIAGNOSIS — Z87891 Personal history of nicotine dependence: Secondary | ICD-10-CM | POA: Diagnosis not present

## 2021-09-10 DIAGNOSIS — Z8669 Personal history of other diseases of the nervous system and sense organs: Secondary | ICD-10-CM | POA: Diagnosis not present

## 2021-09-10 DIAGNOSIS — J3489 Other specified disorders of nose and nasal sinuses: Secondary | ICD-10-CM | POA: Diagnosis not present

## 2021-09-14 ENCOUNTER — Other Ambulatory Visit: Payer: Self-pay | Admitting: Internal Medicine

## 2021-09-14 DIAGNOSIS — R21 Rash and other nonspecific skin eruption: Secondary | ICD-10-CM

## 2021-09-15 NOTE — Telephone Encounter (Signed)
Requested medication (s) are due for refill today - unsure  Requested medication (s) are on the active medication list-yes  Future visit scheduled -yes  Last refill: 07/21/21 80g  Notes to clinic: Request RF: non delegated Rx  Requested Prescriptions  Pending Prescriptions Disp Refills   triamcinolone cream (KENALOG) 0.1 % [Pharmacy Med Name: TRIAMCINOLONE ACETONIDE 0.1 % Cream] 80 g 0    Sig: APPLY TOPICALLY TWICE DAILY     Not Delegated - Dermatology:  Corticosteroids Failed - 09/14/2021 10:26 AM      Failed - This refill cannot be delegated      Passed - Valid encounter within last 12 months    Recent Outpatient Visits           2 weeks ago Annual physical exam   [facility] Glean Hess, MD   2 months ago Chronic idiopathic constipation   [facility] Glean Hess, MD   3 months ago Tremor   [facility] Glean Hess, MD   5 months ago Type II diabetes mellitus with complication [facility])   [facility] Glean Hess, MD   9 months ago Neoplasm of uncertain behavior of skin   [facility] Glean Hess, MD       Future Appointments             In 1 month Army Melia Jesse Sans, MD [facility] [place]   In 3 months Army Melia, Jesse Sans, MD [facility], [facility]               Requested Prescriptions  Pending Prescriptions Disp Refills   triamcinolone cream (KENALOG) 0.1 % [Pharmacy Med Name: TRIAMCINOLONE ACETONIDE 0.1 % Cream] 80 g 0    Sig: APPLY TOPICALLY TWICE DAILY     Not Delegated - Dermatology:  Corticosteroids Failed - 09/14/2021 10:26 AM      Failed - This refill cannot be delegated      Passed - Valid encounter within last 12 months    Recent Outpatient Visits           2 weeks ago Annual physical exam   [facility] Glean Hess, MD   2 months ago Chronic idiopathic constipation   [facility] Glean Hess, MD   3 months ago Tremor   [facility] Glean Hess, MD   5 months ago Type II diabetes mellitus with complication [facility])   [facility] Glean Hess, MD   9 months ago Neoplasm of uncertain behavior of skin   [facility] Glean Hess, MD       Future Appointments             In 1 month Army Melia Jesse Sans, MD [facility], [place]   In 3 months Army Melia, Jesse Sans, MD [facility], [facility]

## 2021-09-16 ENCOUNTER — Ambulatory Visit: Payer: Medicare PPO | Admitting: Internal Medicine

## 2021-09-18 DIAGNOSIS — R002 Palpitations: Secondary | ICD-10-CM | POA: Diagnosis not present

## 2021-09-18 DIAGNOSIS — E119 Type 2 diabetes mellitus without complications: Secondary | ICD-10-CM | POA: Diagnosis not present

## 2021-09-18 DIAGNOSIS — Z794 Long term (current) use of insulin: Secondary | ICD-10-CM | POA: Diagnosis not present

## 2021-09-18 DIAGNOSIS — E782 Mixed hyperlipidemia: Secondary | ICD-10-CM | POA: Diagnosis not present

## 2021-09-18 DIAGNOSIS — I10 Essential (primary) hypertension: Secondary | ICD-10-CM | POA: Diagnosis not present

## 2021-10-16 ENCOUNTER — Ambulatory Visit: Payer: Medicare PPO | Admitting: Internal Medicine

## 2021-10-18 DIAGNOSIS — R002 Palpitations: Secondary | ICD-10-CM | POA: Diagnosis not present

## 2021-10-26 DIAGNOSIS — Z1331 Encounter for screening for depression: Secondary | ICD-10-CM | POA: Diagnosis not present

## 2021-10-26 DIAGNOSIS — E119 Type 2 diabetes mellitus without complications: Secondary | ICD-10-CM | POA: Diagnosis not present

## 2021-10-26 DIAGNOSIS — I10 Essential (primary) hypertension: Secondary | ICD-10-CM | POA: Diagnosis not present

## 2021-10-26 DIAGNOSIS — Z9181 History of falling: Secondary | ICD-10-CM | POA: Diagnosis not present

## 2021-11-03 ENCOUNTER — Other Ambulatory Visit: Payer: Self-pay

## 2021-11-03 NOTE — Patient Outreach (Signed)
[facility]) Care Management ? ?11/03/2021 ? ?[name] ?[birth year] ?[id number] ? ? ?Telephone Assessment ? ? ? ? ? ?Unsuccessful outreach attempt to patient at both numbers listed. ? ? ? ?Plan: ?RN CM will make outreach attempt within the month of May if no return call from patient. ? ? ?[name], RN,BSN,CCM ?[facility] Care Management ?Telephonic Care Management Coordinator ?Direct Phone: [phone] ?Toll Free: [phone] ?Fax: [phone] ? ?

## 2021-11-05 ENCOUNTER — Other Ambulatory Visit: Payer: Self-pay | Admitting: Internal Medicine

## 2021-11-05 ENCOUNTER — Telehealth: Payer: Self-pay

## 2021-11-05 NOTE — Telephone Encounter (Signed)
Copied from [place] [phone]. Topic: General - Other ?>> Nov 05, 2021 11:46 AM Rayann Heman wrote: ?Reason for CRM: Pt called and stated that she would like a call from Dr Army Melia. She states that she has to leave the office due to location and would like to tell her good bye. ?

## 2021-11-05 NOTE — Telephone Encounter (Signed)
Pt stated she can be reached on [phone] FYI ?

## 2021-11-05 NOTE — Telephone Encounter (Signed)
Please review.  KP

## 2021-11-06 ENCOUNTER — Telehealth: Payer: Self-pay | Admitting: Internal Medicine

## 2021-11-06 ENCOUNTER — Ambulatory Visit: Payer: Medicare PPO | Admitting: Internal Medicine

## 2021-11-06 NOTE — Telephone Encounter (Signed)
Spoke with pt. She just wanted to let us know she found another PCP closer to [place] and [place] ? ?

## 2021-11-06 NOTE — Telephone Encounter (Signed)
Copied from [place] [phone]. Topic: General - Other ?>> Nov 06, 2021 12:51 PM Valere Dross wrote: ?Reason for CRM: Pt called in requesting if Primitivo Gauze could give her a call back, pt didn't say for what but wanted to speak with her. Please advise. ?

## 2021-11-13 DIAGNOSIS — Z1231 Encounter for screening mammogram for malignant neoplasm of breast: Secondary | ICD-10-CM | POA: Diagnosis not present

## 2021-11-20 ENCOUNTER — Other Ambulatory Visit: Payer: Self-pay | Admitting: Internal Medicine

## 2021-11-20 DIAGNOSIS — R21 Rash and other nonspecific skin eruption: Secondary | ICD-10-CM

## 2021-11-20 NOTE — Telephone Encounter (Signed)
Requested medication (s) are due for refill today - yes ? ?Requested medication (s) are on the active medication list -yes ? ?Future visit scheduled -no ? ?Last refill: 09/05/21 80g ? ?Notes to clinic: Request RF: non delegated Rx ? ?Requested Prescriptions  ?Pending Prescriptions Disp Refills  ? triamcinolone cream (KENALOG) 0.1 % [Pharmacy Med Name: TRIAMCINOLONE ACETONIDE 0.1 % Cream] 80 g 0  ?  Sig: APPLY TOPICALLY TWICE DAILY  ?  ? Not Delegated - Dermatology:  Corticosteroids Failed - 11/20/2021 10:53 AM  ?  ?  Failed - This refill cannot be delegated  ?  ?  Passed - Valid encounter within last 12 months  ?  Recent Outpatient Visits   ? ?      ? 2 months ago Annual physical exam  ? [facility] Glean Hess, MD  ? 4 months ago Chronic idiopathic constipation  ? [facility] Glean Hess, MD  ? 5 months ago Tremor  ? [facility] Glean Hess, MD  ? 7 months ago Type II diabetes mellitus with complication [facility])  ? [facility] Glean Hess, MD  ? 12 months ago Neoplasm of uncertain behavior of skin  ? [facility] Glean Hess, MD  ? ?  ?  ? ? ?  ?  ?  ? ? ? ?Requested Prescriptions  ?Pending Prescriptions Disp Refills  ? triamcinolone cream (KENALOG) 0.1 % [Pharmacy Med Name: TRIAMCINOLONE ACETONIDE 0.1 % Cream] 80 g 0  ?  Sig: APPLY TOPICALLY TWICE DAILY  ?  ? Not Delegated - Dermatology:  Corticosteroids Failed - 11/20/2021 10:53 AM  ?  ?  Failed - This refill cannot be delegated  ?  ?  Passed - Valid encounter within last 12 months  ?  Recent Outpatient Visits   ? ?      ? 2 months ago Annual physical exam  ? [facility] Glean Hess, MD  ? 4 months ago Chronic idiopathic constipation  ? [facility] Glean Hess, MD  ? 5 months ago Tremor  ? [facility] Glean Hess, MD  ? 7 months ago Type II diabetes mellitus with complication [facility])  ? [facility] Glean Hess, MD  ? 12 months ago  Neoplasm of uncertain behavior of skin  ? [facility] Glean Hess, MD  ? ?  ?  ? ? ?  ?  ?  ? ? ? ?

## 2021-11-26 DIAGNOSIS — H02831 Dermatochalasis of right upper eyelid: Secondary | ICD-10-CM | POA: Diagnosis not present

## 2021-11-26 DIAGNOSIS — H02834 Dermatochalasis of left upper eyelid: Secondary | ICD-10-CM | POA: Diagnosis not present

## 2021-11-26 DIAGNOSIS — H18513 Endothelial corneal dystrophy, bilateral: Secondary | ICD-10-CM | POA: Diagnosis not present

## 2021-11-26 DIAGNOSIS — H2513 Age-related nuclear cataract, bilateral: Secondary | ICD-10-CM | POA: Diagnosis not present

## 2021-11-26 DIAGNOSIS — Z961 Presence of intraocular lens: Secondary | ICD-10-CM | POA: Diagnosis not present

## 2021-12-01 ENCOUNTER — Other Ambulatory Visit: Payer: Self-pay | Admitting: Internal Medicine

## 2021-12-02 NOTE — Telephone Encounter (Signed)
Refused the Zetia 10 mg because she is seeing another PCP that is closer to her home.   ?

## 2021-12-04 DIAGNOSIS — R519 Headache, unspecified: Secondary | ICD-10-CM | POA: Diagnosis not present

## 2021-12-04 DIAGNOSIS — Z9181 History of falling: Secondary | ICD-10-CM | POA: Diagnosis not present

## 2021-12-18 ENCOUNTER — Other Ambulatory Visit: Payer: Self-pay

## 2021-12-18 NOTE — Patient Outreach (Signed)
[place] [facility]) Care Management  12/18/2021  Sheila Sandoval October 09, 1945 [id number]   Case Closure    Patient has switched to new PCP ([facility]) not a part of [facility] providers.    Plan: RN CM will close case.   [name], RN,BSN,CCM [place] Management Telephonic Care Management Coordinator Direct Phone: [phone] Toll Free: [phone] Fax: [phone]

## 2021-12-21 ENCOUNTER — Other Ambulatory Visit: Payer: Self-pay | Admitting: Internal Medicine

## 2021-12-23 ENCOUNTER — Other Ambulatory Visit: Payer: Self-pay | Admitting: Internal Medicine

## 2021-12-23 NOTE — Telephone Encounter (Signed)
Requested medications are due for refill today.  yes  Requested medications are on the active medications list.  yes  Last refill. Ramipril 07/08/2021 #90 0 refills, Maxide 07/08/2021 #90 0 refills, Amlodipine 07/08/2021 #90 0 refills  Future visit scheduled.   no  Notes to clinic.  No PCP is listed.    Requested Prescriptions  Pending Prescriptions Disp Refills   ramipril (ALTACE) 5 MG capsule [Pharmacy Med Name: RAMIPRIL 5 MG Capsule] 90 capsule 0    Sig: TAKE 1 CAPSULE EVERY DAY     Cardiovascular:  ACE Inhibitors Passed - 12/21/2021 10:57 AM      Passed - Cr in normal range and within 180 days    Creatinine, Ser  Date Value Ref Range Status  09/01/2021 0.96 0.57 - 1.00 mg/dL Final         Passed - K in normal range and within 180 days    Potassium  Date Value Ref Range Status  09/01/2021 4.4 3.5 - 5.2 mmol/L Final         Passed - Patient is not pregnant      Passed - Last BP in normal range    BP Readings from Last 1 Encounters:  09/01/21 124/78         Passed - Valid encounter within last 6 months    Recent Outpatient Visits           3 months ago Annual physical exam   [facility]an [facility], [facility]   5 months ago Chronic idiopathic constipation   [facility] Glean Hess, [facility]   6 months ago Tremor   [facility] Glean Hess, MD   8 months ago Type II diabetes mellitus with complication [facility])   [facility] Glean Hess, MD   1 year ago Neoplasm of uncertain behavior of skin   [facility] Glean Hess, MD                triamterene-hydrochlorothiazide ([id number]) 37.5-25 MG tablet [Pharmacy Med Name: TRIAMTERENE/HYDROCHLOROTHIAZIDE 37.5-25 MG Tablet] 90 tablet 0    Sig: TAKE 1 TABLET EVERY DAY     Cardiovascular: Diuretic Combos Passed - 12/21/2021 10:57 AM      Passed - K in normal range and within 180 days    Potassium  Date Value Ref Range Status  09/01/2021 4.4 3.5 -  5.2 mmol/L Final         Passed - Na in normal range and within 180 days    Sodium  Date Value Ref Range Status  09/01/2021 140 134 - 144 mmol/L Final         Passed - Cr in normal range and within 180 days    Creatinine, Ser  Date Value Ref Range Status  09/01/2021 0.96 0.57 - 1.00 mg/dL Final         Passed - Last BP in normal range    BP Readings from Last 1 Encounters:  09/01/21 124/78         Passed - Valid encounter within last 6 months    Recent Outpatient Visits           3 months ago Annual physical exam   [facility]an [facility], [facility]   5 months ago Chronic idiopathic constipation   [facility] Glean Hess, MD   6 months ago Tremor   [facility] Glean Hess, MD   8 months ago  Type II diabetes mellitus with complication [facility])   [facility] Glean Hess, MD   1 year ago Neoplasm of uncertain behavior of skin   [facility] Glean Hess, MD                amLODipine ([place]) 10 MG tablet [Pharmacy Med Name: AMLODIPINE BESYLATE 10 MG Tablet] 90 tablet 0    Sig: TAKE 1 TABLET EVERY DAY     Cardiovascular: Calcium Channel Blockers 2 Passed - 12/21/2021 10:57 AM      Passed - Last BP in normal range    BP Readings from Last 1 Encounters:  09/01/21 124/78         Passed - Last Heart Rate in normal range    Pulse Readings from Last 1 Encounters:  09/01/21 64         Passed - Valid encounter within last 6 months    Recent Outpatient Visits           3 months ago Annual physical exam   [facility] Gle[facility], [facility]   5 months ago Chronic idiopathic constipation   [facility] Glean Hess, MD   6 months ago Tremor   [facility] Gle[facility] Hess, MD   8 months ago Type II diabetes mellitus with complication [facility]   [facility] Glean Hess, MD   1 year ago Neoplasm of uncertain behavior of skin   [facility]  Glean Hess, MD

## 2021-12-24 NOTE — Telephone Encounter (Signed)
Has new PCP closer to her home. Requested Prescriptions  Pending Prescriptions Disp Refills  . ACCU-CHEK GUIDE test strip [Pharmacy Med Name: ACCU-CHEK GUIDE   Strip] 300 strip 1    Sig: USE AS INSTRUCTED     Endocrinology: Diabetes - Testing Supplies Passed - 12/23/2021 10:37 AM      Passed - Valid encounter within last 12 months    Recent Outpatient Visits          3 months ago Annual physical exam   [facility] Glean Hess, MD   5 months ago Chronic idiopathic constipation   [facility] Glean Hess, MD   6 months ago Tremor   [facility] Glean [facility], MD   8 months ago Type II diabetes mellitus with complication [facility])   [facility] Gle[facility] Hess, MD   1 year ago Neoplasm of uncertain behavior of skin   [facility] Glean Hess, MD             . JANUMET 50-1000 MG tablet [Pharmacy Med Name: JANUMET 50-1000 MG Tablet] 180 tablet 0    Sig: TAKE 1 TABLET TWICE DAILY WITH MEALS     Endocrinology:  Diabetes - Biguanide + DPP-4 Inhibitor Combos Failed - 12/23/2021 10:37 AM      Failed - HBA1C is between 0 and 7.9 and within 180 days    Hgb A1c MFr Bld  Date Value Ref Range Status  09/01/2021 8.9 (H) 4.8 - 5.6 % Final    Comment:             Prediabetes: 5.7 - 6.4          Diabetes: >6.4          Glycemic control for adults with diabetes: <7.0          Failed - B12 Level in normal range and within 720 days    No results found for: VITAMINB12       Passed - Cr in normal range and within 360 days    Creatinine, Ser  Date Value Ref Range Status  09/01/2021 0.96 0.57 - 1.00 mg/dL Final         Passed - eGFR in normal range and within 360 days    GFR calc Af Amer  Date Value Ref Range Status  04/21/2020 55 (L) >59 mL/min/1.73 Final    Comment:    **[facility] currently reports eGFR in compliance with the current**   recommendations of the [employer]. [facility] will   update reporting  as new guidelines are published from the NKF-ASN   Task force.    GFR calc non Af Amer  Date Value Ref Range Status  04/21/2020 47 (L) >59 mL/min/1.73 Final   eGFR  Date Value Ref Range Status  09/01/2021 62 >59 mL/min/1.73 Final         Passed - Valid encounter within last 6 months    Recent Outpatient Visits          3 months ago Annual physical exam   [facility] Glean Hess, MD   5 months ago Chronic idiopathic constipation   [facility] Gle[facility], [facility]   6 months ago Tremor   [facility] Gle[facility] Hess, MD   8 months ago Type II diabetes mellitus with complication [facility])   [facility] Glean [facility], [facility]   1 year ago Neoplasm of uncertain behavior of skin  [facility] Glean Hess, [facility]             Passed - CBC within normal limits and completed in the last 12 months    WBC  Date Value Ref Range Status  09/01/2021 6.4 3.4 - 10.8 x10E3/uL Final   RBC  Date Value Ref Range Status  09/01/2021 5.62 (H) 3.77 - 5.28 x10E6/uL Final   Hemoglobin  Date Value Ref Range Status  09/01/2021 12.7 11.1 - 15.9 g/dL Final   Hematocrit  Date Value Ref Range Status  09/01/2021 40.5 34.0 - 46.6 % Final   MCHC  Date Value Ref Range Status  09/01/2021 31.4 (L) 31.5 - 35.7 g/dL Final   [facility]  Date Value Ref Range Status  09/01/2021 22.6 (L) 26.6 - 33.0 pg Final   MCV  Date Value Ref Range Status  09/01/2021 72 (L) 79 - 97 fL Final   No results found for: PLTCOUNTKUC, LABPLAT, POCPLA RDW  Date Value Ref Range Status  09/01/2021 15.3 11.7 - 15.4 % Final

## 2021-12-28 ENCOUNTER — Other Ambulatory Visit: Payer: Self-pay | Admitting: Internal Medicine

## 2021-12-29 NOTE — Telephone Encounter (Signed)
Pt no longer under prescriber's care

## 2021-12-30 ENCOUNTER — Ambulatory Visit: Payer: Medicare PPO | Admitting: Internal Medicine

## 2022-01-06 ENCOUNTER — Ambulatory Visit: Payer: Medicare PPO

## 2022-01-15 ENCOUNTER — Other Ambulatory Visit: Payer: Self-pay | Admitting: Internal Medicine

## 2022-01-15 DIAGNOSIS — R21 Rash and other nonspecific skin eruption: Secondary | ICD-10-CM

## 2022-02-04 DIAGNOSIS — M25511 Pain in right shoulder: Secondary | ICD-10-CM | POA: Diagnosis not present

## 2022-02-04 DIAGNOSIS — M1711 Unilateral primary osteoarthritis, right knee: Secondary | ICD-10-CM | POA: Diagnosis not present

## 2022-02-04 DIAGNOSIS — E119 Type 2 diabetes mellitus without complications: Secondary | ICD-10-CM | POA: Diagnosis not present

## 2022-02-04 DIAGNOSIS — M25561 Pain in right knee: Secondary | ICD-10-CM | POA: Diagnosis not present

## 2022-02-04 DIAGNOSIS — D510 Vitamin B12 deficiency anemia due to intrinsic factor deficiency: Secondary | ICD-10-CM | POA: Diagnosis not present

## 2022-02-04 DIAGNOSIS — M19011 Primary osteoarthritis, right shoulder: Secondary | ICD-10-CM | POA: Diagnosis not present

## 2022-02-08 DIAGNOSIS — M25561 Pain in right knee: Secondary | ICD-10-CM | POA: Diagnosis not present

## 2022-02-08 DIAGNOSIS — M542 Cervicalgia: Secondary | ICD-10-CM | POA: Diagnosis not present

## 2022-02-08 DIAGNOSIS — T1490XA Injury, unspecified, initial encounter: Secondary | ICD-10-CM | POA: Diagnosis not present

## 2022-02-08 DIAGNOSIS — Z87891 Personal history of nicotine dependence: Secondary | ICD-10-CM | POA: Diagnosis not present

## 2022-02-08 DIAGNOSIS — M25511 Pain in right shoulder: Secondary | ICD-10-CM | POA: Diagnosis not present

## 2022-02-08 DIAGNOSIS — M4312 Spondylolisthesis, cervical region: Secondary | ICD-10-CM | POA: Diagnosis not present

## 2022-02-19 DIAGNOSIS — M542 Cervicalgia: Secondary | ICD-10-CM | POA: Diagnosis not present

## 2022-02-19 DIAGNOSIS — G250 Essential tremor: Secondary | ICD-10-CM | POA: Diagnosis not present

## 2022-02-19 DIAGNOSIS — E119 Type 2 diabetes mellitus without complications: Secondary | ICD-10-CM | POA: Diagnosis not present

## 2022-03-24 ENCOUNTER — Other Ambulatory Visit: Payer: Self-pay

## 2022-03-24 NOTE — Patient Outreach (Signed)
[facility]) Care Management  03/24/2022  Sheila Sandoval 06/20/1946 [id number]   Care Coordination  Voicemail message received from patient. Return call placed to patient. She shares that she has been doing fairly well. Denies any acute issues or concerns at present. Patient states she did not need anything -was just checking in. Patient forgot that she was no longer getting RN CM calls through [facility] as she no longer sees [facility] providers. No further needs or concerns at this time.     [name], RN,BSN,CCM [facility] Telephonic Care Management Coordinator Direct Phone: [phone] Toll Free: [phone] Fax: [phone]

## 2022-04-06 DIAGNOSIS — M542 Cervicalgia: Secondary | ICD-10-CM | POA: Diagnosis not present

## 2022-05-21 DIAGNOSIS — D510 Vitamin B12 deficiency anemia due to intrinsic factor deficiency: Secondary | ICD-10-CM | POA: Diagnosis not present

## 2022-08-19 DIAGNOSIS — E119 Type 2 diabetes mellitus without complications: Secondary | ICD-10-CM | POA: Diagnosis not present

## 2022-08-19 DIAGNOSIS — H04123 Dry eye syndrome of bilateral lacrimal glands: Secondary | ICD-10-CM | POA: Diagnosis not present

## 2022-08-29 IMAGING — CR DG CERVICAL SPINE COMPLETE 4+V
6 series · 6 of 6 positions shown · non-contrast
Comparison: None.

CLINICAL DATA: Neck pain, spasm.  Neck muscle spasm.

EXAM:
CERVICAL SPINE - COMPLETE 4+ VIEW

[c-spine lat]
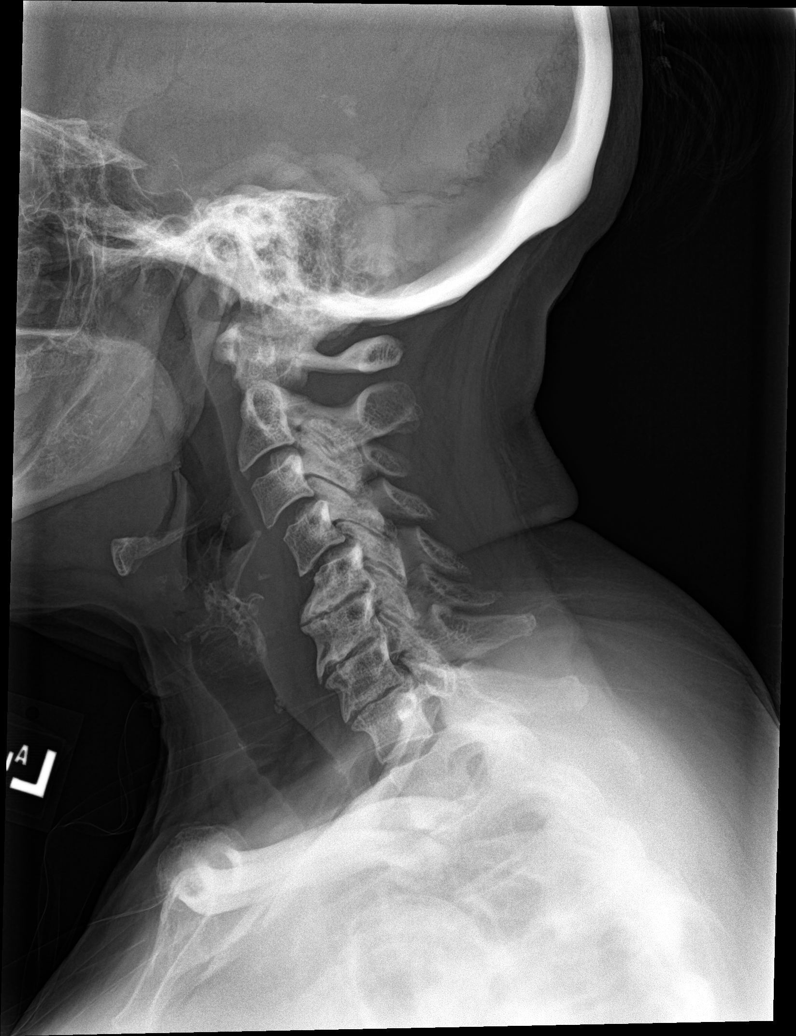

[c-spine obl (1 of 2)]
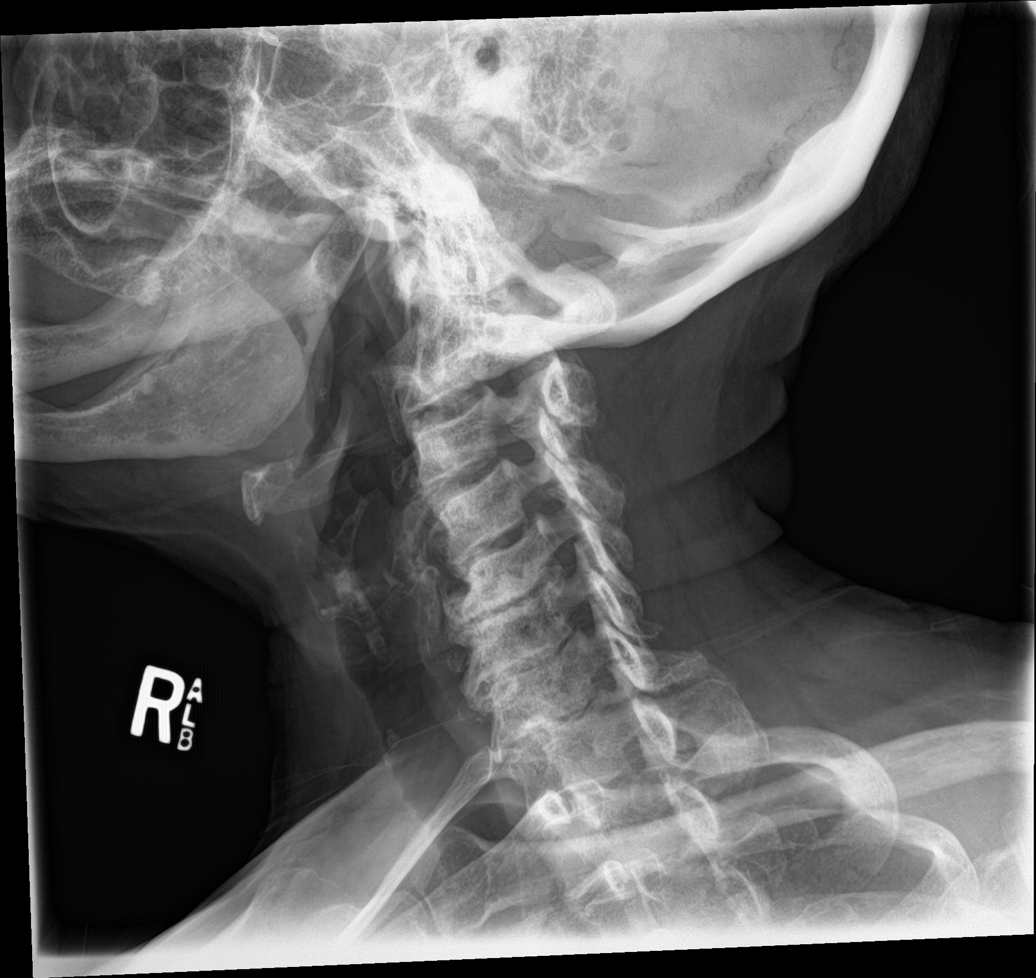

[c-spine obl (2 of 2)]
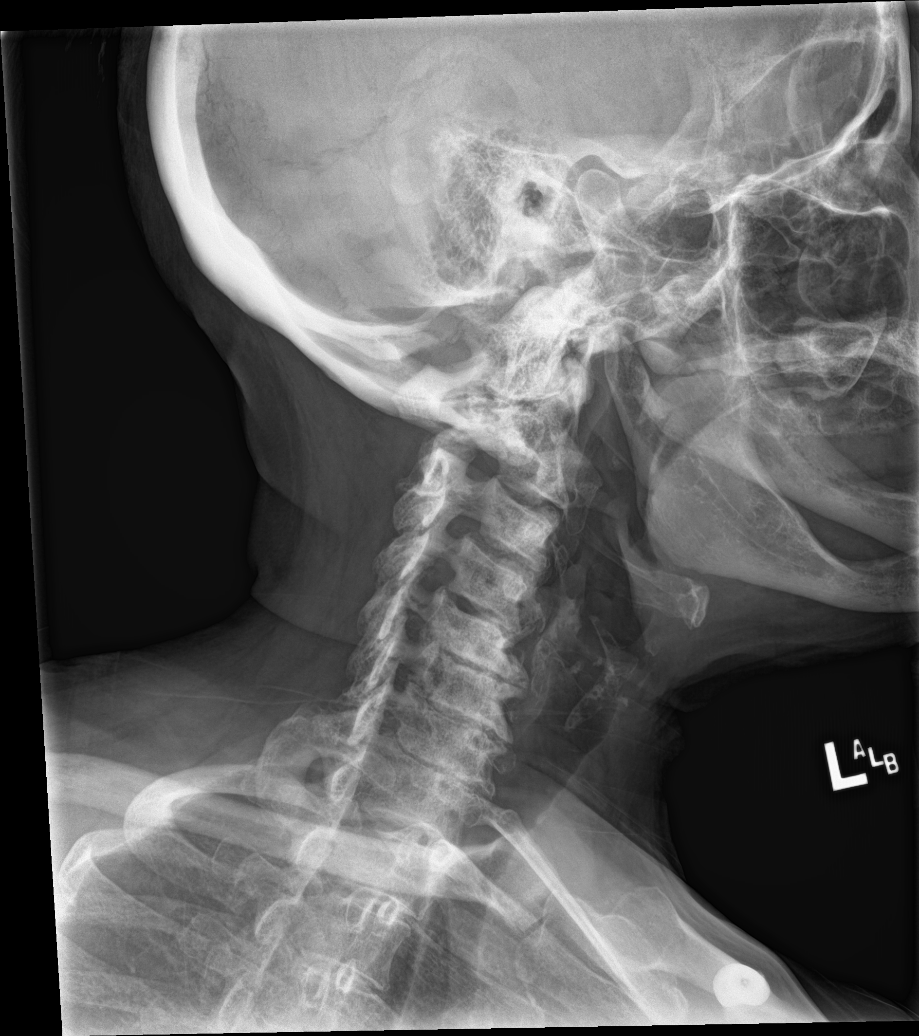

[c-spine ap]
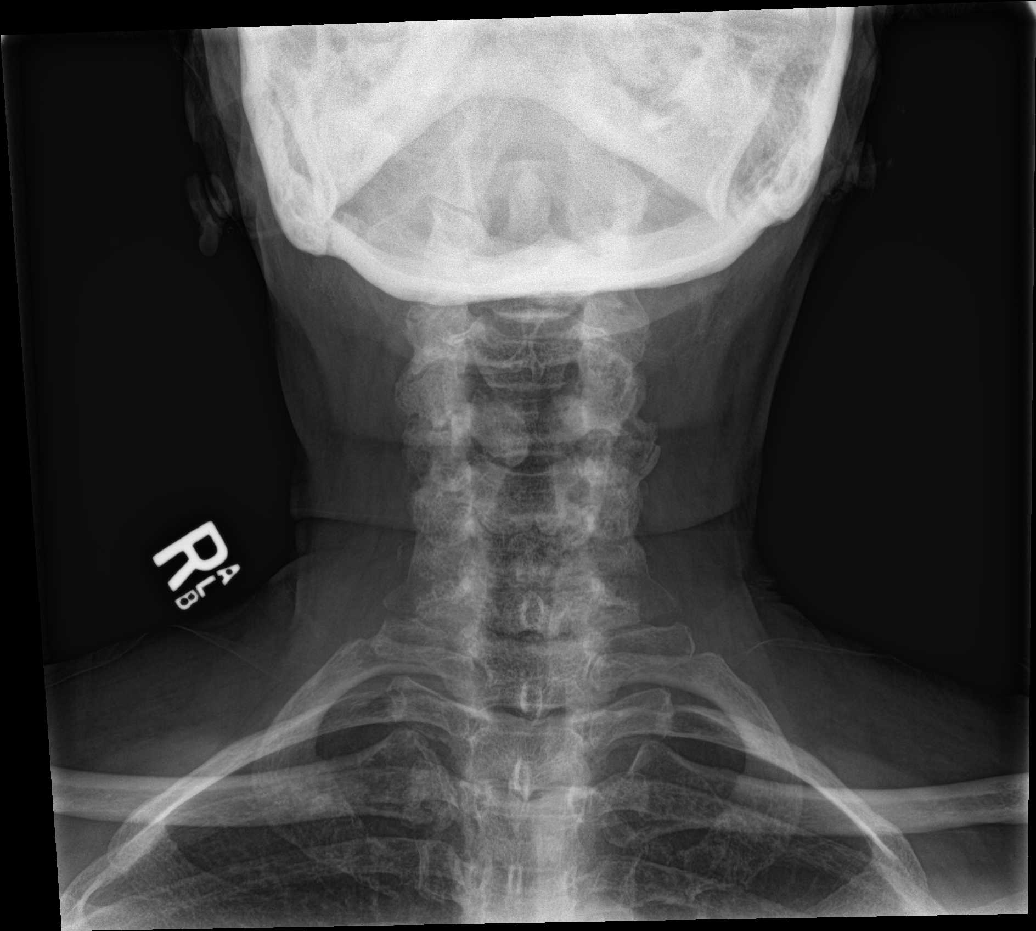

[c-spine open mouth]
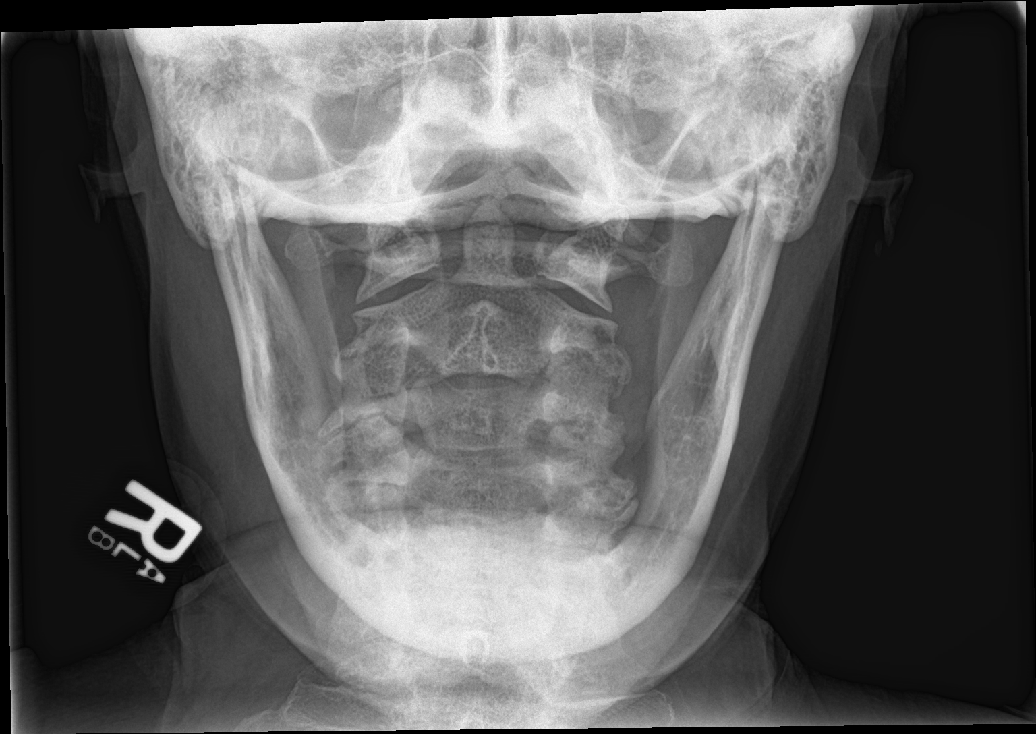

[[person_name]]
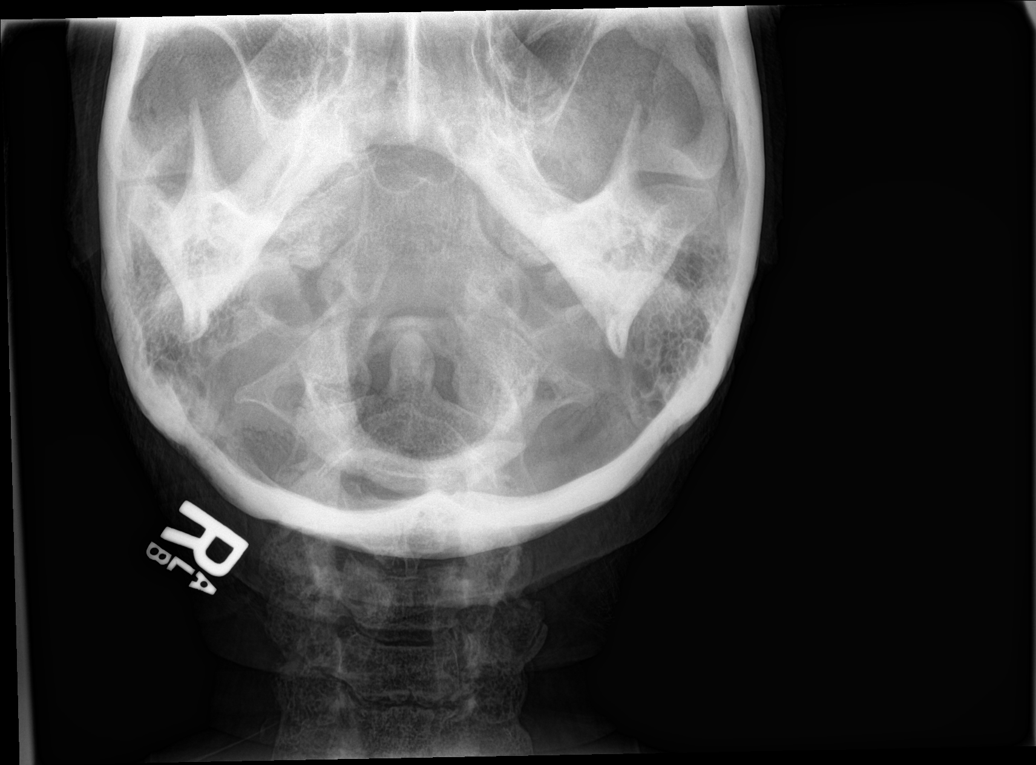

[6 of 6 positions shown; findings below may reference images not displayed]

FINDINGS: Straightening of normal lordosis. 2 mm anterolisthesis of C3 on C4.
3 mm anterolisthesis of C4 on C5. Disc space narrowing and endplate
spurring C5-C6, C6-C7, C7-T1 and C4-C5. There is diffuse multilevel
facet hypertrophy. Suspected mild bony neural foraminal narrowing at
C5-C6 bilaterally. No evidence of acute or healed fracture. The dens
is intact. No prevertebral soft tissue edema. Lung apices are clear.
IMPRESSION: 1. Multilevel degenerative disc disease and facet hypertrophy.
Suspected mild bony neural foraminal narrowing at C5-C6 bilaterally.
2. Straightening of normal lordosis. Mild anterolisthesis of C3 on
C4 and C4 on C5, facet mediated.

## 2022-08-31 DIAGNOSIS — Z7984 Long term (current) use of oral hypoglycemic drugs: Secondary | ICD-10-CM | POA: Diagnosis not present

## 2022-08-31 DIAGNOSIS — E119 Type 2 diabetes mellitus without complications: Secondary | ICD-10-CM | POA: Diagnosis not present

## 2022-09-13 DIAGNOSIS — M25552 Pain in left hip: Secondary | ICD-10-CM | POA: Diagnosis not present

## 2022-09-13 DIAGNOSIS — H2511 Age-related nuclear cataract, right eye: Secondary | ICD-10-CM | POA: Diagnosis not present

## 2022-09-13 DIAGNOSIS — H53413 Scotoma involving central area, bilateral: Secondary | ICD-10-CM | POA: Diagnosis not present

## 2022-09-13 DIAGNOSIS — E119 Type 2 diabetes mellitus without complications: Secondary | ICD-10-CM | POA: Diagnosis not present

## 2022-09-13 DIAGNOSIS — H02834 Dermatochalasis of left upper eyelid: Secondary | ICD-10-CM | POA: Diagnosis not present

## 2022-09-13 DIAGNOSIS — H02831 Dermatochalasis of right upper eyelid: Secondary | ICD-10-CM | POA: Diagnosis not present

## 2022-09-13 DIAGNOSIS — Z961 Presence of intraocular lens: Secondary | ICD-10-CM | POA: Diagnosis not present

## 2022-09-26 DIAGNOSIS — R079 Chest pain, unspecified: Secondary | ICD-10-CM | POA: Diagnosis not present

## 2022-09-26 DIAGNOSIS — Z87891 Personal history of nicotine dependence: Secondary | ICD-10-CM | POA: Diagnosis not present

## 2022-09-26 DIAGNOSIS — R000 Tachycardia, unspecified: Secondary | ICD-10-CM | POA: Diagnosis not present

## 2022-09-26 DIAGNOSIS — Z79899 Other long term (current) drug therapy: Secondary | ICD-10-CM | POA: Diagnosis not present

## 2022-09-26 DIAGNOSIS — R9431 Abnormal electrocardiogram [ECG] [EKG]: Secondary | ICD-10-CM | POA: Diagnosis not present

## 2022-09-26 DIAGNOSIS — R040 Epistaxis: Secondary | ICD-10-CM | POA: Diagnosis not present

## 2022-09-26 DIAGNOSIS — Z5181 Encounter for therapeutic drug level monitoring: Secondary | ICD-10-CM | POA: Diagnosis not present

## 2022-09-26 DIAGNOSIS — R0789 Other chest pain: Secondary | ICD-10-CM | POA: Diagnosis not present

## 2022-09-30 DIAGNOSIS — Z7982 Long term (current) use of aspirin: Secondary | ICD-10-CM | POA: Diagnosis not present

## 2022-09-30 DIAGNOSIS — R040 Epistaxis: Secondary | ICD-10-CM | POA: Diagnosis not present

## 2022-10-19 DIAGNOSIS — E1122 Type 2 diabetes mellitus with diabetic chronic kidney disease: Secondary | ICD-10-CM | POA: Diagnosis not present

## 2022-10-19 DIAGNOSIS — N1831 Chronic kidney disease, stage 3a: Secondary | ICD-10-CM | POA: Diagnosis not present

## 2022-10-19 DIAGNOSIS — E119 Type 2 diabetes mellitus without complications: Secondary | ICD-10-CM | POA: Diagnosis not present

## 2022-10-19 DIAGNOSIS — Z79899 Other long term (current) drug therapy: Secondary | ICD-10-CM | POA: Diagnosis not present

## 2023-02-04 ENCOUNTER — Telehealth: Payer: Self-pay | Admitting: Internal Medicine

## 2023-02-04 NOTE — Telephone Encounter (Signed)
Copied from CRM [id number]. Topic: General - Other >> Feb 04, 2023  2:55 PM Dominique A wrote: Reason for CRM: Pt is calling requesting to speak with Dr. Judithann Graves. Pt states she is in a situation where she is going from one clinic to another one. Please have Dr. Judithann Graves call the pt back.

## 2023-02-07 NOTE — Telephone Encounter (Signed)
Called patient and left VM informing her to call back. She is no longer a patient at our practice since she switched to another provider. In order for her to return to our clinic, she would have to schedule a new patient appt.
